# Patient Record
Sex: Female | Born: 1970 | Hispanic: No | Marital: Married | Smoking: Never smoker
Health system: Southern US, Community
[De-identification: ages and names within clinical notes are randomized; demographics above are authoritative.]

## PROBLEM LIST (undated history)

## (undated) DIAGNOSIS — N920 Excessive and frequent menstruation with regular cycle: Secondary | ICD-10-CM

## (undated) DIAGNOSIS — N951 Menopausal and female climacteric states: Secondary | ICD-10-CM

## (undated) DIAGNOSIS — N189 Chronic kidney disease, unspecified: Secondary | ICD-10-CM

## (undated) DIAGNOSIS — Z87442 Personal history of urinary calculi: Secondary | ICD-10-CM

## (undated) DIAGNOSIS — T7840XA Allergy, unspecified, initial encounter: Secondary | ICD-10-CM

## (undated) DIAGNOSIS — F419 Anxiety disorder, unspecified: Secondary | ICD-10-CM

## (undated) HISTORY — PX: WISDOM TOOTH EXTRACTION: SHX21

## (undated) HISTORY — DX: Menopausal and female climacteric states: N95.1

## (undated) HISTORY — DX: Allergy, unspecified, initial encounter: T78.40XA

## (undated) HISTORY — DX: Chronic kidney disease, unspecified: N18.9

## (undated) HISTORY — DX: Excessive and frequent menstruation with regular cycle: N92.0

---

## 2008-03-19 ENCOUNTER — Other Ambulatory Visit: Admission: RE | Admit: 2008-03-19 | Discharge: 2008-03-19 | Payer: Self-pay | Admitting: Obstetrics and Gynecology

## 2009-04-24 ENCOUNTER — Other Ambulatory Visit: Admission: RE | Admit: 2009-04-24 | Discharge: 2009-04-24 | Payer: Self-pay | Admitting: Obstetrics and Gynecology

## 2011-05-12 ENCOUNTER — Other Ambulatory Visit (HOSPITAL_COMMUNITY)
Admission: RE | Admit: 2011-05-12 | Discharge: 2011-05-12 | Disposition: A | Discharge status: Home / Self Care | Payer: BC Managed Care – PPO | Source: Ambulatory Visit | Attending: Obstetrics and Gynecology | Admitting: Obstetrics and Gynecology

## 2011-05-12 DIAGNOSIS — Z01419 Encounter for gynecological examination (general) (routine) without abnormal findings: Secondary | ICD-10-CM | POA: Insufficient documentation

## 2012-06-06 ENCOUNTER — Other Ambulatory Visit (HOSPITAL_COMMUNITY)
Admission: RE | Admit: 2012-06-06 | Discharge: 2012-06-06 | Disposition: A | Discharge status: Home / Self Care | Payer: BC Managed Care – PPO | Source: Ambulatory Visit | Attending: Obstetrics and Gynecology | Admitting: Obstetrics and Gynecology

## 2012-06-06 DIAGNOSIS — Z1151 Encounter for screening for human papillomavirus (HPV): Secondary | ICD-10-CM | POA: Insufficient documentation

## 2012-06-06 DIAGNOSIS — Z01419 Encounter for gynecological examination (general) (routine) without abnormal findings: Secondary | ICD-10-CM | POA: Insufficient documentation

## 2012-12-01 ENCOUNTER — Telehealth: Payer: Self-pay | Admitting: Physician Assistant

## 2012-12-01 NOTE — Telephone Encounter (Signed)
APPT MADE FOR SAT. 

## 2012-12-01 NOTE — Telephone Encounter (Signed)
Patient needs an appointment for a sinus infection.

## 2012-12-03 ENCOUNTER — Encounter: Payer: Self-pay | Admitting: Physician Assistant

## 2012-12-03 ENCOUNTER — Ambulatory Visit (INDEPENDENT_AMBULATORY_CARE_PROVIDER_SITE_OTHER): Payer: BC Managed Care – PPO | Admitting: Physician Assistant

## 2012-12-03 VITALS — BP 124/84 | HR 69 | Temp 98.9°F | Ht 64.0 in | Wt 177.0 lb

## 2012-12-03 DIAGNOSIS — J309 Allergic rhinitis, unspecified: Secondary | ICD-10-CM

## 2012-12-03 MED ORDER — AZITHROMYCIN 250 MG PO TABS: ORAL_TABLET | ORAL | Status: DC

## 2012-12-03 NOTE — Progress Notes (Signed)
  Subjective:    Patient ID: Michelle Sellers, female    DOB: Apr 08, 1971, 42 y.o.   MRN: [id number]  HPI On-going allergy problems over the past 3 years; nasal congestion, ear congestion; switched from allegra to zyrtec, but did not get improvement, resumed using allegra, using nasonex nasal spray once a day   Review of Systems  Constitutional: Positive for fatigue.  HENT: Positive for ear pain, congestion and sinus pressure. Negative for nosebleeds, rhinorrhea, sneezing, postnasal drip and tinnitus.   Eyes: Positive for itching.  All other systems reviewed and are negative.       Objective:   Physical Exam  Vitals reviewed. Constitutional: She appears well-developed and well-nourished.  HENT:  Head: Normocephalic and atraumatic.  Right Ear: External ear normal.  Left Ear: External ear normal.  Mouth/Throat: Oropharynx is clear and moist.  R>L nasal hypertrophy R>L TM retraction  Eyes: Conjunctivae and EOM are normal. Pupils are equal, round, and reactive to light.  Neck: Normal range of motion. Neck supple.  Cardiovascular: Normal rate, regular rhythm and normal heart sounds.   Pulmonary/Chest: Effort normal and breath sounds normal.          Assessment & Plan:  Allergic rhinitis  Orders Placed This Encounter  Procedures  . Ambulatory referral to Allergy    Referral Priority:  Routine    Referral Type:  Allergy Testing    Referral Reason:  Specialty Services Required    Requested Specialty:  Allergy    Number of Visits Requested:  1   Meds ordered this encounter  Medications  . fexofenadine (ALLEGRA) 180 MG tablet    Sig: Take 180 mg by mouth daily.  Marland Kitchen azithromycin (ZITHROMAX) 250 MG tablet    Sig: Take as directed    Dispense:  6 tablet    Refill:  0    Order Specific Question:  Supervising Provider    Answer:  Ernestina Penna [[id number]]

## 2013-06-08 ENCOUNTER — Ambulatory Visit (INDEPENDENT_AMBULATORY_CARE_PROVIDER_SITE_OTHER): Payer: BC Managed Care – PPO

## 2013-06-08 ENCOUNTER — Other Ambulatory Visit: Payer: BC Managed Care – PPO | Admitting: Adult Health

## 2013-06-08 ENCOUNTER — Ambulatory Visit (INDEPENDENT_AMBULATORY_CARE_PROVIDER_SITE_OTHER): Payer: BC Managed Care – PPO | Admitting: Family Medicine

## 2013-06-08 ENCOUNTER — Encounter: Payer: Self-pay | Admitting: Family Medicine

## 2013-06-08 VITALS — BP 129/93 | HR 62 | Temp 97.6°F | Ht 64.0 in | Wt 169.0 lb

## 2013-06-08 DIAGNOSIS — R079 Chest pain, unspecified: Secondary | ICD-10-CM

## 2013-06-08 LAB — POCT GLYCOSYLATED HEMOGLOBIN (HGB A1C): Hemoglobin A1C: 4.7

## 2013-06-08 MED ORDER — GI COCKTAIL ~~LOC~~
30.0000 mL | Freq: Once | ORAL | Status: AC
  Administered 2013-06-08: 30 mL via ORAL

## 2013-06-08 NOTE — Progress Notes (Addendum)
  Subjective:    Patient ID: Michelle Sellers, female    DOB: 1971-02-15, 42 y.o.   MRN: [id number]  HPI Pt presents today with chief complaint of chest pain  Pt reports chest pain x 3 days  CP is constant. L of center of some radiation to neck. No shoulder radiation. No assd nausea or diaphoresis.  Pain 6/10 at its worst.  Sometimes worse with movement. Not associated with exertion.  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  Pt denies any reflux sxs.   CV: family hx/o MI in father in early 57s, otherwise no RFs.     Review of Systems  All other systems reviewed and are negative.       Objective:   Physical Exam  Constitutional: She appears well-developed and well-nourished.  HENT:  Head: Normocephalic and atraumatic.  Eyes: Conjunctivae are normal. Pupils are equal, round, and reactive to light.  Neck: Normal range of motion.  Cardiovascular: Normal rate, regular rhythm and normal heart sounds.   Pulmonary/Chest: Effort normal and breath sounds normal.  + Anterior chest wall TTP    Abdominal: Soft.  Musculoskeletal: Normal range of motion.  Neurological: She is alert.  Skin: Skin is warm.   [facility] reading (PRIMARY) by  Dr. Franki Cabot her chest x-ray read negative for any focal infiltrate abnormality.                                  EKG: NSR        Assessment & Plan:  Chest pain - Plan: EKG 12-Lead, DG Chest 2 View, CBC with Differential, TSH, POCT glycosylated hemoglobin (Hb A1C), Troponin I, NMR Lipoprofile with Lipids, Comprehensive metabolic panel  Symptoms fairly atypical. CXR and EKG WNL. Differential diagnosis includes costochondritis GERD. No relief in symptoms with GI cocktail. Likely costochondritis given reproducible pain with palpation as well as movement. We'll check baseline risk stratification labs given family history of early heart disease. Labs include TSH, A1c, lipid  profile. Check one set of troponins. Discussed general care musculoskeletal red flags. Handout given. Followup as needed.

## 2013-06-08 NOTE — Patient Instructions (Signed)
Costochondritis Costochondritis (Tietze syndrome), or costochondral separation, is a swelling and irritation (inflammation) of the tissue (cartilage) that connects your ribs with your breastbone (sternum). It may occur on its own (spontaneously), through damage caused by an accident (trauma), or simply from coughing or minor exercise. It may take up to 6 weeks to get better and longer if you are unable to be conservative in your activities. HOME CARE INSTRUCTIONS   Avoid exhausting physical activity. Try not to strain your ribs during normal activity. This would include any activities using chest, belly (abdominal), and side muscles, especially if heavy weights are used.  Use ice for 15-20 minutes per hour while awake for the first 2 days. Place the ice in a plastic bag, and place a towel between the bag of ice and your skin.  Only take over-the-counter or prescription medicines for pain, discomfort, or fever as directed by your caregiver. SEEK IMMEDIATE MEDICAL CARE IF:   Your pain increases or you are very uncomfortable.  You have a fever.  You develop difficulty with your breathing.  You cough up blood.  You develop worse chest pains, shortness of breath, sweating, or vomiting.  You develop new, unexplained problems (symptoms). MAKE SURE YOU:   Understand these instructions.  Will watch your condition.  Will get help right away if you are not doing well or get worse. Document Released: 06/03/2005 Document Revised: 11/16/2011 Document Reviewed: 04/11/2008 Exit[employer] Patient Information 2014 [employer].  

## 2013-06-10 LAB — NMR, LIPOPROFILE
HDL Particle Number: 31.6 umol/L (ref >=30.5)
LDL Size: 20.3 nm — ABNORMAL LOW (ref >20.5)
LDLC SERPL CALC-MCNC: 108 mg/dL — ABNORMAL HIGH (ref <100)
LP-IR Score: 45 (ref <=45)
Small LDL Particle Number: 1007 nmol/L — ABNORMAL HIGH (ref <=527)

## 2013-06-10 LAB — CBC WITH DIFFERENTIAL/PLATELET
Eos: 1 %
HCT: 39.6 % (ref 34.0–46.6)
Hemoglobin: 13 g/dL (ref 11.1–15.9)
Lymphocytes Absolute: 2 10*3/uL (ref 0.7–3.1)
MCHC: 32.8 g/dL (ref 31.5–35.7)
Monocytes: 6 %
Neutrophils Absolute: 4.3 10*3/uL (ref 1.4–7.0)
WBC: 6.8 10*3/uL (ref 3.4–10.8)

## 2013-06-10 LAB — TROPONIN I: Troponin I: <0.01 ng/mL (ref 0.00–0.04)

## 2013-06-10 LAB — COMPREHENSIVE METABOLIC PANEL
Albumin: 4.6 g/dL (ref 3.5–5.5)
BUN: 14 mg/dL (ref 6–24)
CO2: 23 mmol/L (ref 18–29)
Calcium: 9.2 mg/dL (ref 8.7–10.2)
Creatinine, Ser: 0.79 mg/dL (ref 0.57–1.00)
Globulin, Total: 2.3 g/dL (ref 1.5–4.5)
Glucose: 80 mg/dL (ref 65–99)

## 2013-06-14 ENCOUNTER — Encounter: Payer: Self-pay | Admitting: *Deleted

## 2013-06-14 NOTE — Progress Notes (Unsigned)
Patient ID: Michelle Sellers, female   DOB: 1971/06/12, 41 y.o.   MRN: [id number] Pt came by today to go over labs, and she wanted bp rck due to elevated bp on 06-08-13. Today was even higher. Can you address and have nurse call the patient at [phone]. Pharm is [facility] [place], [place]  hctz 25 mg called in to pharm on Wednesday the 8th of oct. Per dr Alvester Morin order  Pt to f/u with [facility] this week for complete ck up.- jhb

## 2013-06-15 ENCOUNTER — Ambulatory Visit (INDEPENDENT_AMBULATORY_CARE_PROVIDER_SITE_OTHER): Payer: BC Managed Care – PPO | Admitting: *Deleted

## 2013-06-15 VITALS — BP 118/83 | HR 71

## 2013-06-15 DIAGNOSIS — Z0130 Encounter for examination of blood pressure without abnormal findings: Secondary | ICD-10-CM

## 2013-06-15 DIAGNOSIS — Z136 Encounter for screening for cardiovascular disorders: Secondary | ICD-10-CM

## 2013-06-15 NOTE — Progress Notes (Signed)
Patient ID: Michelle Sellers, female   DOB: [birth year], 42 y.o.   MRN: [id number] Pt came in for bp check Ready was good has appt with [facility] nex week

## 2013-06-21 ENCOUNTER — Ambulatory Visit (INDEPENDENT_AMBULATORY_CARE_PROVIDER_SITE_OTHER): Payer: BC Managed Care – PPO | Admitting: Family Medicine

## 2013-06-21 ENCOUNTER — Encounter: Payer: Self-pay | Admitting: Family Medicine

## 2013-06-21 VITALS — BP 120/88 | HR 67 | Temp 98.1°F | Ht 64.0 in | Wt 170.2 lb

## 2013-06-21 DIAGNOSIS — E785 Hyperlipidemia, unspecified: Secondary | ICD-10-CM

## 2013-06-21 NOTE — Progress Notes (Signed)
  Subjective:    Patient ID: Michelle Sellers, female    DOB: Aug 24, 1971, 42 y.o.   MRN: [id number]  HPI Pt presents today for lab follow up  Pt was recently seen for costochondritis.  Also had risk stratification labs done Had lipoprofile done showing elevated LDL, chol and low HDL.  Pt would like to discuss options for management of cholesterol.  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  Does report eating pizza as well as eating salads with heavy olive oils on a regular basis. Otherwise, pt states that she avoids greasy food.   Also with family hx/o HLD.       Review of Systems  All other systems reviewed and are negative.       Objective:   Physical Exam  Constitutional: She appears well-developed and well-nourished.  HENT:  Head: Normocephalic and atraumatic.  Eyes: Conjunctivae are normal. Pupils are equal, round, and reactive to light.  Neck: Normal range of motion.  Cardiovascular: Normal rate and regular rhythm.   Pulmonary/Chest: Effort normal and breath sounds normal.  Abdominal: Soft.  Musculoskeletal: Normal range of motion.  Neurological: She is alert.  Skin: Skin is warm.          Assessment & Plan:  HLD (hyperlipidemia)  Had a relatively lengthy discusssion with in terms of treatment options.  Would like aggressive diet and lifestyle changes over next 2-3 months with reassessment pending.  Discussed general care and importance of low-fat, plant based diet as well as regular aerobic exercise.  Follow up in 2-3 months.

## 2013-06-21 NOTE — Patient Instructions (Signed)
Hypertriglyceridemia  Diet for High blood levels of Triglycerides Most fats in food are triglycerides. Triglycerides in your blood are stored as fat in your body. High levels of triglycerides in your blood may put you at a greater risk for heart disease and stroke.  Normal triglyceride levels are less than 150 mg/dL. Borderline high levels are 150-199 mg/dl. High levels are 200 - 499 mg/dL, and very high triglyceride levels are greater than 500 mg/dL. The decision to treat high triglycerides is generally based on the level. For people with borderline or high triglyceride levels, treatment includes weight loss and exercise. Drugs are recommended for people with very high triglyceride levels. Many people who need treatment for high triglyceride levels have metabolic syndrome. This syndrome is a collection of disorders that often include: insulin resistance, high blood pressure, blood clotting problems, high cholesterol and triglycerides. TESTING PROCEDURE FOR TRIGLYCERIDES  You should not eat 4 hours before getting your triglycerides measured. The normal range of triglycerides is between 10 and 250 milligrams per deciliter (mg/dl). Some people may have extreme levels (1000 or above), but your triglyceride level may be too high if it is above 150 mg/dl, depending on what other risk factors you have for heart disease.  People with high blood triglycerides may also have high blood cholesterol levels. If you have high blood cholesterol as well as high blood triglycerides, your risk for heart disease is probably greater than if you only had high triglycerides. High blood cholesterol is one of the main risk factors for heart disease. CHANGING YOUR DIET  Your weight can affect your blood triglyceride level. If you are more than 20% above your ideal body weight, you may be able to lower your blood triglycerides by losing weight. Eating less and exercising regularly is the best way to combat this. Fat provides more  calories than any other food. The best way to lose weight is to eat less fat. Only 30% of your total calories should come from fat. Less than 7% of your diet should come from saturated fat. A diet low in fat and saturated fat is the same as a diet to decrease blood cholesterol. By eating a diet lower in fat, you may lose weight, lower your blood cholesterol, and lower your blood triglyceride level.  Eating a diet low in fat, especially saturated fat, may also help you lower your blood triglyceride level. Ask your dietitian to help you figure how much fat you can eat based on the number of calories your caregiver has prescribed for you.  Exercise, in addition to helping with weight loss may also help lower triglyceride levels.   Alcohol can increase blood triglycerides. You may need to stop drinking alcoholic beverages.  Too much carbohydrate in your diet may also increase your blood triglycerides. Some complex carbohydrates are necessary in your diet. These may include bread, rice, potatoes, other starchy vegetables and cereals.  Reduce "simple" carbohydrates. These may include pure sugars, candy, honey, and jelly without losing other nutrients. If you have the kind of high blood triglycerides that is affected by the amount of carbohydrates in your diet, you will need to eat less sugar and less high-sugar foods. Your caregiver can help you with this.  Adding 2-4 grams of fish oil (EPA+ DHA) may also help lower triglycerides. Speak with your caregiver before adding any supplements to your regimen. Following the Diet  Maintain your ideal weight. Your caregivers can help you with a diet. Generally, eating less food and getting more   exercise will help you lose weight. Joining a weight control group may also help. Ask your caregivers for a good weight control group in your area.  Eat low-fat foods instead of high-fat foods. This can help you lose weight too.  These foods are lower in fat. Eat MORE of these:    Dried beans, peas, and lentils.  Egg whites.  Low-fat cottage cheese.  Fish.  Lean cuts of meat, such as round, sirloin, rump, and flank (cut extra fat off meat you fix).  Whole grain breads, cereals and pasta.  Skim and nonfat dry milk.  Low-fat yogurt.  Poultry without the skin.  Cheese made with skim or part-skim milk, such as mozzarella, parmesan, farmers', ricotta, or pot cheese. These are higher fat foods. Eat LESS of these:   Whole milk and foods made from whole milk, such as American, blue, cheddar, monterey jack, and swiss cheese  High-fat meats, such as luncheon meats, sausages, knockwurst, bratwurst, hot dogs, ribs, corned beef, ground pork, and regular ground beef.  Fried foods. Limit saturated fats in your diet. Substituting unsaturated fat for saturated fat may decrease your blood triglyceride level. You will need to read package labels to know which products contain saturated fats.  These foods are high in saturated fat. Eat LESS of these:   Fried pork skins.  Whole milk.  Skin and fat from poultry.  Palm oil.  Butter.  Shortening.  Cream cheese.  Bacon.  Margarines and baked goods made from listed oils.  Vegetable shortenings.  Chitterlings.  Fat from meats.  Coconut oil.  Palm kernel oil.  Lard.  Cream.  Sour cream.  Fatback.  Coffee whiteners and non-dairy creamers made with these oils.  Cheese made from whole milk. Use unsaturated fats (both polyunsaturated and monounsaturated) moderately. Remember, even though unsaturated fats are better than saturated fats; you still want a diet low in total fat.  These foods are high in unsaturated fat:   Canola oil.  Sunflower oil.  Mayonnaise.  Almonds.  Peanuts.  Pine nuts.  Margarines made with these oils.  Safflower oil.  Olive oil.  Avocados.  Cashews.  Peanut butter.  Sunflower seeds.  Soybean oil.  Peanut  oil.  Olives.  Pecans.  Walnuts.  Pumpkin seeds. Avoid sugar and other high-sugar foods. This will decrease carbohydrates without decreasing other nutrients. Sugar in your food goes rapidly to your blood. When there is excess sugar in your blood, your liver may use it to make more triglycerides. Sugar also contains calories without other important nutrients.  Eat LESS of these:   Sugar, brown sugar, powdered sugar, jam, jelly, preserves, honey, syrup, molasses, pies, candy, cakes, cookies, frosting, pastries, colas, soft drinks, punches, fruit drinks, and regular gelatin.  Avoid alcohol. Alcohol, even more than sugar, may increase blood triglycerides. In addition, alcohol is high in calories and low in nutrients. Ask for sparkling water, or a diet soft drink instead of an alcoholic beverage. Suggestions for planning and preparing meals   Bake, broil, grill or roast meats instead of frying.  Remove fat from meats and skin from poultry before cooking.  Add spices, herbs, lemon juice or vinegar to vegetables instead of salt, rich sauces or gravies.  Use a non-stick skillet without fat or use no-stick sprays.  Cool and refrigerate stews and broth. Then remove the hardened fat floating on the surface before serving.  Refrigerate meat drippings and skim off fat to make low-fat gravies.  Serve more fish.  Use less butter,   margarine and other high-fat spreads on bread or vegetables.  Use skim or reconstituted non-fat dry milk for cooking.  Cook with low-fat cheeses.  Substitute low-fat yogurt or cottage cheese for all or part of the sour cream in recipes for sauces, dips or congealed salads.  Use half yogurt/half mayonnaise in salad recipes.  Substitute evaporated skim milk for cream. Evaporated skim milk or reconstituted non-fat dry milk can be whipped and substituted for whipped cream in certain recipes.  Choose fresh fruits for dessert instead of high-fat foods such as pies or  cakes. Fruits are naturally low in fat. When Dining Out   Order low-fat appetizers such as fruit or vegetable juice, pasta with vegetables or tomato sauce.  Select clear, rather than cream soups.  Ask that dressings and gravies be served on the side. Then use less of them.  Order foods that are baked, broiled, poached, steamed, stir-fried, or roasted.  Ask for margarine instead of butter, and use only a small amount.  Drink sparkling water, unsweetened tea or coffee, or diet soft drinks instead of alcohol or other sweet beverages. QUESTIONS AND ANSWERS ABOUT OTHER FATS IN THE BLOOD: SATURATED FAT, TRANS FAT, AND CHOLESTEROL What is trans fat? Trans fat is a type of fat that is formed when vegetable oil is hardened through a process called hydrogenation. This process helps makes foods more solid, gives them shape, and prolongs their shelf life. Trans fats are also called hydrogenated or partially hydrogenated oils.  What do saturated fat, trans fat, and cholesterol in foods have to do with heart disease? Saturated fat, trans fat, and cholesterol in the diet all raise the level of LDL "bad" cholesterol in the blood. The higher the LDL cholesterol, the greater the risk for coronary heart disease (CHD). Saturated fat and trans fat raise LDL similarly.  What foods contain saturated fat, trans fat, and cholesterol? High amounts of saturated fat are found in animal products, such as fatty cuts of meat, chicken skin, and full-fat dairy products like butter, whole milk, cream, and cheese, and in tropical vegetable oils such as palm, palm kernel, and coconut oil. Trans fat is found in some of the same foods as saturated fat, such as vegetable shortening, some margarines (especially hard or stick margarine), crackers, cookies, baked goods, fried foods, salad dressings, and other processed foods made with partially hydrogenated vegetable oils. Small amounts of trans fat also occur naturally in some animal  products, such as milk products, beef, and lamb. Foods high in cholesterol include liver, other organ meats, egg yolks, shrimp, and full-fat dairy products. How can I use the new food label to make heart-healthy food choices? Check the Nutrition Facts panel of the food label. Choose foods lower in saturated fat, trans fat, and cholesterol. For saturated fat and cholesterol, you can also use the Percent Daily Value (%DV): 5% DV or less is low, and 20% DV or more is high. (There is no %DV for trans fat.) Use the Nutrition Facts panel to choose foods low in saturated fat and cholesterol, and if the trans fat is not listed, read the ingredients and limit products that list shortening or hydrogenated or partially hydrogenated vegetable oil, which tend to be high in trans fat. POINTS TO REMEMBER:   Discuss your risk for heart disease with your caregivers, and take steps to reduce risk factors.  Change your diet. Choose foods that are low in saturated fat, trans fat, and cholesterol.  Add exercise to your daily routine if   it is not already being done. Participate in physical activity of moderate intensity, like brisk walking, for at least 30 minutes on most, and preferably all days of the week. No time? Break the 30 minutes into three, 10-minute segments during the day.  Stop smoking. If you do smoke, contact your caregiver to discuss ways in which they can help you quit.  Do not use street drugs.  Maintain a normal weight.  Maintain a healthy blood pressure.  Keep up with your blood work for checking the fats in your blood as directed by your caregiver. Document Released: 06/11/2004 Document Revised: 02/23/2012 Document Reviewed: 01/07/2009 [employer] Patient Information 2014 [employer].  

## 2013-07-04 ENCOUNTER — Other Ambulatory Visit: Payer: BC Managed Care – PPO | Admitting: Advanced Practice Midwife

## 2013-07-17 ENCOUNTER — Ambulatory Visit (INDEPENDENT_AMBULATORY_CARE_PROVIDER_SITE_OTHER): Payer: BC Managed Care – PPO | Admitting: Family Medicine

## 2013-07-17 ENCOUNTER — Encounter: Payer: Self-pay | Admitting: Family Medicine

## 2013-07-17 VITALS — BP 134/89 | HR 67 | Temp 99.3°F | Ht 64.0 in | Wt 168.8 lb

## 2013-07-17 DIAGNOSIS — J069 Acute upper respiratory infection, unspecified: Secondary | ICD-10-CM

## 2013-07-17 DIAGNOSIS — R300 Dysuria: Secondary | ICD-10-CM

## 2013-07-17 LAB — POCT URINALYSIS DIPSTICK
Bilirubin, UA: NEGATIVE
Glucose, UA: NEGATIVE
Ketones, UA: NEGATIVE
Nitrite, UA: NEGATIVE
Protein, UA: NEGATIVE
Spec Grav, UA: 1.015
Urobilinogen, UA: NEGATIVE
pH, UA: 7

## 2013-07-17 LAB — POCT UA - MICROSCOPIC ONLY
Bacteria, U Microscopic: NEGATIVE
Casts, Ur, LPF, POC: NEGATIVE
Crystals, Ur, HPF, POC: NEGATIVE
Mucus, UA: NEGATIVE
Yeast, UA: NEGATIVE

## 2013-07-17 MED ORDER — SULFAMETHOXAZOLE-TMP DS 800-160 MG PO TABS: 1.0000 | ORAL_TABLET | Freq: Two times a day (BID) | ORAL | Status: DC

## 2013-07-17 NOTE — Progress Notes (Signed)
  Subjective:    Patient ID: Michelle Sellers, female    DOB: November 29, 1970, 42 y.o.   MRN: [id number]  HPI  This 42 y.o. female presents for evaluation of dysuria for a couple days.  She is having Back pain and she is having some sinus drainage.  She reports being dizzy.  She takes Allegra daily and she states it is not helping..  Review of Systems    No chest pain, SOB, HA, dizziness, vision change, N/V, diarrhea, constipation, dysuria, urinary urgency or frequency, myalgias, arthralgias or rash.  Objective:   Physical Exam  Vital signs noted  Well developed well nourished female.  HEENT - Head atraumatic Normocephalic                Eyes - PERRLA, Conjuctiva - clear Sclera- Clear EOMI                Ears - EAC's Wnl TM's Wnl Gross Hearing WNL                Nose - Nares patent                 Throat - oropharanx wnl Respiratory - Lungs CTA bilateral Cardiac - RRR S1 and S2 without murmur GI - Abdomen soft Nontender and bowel sounds active x 4 Extremities - No edema. Neuro - Grossly intact.      Assessment & Plan:  Dysuria - Plan: POCT UA - Microscopic Only, POCT urinalysis dipstick, sulfamethoxazole-trimethoprim (BACTRIM DS) 800-160 MG per tablet  URI, acute - Plan: sulfamethoxazole-trimethoprim (BACTRIM DS) 800-160 MG per tablet  Deatra Canter FNP

## 2013-07-17 NOTE — Patient Instructions (Signed)

## 2013-08-02 ENCOUNTER — Other Ambulatory Visit (HOSPITAL_COMMUNITY)
Admission: RE | Admit: 2013-08-02 | Discharge: 2013-08-02 | Disposition: A | Discharge status: Home / Self Care | Payer: BC Managed Care – PPO | Source: Ambulatory Visit | Attending: Advanced Practice Midwife | Admitting: Advanced Practice Midwife

## 2013-08-02 ENCOUNTER — Ambulatory Visit (INDEPENDENT_AMBULATORY_CARE_PROVIDER_SITE_OTHER): Payer: BC Managed Care – PPO | Admitting: Advanced Practice Midwife

## 2013-08-02 ENCOUNTER — Encounter: Payer: Self-pay | Admitting: Advanced Practice Midwife

## 2013-08-02 ENCOUNTER — Encounter (INDEPENDENT_AMBULATORY_CARE_PROVIDER_SITE_OTHER): Payer: Self-pay

## 2013-08-02 VITALS — BP 114/84 | Ht 64.0 in | Wt 171.0 lb

## 2013-08-02 DIAGNOSIS — I10 Essential (primary) hypertension: Secondary | ICD-10-CM | POA: Insufficient documentation

## 2013-08-02 DIAGNOSIS — Z1151 Encounter for screening for human papillomavirus (HPV): Secondary | ICD-10-CM | POA: Insufficient documentation

## 2013-08-02 DIAGNOSIS — Z1212 Encounter for screening for malignant neoplasm of rectum: Secondary | ICD-10-CM

## 2013-08-02 DIAGNOSIS — Z01419 Encounter for gynecological examination (general) (routine) without abnormal findings: Secondary | ICD-10-CM | POA: Insufficient documentation

## 2013-08-02 DIAGNOSIS — E785 Hyperlipidemia, unspecified: Secondary | ICD-10-CM | POA: Insufficient documentation

## 2013-08-02 NOTE — Progress Notes (Signed)
Michelle Sellers 42 y.o.  Filed Vitals:   08/02/13 0938  BP: 114/84     Past Medical History: Past Medical History  Diagnosis Date  . Allergy     Past Surgical History: History reviewed. No pertinent past surgical history.  Family History: Family History  Problem Relation Age of Onset  . Hyperlipidemia Mother   . Hypertension Mother   . Heart disease Father   . Hyperlipidemia Father   . Hypertension Father     Social History: History  Substance Use Topics  . Smoking status: Never Smoker   . Smokeless tobacco: Not on file  . Alcohol Use: 0.6 oz/week    1 Glasses of wine per week    Allergies: No Known Allergies     Current outpatient prescriptions:Cholecalciferol (VITAMIN D PO), Take by mouth 1 day or 1 dose., Disp: , Rfl: ;  Omega-3 Fatty Acids (FISH OIL PO), Take by mouth 1 day or 1 dose., Disp: , Rfl: ;  hydrochlorothiazide (HYDRODIURIL) 25 MG tablet, Take 25 mg by mouth daily., Disp: , Rfl: ;  sulfamethoxazole-trimethoprim (BACTRIM DS) 800-160 MG per tablet, Take 1 tablet by mouth 2 (two) times daily., Disp: 20 tablet, Rfl: 0  History of Present Illness:  Was placed on HCTZ by PCP for 2 elevated b/p's, but pt hasn't taken it.  BP normal today.  Mammograms normal q year   Review of Systems   Patient denies any headaches, blurred vision, shortness of breath, chest pain, abdominal pain, problems with bowel movements, urination, or intercourse.   Physical Exam: General:  Well developed, well nourished, no acute distress Skin:  Warm and dry Neck:  Midline trachea, normal thyroid Lungs; Clear to auscultation bilaterally Breast:  No dominant palpable mass, retraction, or nipple discharge Cardiovascular: Regular rate and rhythm Abdomen:  Soft, non tender, no hepatosplenomegaly Pelvic:  External genitalia is normal in appearance.  The vagina is normal in appearance.               The cervix is bulbous.  Uterus is felt to be normal size, shape, and contour.   No                adnexal masses or tenderness noted. Rectal: Good sphincter tone, no polyps, or hemorrhoids felt. Occult negative  Extremities:  No swelling or varicosities noted Psych:  No mood changes   Impression:  Normal GYN exam   Plan:  Pap q 3 years if normal. COntinue yearly mammograms

## 2013-08-02 NOTE — Progress Notes (Deleted)
Michelle Sellers 42 y.o.  Filed Vitals:   08/02/13 0938  BP: 114/84     Past Medical History: Past Medical History  Diagnosis Date  . Allergy     Past Surgical History: History reviewed. No pertinent past surgical history.  Family History: Family History  Problem Relation Age of Onset  . Hyperlipidemia Mother   . Hypertension Mother   . Heart disease Father   . Hyperlipidemia Father   . Hypertension Father     Social History: History  Substance Use Topics  . Smoking status: Never Smoker   . Smokeless tobacco: Not on file  . Alcohol Use: 0.6 oz/week    1 Glasses of wine per week    Allergies: No Known Allergies   History of Present Illness:    Current reviewed in [facility] electronic medical record.     Review of Systems   Patient denies any headaches, blurred vision, shortness of breath, chest pain, abdominal pain, problems with bowel movements, urination, or intercourse.   Physical Exam: General:  Well developed, well nourished, no acute distress Skin:  Warm and dry Neck:  Midline trachea, normal thyroid Lungs; Clear to auscultation bilaterally Breast:  No dominant palpable mass, retraction, or nipple discharge Cardiovascular: Regular rate and rhythm Abdomen:  Soft, non tender, no hepatosplenomegaly Pelvic:  External genitalia is normal in appearance.  The vagina is normal in appearance. The cervix is bulbous.  Uterus is felt to be normal size, shape, and contour.  No  adnexal masses or tenderness noted. Rectal: Good sphincter tone, no polyps, or hemorrhoids felt.   Extremities:  No swelling or varicosities noted Psych:  No mood changes   Impression:     Plan:

## 2013-08-22 ENCOUNTER — Other Ambulatory Visit: Payer: BC Managed Care – PPO

## 2013-09-22 ENCOUNTER — Other Ambulatory Visit (INDEPENDENT_AMBULATORY_CARE_PROVIDER_SITE_OTHER): Payer: BC Managed Care – PPO

## 2013-09-22 DIAGNOSIS — E785 Hyperlipidemia, unspecified: Secondary | ICD-10-CM

## 2013-09-22 DIAGNOSIS — I10 Essential (primary) hypertension: Secondary | ICD-10-CM

## 2013-09-22 NOTE — Progress Notes (Signed)
Patient came in for labs only.

## 2013-09-24 LAB — CMP14+EGFR
A/G RATIO: 2.1 (ref 1.1–2.5)
ALT: 9 IU/L (ref 0–32)
AST: 13 IU/L (ref 0–40)
Albumin: 4.5 g/dL (ref 3.5–5.5)
Alkaline Phosphatase: 61 IU/L (ref 39–117)
BUN/Creatinine Ratio: 25 — ABNORMAL HIGH (ref 9–23)
BUN: 19 mg/dL (ref 6–24)
CO2: 24 mmol/L (ref 18–29)
CREATININE: 0.77 mg/dL (ref 0.57–1.00)
Calcium: 9.3 mg/dL (ref 8.7–10.2)
Chloride: 103 mmol/L (ref 97–108)
GFR calc Af Amer: 109 mL/min/{1.73_m2} (ref >59)
GFR, EST NON AFRICAN AMERICAN: 94 mL/min/{1.73_m2} (ref >59)
GLOBULIN, TOTAL: 2.1 g/dL (ref 1.5–4.5)
GLUCOSE: 94 mg/dL (ref 65–99)
Potassium: 4.4 mmol/L (ref 3.5–5.2)
Sodium: 140 mmol/L (ref 134–144)
TOTAL PROTEIN: 6.6 g/dL (ref 6.0–8.5)
Total Bilirubin: 0.6 mg/dL (ref 0.0–1.2)

## 2013-09-24 LAB — NMR, LIPOPROFILE
Cholesterol: 187 mg/dL (ref <200)
HDL CHOLESTEROL BY NMR: 40 mg/dL (ref >=40)
HDL PARTICLE NUMBER: 31.2 umol/L (ref >=30.5)
LDL Particle Number: 2008 nmol/L — ABNORMAL HIGH (ref <1000)
LDL Size: 19.9 nm — ABNORMAL LOW (ref >20.5)
LDLC SERPL CALC-MCNC: 112 mg/dL — AB (ref <100)
LP-IR Score: 67 — ABNORMAL HIGH (ref <=45)
SMALL LDL PARTICLE NUMBER: 1426 nmol/L — AB (ref <=527)
TRIGLYCERIDES BY NMR: 174 mg/dL — AB (ref <150)

## 2013-09-26 ENCOUNTER — Other Ambulatory Visit: Payer: Self-pay | Admitting: Family Medicine

## 2013-09-26 DIAGNOSIS — E785 Hyperlipidemia, unspecified: Secondary | ICD-10-CM

## 2013-09-26 MED ORDER — ROSUVASTATIN CALCIUM 40 MG PO TABS: 40.0000 mg | ORAL_TABLET | Freq: Every day | ORAL | Status: DC

## 2013-09-27 ENCOUNTER — Ambulatory Visit: Payer: BC Managed Care – PPO | Admitting: Family Medicine

## 2013-09-28 ENCOUNTER — Ambulatory Visit: Payer: BC Managed Care – PPO | Admitting: Nurse Practitioner

## 2013-10-04 ENCOUNTER — Ambulatory Visit: Payer: BC Managed Care – PPO | Admitting: Family Medicine

## 2013-10-10 ENCOUNTER — Ambulatory Visit: Payer: BC Managed Care – PPO | Admitting: Family Medicine

## 2013-10-16 ENCOUNTER — Ambulatory Visit (INDEPENDENT_AMBULATORY_CARE_PROVIDER_SITE_OTHER): Payer: BC Managed Care – PPO | Admitting: Family Medicine

## 2013-10-16 ENCOUNTER — Encounter: Payer: Self-pay | Admitting: Family Medicine

## 2013-10-16 VITALS — BP 133/91 | HR 64 | Temp 97.2°F | Ht 64.0 in | Wt 174.8 lb

## 2013-10-16 DIAGNOSIS — R599 Enlarged lymph nodes, unspecified: Secondary | ICD-10-CM

## 2013-10-16 DIAGNOSIS — E785 Hyperlipidemia, unspecified: Secondary | ICD-10-CM

## 2013-10-16 DIAGNOSIS — R590 Localized enlarged lymph nodes: Secondary | ICD-10-CM

## 2013-10-16 MED ORDER — AZITHROMYCIN 250 MG PO TABS: ORAL_TABLET | ORAL | Status: DC

## 2013-10-16 NOTE — Progress Notes (Signed)
   Subjective:    Patient ID: Michelle Sellers, female    DOB: 1970/10/25, 43 y.o.   MRN: [id number]  HPI This 43 y.o. female presents for evaluation of hyperlipidemia.  She has elevated LDL particles and She has been recommended to start Crestor.  She is given crestor samples.  She is noticing a lymph node sticking out on her left lateral neck.  She c/o allergies.   Review of Systems C/o LAD  No chest pain, SOB, HA, dizziness, vision change, N/V, diarrhea, constipation, dysuria, urinary urgency or frequency, myalgias, arthralgias or rash.     Objective:   Physical Exam  Vital signs noted  Well developed well nourished female.  HEENT - Head atraumatic Normocephalic                Eyes - PERRLA, Conjuctiva - clear Sclera- Clear EOMI                Ears - EAC's Wnl TM's Wnl Gross Hearing WNL                Nose - Nares patent                 Throat - oropharanx wnl                Neck -Posterior left cervical column with shotty lymph node Respiratory - Lungs CTA bilateral Cardiac - RRR S1 and S2 without murmur      Assessment & Plan:  LAD (lymphadenopathy), cervical - Plan: azithromycin (ZITHROMAX) 250 MG tablet Recommend follow up in 2 weeks.  Other and unspecified hyperlipidemia - Crestor 20 mg po qd #30 samples given.  Follow up in 2 weeks to discuss.  Deatra CanterWilliam J Oxford FNP

## 2014-04-13 ENCOUNTER — Encounter: Payer: Self-pay | Admitting: Nurse Practitioner

## 2014-04-13 ENCOUNTER — Ambulatory Visit (INDEPENDENT_AMBULATORY_CARE_PROVIDER_SITE_OTHER): Payer: BC Managed Care – PPO | Admitting: Nurse Practitioner

## 2014-04-13 VITALS — BP 138/99 | HR 70 | Temp 97.5°F | Ht 64.0 in | Wt 180.6 lb

## 2014-04-13 DIAGNOSIS — W57XXXA Bitten or stung by nonvenomous insect and other nonvenomous arthropods, initial encounter: Secondary | ICD-10-CM

## 2014-04-13 DIAGNOSIS — IMO0001 Reserved for inherently not codable concepts without codable children: Secondary | ICD-10-CM

## 2014-04-13 MED ORDER — SULFAMETHOXAZOLE-TMP DS 800-160 MG PO TABS: 1.0000 | ORAL_TABLET | Freq: Two times a day (BID) | ORAL | Status: DC

## 2014-04-13 NOTE — Progress Notes (Signed)
   Subjective:    Patient ID: Michelle Sellers, female    DOB: October 18, 1970, 43 y.o.   MRN: [id number]  HPI Patient got bitten by something on the beach yesterday- now it is red and swollen and sore to the touch.    Review of Systems  Constitutional: Negative.   HENT: Negative.   Respiratory: Negative.   Cardiovascular: Negative.   Genitourinary: Negative.   Neurological: Negative.   Psychiatric/Behavioral: Negative.   All other systems reviewed and are negative.      Objective:   Physical Exam  Constitutional: She is oriented to person, place, and time. She appears well-developed and well-nourished.  Cardiovascular: Normal rate, regular rhythm and normal heart sounds.   Pulmonary/Chest: Effort normal and breath sounds normal.  Neurological: She is alert and oriented to person, place, and time.  Skin: Skin is warm.  2cm erythematous raised lesion with central pustule on lateral side of right foot- mild edema- tender to touch  Psychiatric: She has a normal mood and affect. Her behavior is normal. Judgment and thought content normal.   BP 138/99  Pulse 70  Temp(Src) 97.5 F (36.4 C) (Oral)  Ht 5\' 4"  (1.626 m)  Wt 180 lb 9.6 oz (81.92 kg)  BMI 30.98 kg/m2        Assessment & Plan:   1. Bug bite with infection    Meds ordered this encounter  Medications  . fexofenadine (ALLEGRA) 180 MG tablet    Sig: Take 180 mg by mouth daily.  Marland Kitchen. sulfamethoxazole-trimethoprim (BACTRIM DS) 800-160 MG per tablet    Sig: Take 1 tablet by mouth 2 (two) times daily.    Dispense:  20 tablet    Refill:  0    Order Specific Question:  Supervising Provider    Answer:  Deborra MedinaMOORE, DONALD W [[id number]]   Clean with antibacterial soap daily  Do not pick or scratch RTO prn  Mary-Margaret Daphine DeutscherMartin, FNP

## 2014-04-13 NOTE — Patient Instructions (Signed)

## 2014-06-01 ENCOUNTER — Ambulatory Visit (INDEPENDENT_AMBULATORY_CARE_PROVIDER_SITE_OTHER): Payer: BC Managed Care – PPO | Admitting: Family Medicine

## 2014-06-01 ENCOUNTER — Encounter: Payer: Self-pay | Admitting: Family Medicine

## 2014-06-01 VITALS — BP 137/96 | HR 73 | Temp 98.4°F | Ht 64.0 in | Wt 175.0 lb

## 2014-06-01 DIAGNOSIS — R599 Enlarged lymph nodes, unspecified: Secondary | ICD-10-CM

## 2014-06-01 DIAGNOSIS — R590 Localized enlarged lymph nodes: Secondary | ICD-10-CM

## 2014-06-01 DIAGNOSIS — R51 Headache: Secondary | ICD-10-CM

## 2014-06-01 MED ORDER — BUTALBITAL-APAP-CAFFEINE 50-325-40 MG PO TABS: 1.0000 | ORAL_TABLET | Freq: Four times a day (QID) | ORAL | Status: DC | PRN

## 2014-06-01 MED ORDER — METHYLPREDNISOLONE (PAK) 4 MG PO TABS: ORAL_TABLET | ORAL | Status: DC

## 2014-06-01 MED ORDER — AZITHROMYCIN 250 MG PO TABS: ORAL_TABLET | ORAL | Status: DC

## 2014-06-01 NOTE — Progress Notes (Signed)
   Subjective:    Patient ID: Michelle Sellers, female    DOB: 06-23-1971, 42 y.o.   MRN: [id number]  HPI  C/o uri sx's and headache  Review of Systems No chest pain, SOB, HA, dizziness, vision change, N/V, diarrhea, constipation, dysuria, urinary urgency or frequency, myalgias, arthralgias or rash.     Objective:   Physical Exam Vital signs noted  Well developed well nourished female.  HEENT - Head atraumatic Normocephalic                Eyes - PERRLA, Conjuctiva - clear Sclera- Clear EOMI                Ears - EAC's Wnl TM's Wnl Gross Hearing WNL                Nose - Nares patent                 Throat - oropharanx wnl Respiratory - Lungs CTA bilateral Cardiac - RRR S1 and S2 without murmur GI - Abdomen soft Nontender and bowel sounds active x 4         Assessment & Plan:  LAD (lymphadenopathy), cervical - Plan: azithromycin (ZITHROMAX) 250 MG tablet  Headache(784.0) - Plan: methylPREDNIsolone (MEDROL DOSPACK) 4 MG tablet, butalbital-acetaminophen-caffeine (FIORICET) 50-325-40 MG per tablet  Deatra Canter FNP

## 2014-06-06 ENCOUNTER — Other Ambulatory Visit (INDEPENDENT_AMBULATORY_CARE_PROVIDER_SITE_OTHER): Payer: BC Managed Care – PPO

## 2014-06-06 ENCOUNTER — Encounter (INDEPENDENT_AMBULATORY_CARE_PROVIDER_SITE_OTHER): Payer: Self-pay

## 2014-06-06 ENCOUNTER — Telehealth: Payer: Self-pay | Admitting: Nurse Practitioner

## 2014-06-06 DIAGNOSIS — E785 Hyperlipidemia, unspecified: Secondary | ICD-10-CM

## 2014-06-06 DIAGNOSIS — I10 Essential (primary) hypertension: Secondary | ICD-10-CM

## 2014-06-06 DIAGNOSIS — E559 Vitamin D deficiency, unspecified: Secondary | ICD-10-CM

## 2014-06-06 LAB — POCT CBC
Granulocyte percent: 64.3 %G (ref 37–80)
HEMATOCRIT: 38.4 % (ref 37.7–47.9)
HEMOGLOBIN: 13 g/dL (ref 12.2–16.2)
Lymph, poc: 3.3 (ref 0.6–3.4)
MCH: 29.6 pg (ref 27–31.2)
MCHC: 33.9 g/dL (ref 31.8–35.4)
MCV: 87.4 fL (ref 80–97)
MPV: 8.9 fL (ref 0–99.8)
POC Granulocyte: 6 (ref 2–6.9)
POC LYMPH PERCENT: 34.9 %L (ref 10–50)
Platelet Count, POC: 218 10*3/uL (ref 142–424)
RBC: 4.4 M/uL (ref 4.04–5.48)
RDW, POC: 13.1 %
WBC: 9.4 10*3/uL (ref 4.6–10.2)

## 2014-06-06 NOTE — Progress Notes (Signed)
Lab only 

## 2014-06-07 LAB — CMP14+EGFR
A/G RATIO: 1.6 (ref 1.1–2.5)
ALBUMIN: 4.1 g/dL (ref 3.5–5.5)
ALK PHOS: 52 IU/L (ref 39–117)
ALT: 6 IU/L (ref 0–32)
AST: 8 IU/L (ref 0–40)
BUN / CREAT RATIO: 24 — AB (ref 9–23)
BUN: 20 mg/dL (ref 6–24)
CO2: 24 mmol/L (ref 18–29)
Calcium: 9.1 mg/dL (ref 8.7–10.2)
Chloride: 103 mmol/L (ref 97–108)
Creatinine, Ser: 0.85 mg/dL (ref 0.57–1.00)
GFR calc Af Amer: 96 mL/min/{1.73_m2} (ref >59)
GFR, EST NON AFRICAN AMERICAN: 84 mL/min/{1.73_m2} (ref >59)
GLUCOSE: 85 mg/dL (ref 65–99)
Globulin, Total: 2.5 g/dL (ref 1.5–4.5)
Potassium: 4.5 mmol/L (ref 3.5–5.2)
Sodium: 141 mmol/L (ref 134–144)
TOTAL PROTEIN: 6.6 g/dL (ref 6.0–8.5)
Total Bilirubin: 0.3 mg/dL (ref 0.0–1.2)

## 2014-06-07 LAB — NMR, LIPOPROFILE
CHOLESTEROL: 168 mg/dL (ref 100–199)
HDL Cholesterol by NMR: 36 mg/dL — ABNORMAL LOW (ref >39)
HDL Particle Number: 29.4 umol/L — ABNORMAL LOW (ref >=30.5)
LDL PARTICLE NUMBER: 1605 nmol/L — AB (ref <1000)
LDL Size: 20 nm (ref >20.5)
LDLC SERPL CALC-MCNC: 105 mg/dL — ABNORMAL HIGH (ref 0–99)
LP-IR SCORE: 56 — AB (ref <=45)
SMALL LDL PARTICLE NUMBER: 1161 nmol/L — AB (ref <=527)
Triglycerides by NMR: 134 mg/dL (ref 0–149)

## 2014-06-07 LAB — VITAMIN D 25 HYDROXY (VIT D DEFICIENCY, FRACTURES): Vit D, 25-Hydroxy: 14.3 ng/mL — ABNORMAL LOW (ref 30.0–100.0)

## 2014-06-07 NOTE — Telephone Encounter (Signed)
appt scheduled

## 2014-06-14 ENCOUNTER — Encounter: Payer: Self-pay | Admitting: Nurse Practitioner

## 2014-06-14 ENCOUNTER — Ambulatory Visit (INDEPENDENT_AMBULATORY_CARE_PROVIDER_SITE_OTHER): Payer: BC Managed Care – PPO | Admitting: Nurse Practitioner

## 2014-06-14 VITALS — BP 129/92 | HR 71 | Temp 98.4°F | Ht 64.0 in | Wt 174.6 lb

## 2014-06-14 DIAGNOSIS — E785 Hyperlipidemia, unspecified: Secondary | ICD-10-CM

## 2014-06-14 DIAGNOSIS — I10 Essential (primary) hypertension: Secondary | ICD-10-CM | POA: Insufficient documentation

## 2014-06-14 MED ORDER — HYDROCHLOROTHIAZIDE 12.5 MG PO CAPS: 12.5000 mg | ORAL_CAPSULE | Freq: Every day | ORAL | Status: DC

## 2014-06-14 NOTE — Patient Instructions (Signed)
Stress and Stress Management Stress is a normal reaction to life events. It is what you feel when life demands more than you are used to or more than you can handle. Some stress can be useful. For example, the stress reaction can help you catch the last bus of the day, study for a test, or meet a deadline at work. But stress that occurs too often or for too long can cause problems. It can affect your emotional health and interfere with relationships and normal daily activities. Too much stress can weaken your immune system and increase your risk for physical illness. If you already have a medical problem, stress can make it worse. CAUSES  All sorts of life events may cause stress. An event that causes stress for one person may not be stressful for another person. Major life events commonly cause stress. These may be positive or negative. Examples include losing your job, moving into a new home, getting married, having a baby, or losing a loved one. Less obvious life events may also cause stress, especially if they occur day after day or in combination. Examples include working long hours, driving in traffic, caring for children, being in debt, or being in a difficult relationship. SIGNS AND SYMPTOMS Stress may cause emotional symptoms including, the following:  Anxiety. This is feeling worried, afraid, on edge, overwhelmed, or out of control.  Anger. This is feeling irritated or impatient.  Depression. This is feeling sad, down, helpless, or guilty.  Difficulty focusing, remembering, or making decisions. Stress may cause physical symptoms, including the following:   Aches and pains. These may affect your head, neck, back, stomach, or other areas of your body.  Tight muscles or clenched jaw.  Low energy or trouble sleeping. Stress may cause unhealthy behaviors, including the following:   Eating to feel better (overeating) or skipping meals.  Sleeping too little, too much, or both.  Working  too much or putting off tasks (procrastination).  Smoking, drinking alcohol, or using drugs to feel better. DIAGNOSIS  Stress is diagnosed through an assessment by your health care provider. Your health care provider will ask questions about your symptoms and any stressful life events.Your health care provider will also ask about your medical history and may order blood tests or other tests. Certain medical conditions and medicine can cause physical symptoms similar to stress. Mental illness can cause emotional symptoms and unhealthy behaviors similar to stress. Your health care provider may refer you to a mental health professional for further evaluation.  TREATMENT  Stress management is the recommended treatment for stress.The goals of stress management are reducing stressful life events and coping with stress in healthy ways.  Techniques for reducing stressful life events include the following:  Stress identification. Self-monitor for stress and identify what causes stress for you. These skills may help you to avoid some stressful events.  Time management. Set your priorities, keep a calendar of events, and learn to say "no." These tools can help you avoid making too many commitments. Techniques for coping with stress include the following:  Rethinking the problem. Try to think realistically about stressful events rather than ignoring them or overreacting. Try to find the positives in a stressful situation rather than focusing on the negatives.  Exercise. Physical exercise can release both physical and emotional tension. The key is to find a form of exercise you enjoy and do it regularly.  Relaxation techniques. These relax the body and mind. Examples include yoga, meditation, tai chi, biofeedback, deep  breathing, progressive muscle relaxation, listening to music, being out in nature, journaling, and other hobbies. Again, the key is to find one or more that you enjoy and can do  regularly.  Healthy lifestyle. Eat a balanced diet, get plenty of sleep, and do not smoke. Avoid using alcohol or drugs to relax.  Strong support network. Spend time with family, friends, or other people you enjoy being around.Express your feelings and talk things over with someone you trust. Counseling or talktherapy with a mental health professional may be helpful if you are having difficulty managing stress on your own. Medicine is typically not recommended for the treatment of stress.Talk to your health care provider if you think you need medicine for symptoms of stress. HOME CARE INSTRUCTIONS  Keep all follow-up visits as directed by your health care provider.  Take all medicines as directed by your health care provider. SEEK MEDICAL CARE IF:  Your symptoms get worse or you start having new symptoms.  You feel overwhelmed by your problems and can no longer manage them on your own. SEEK IMMEDIATE MEDICAL CARE IF:  You feel like hurting yourself or someone else. Document Released: 02/17/2001 Document Revised: 01/08/2014 Document Reviewed: 04/18/2013 Exit[employer] Patient Information 2015 [employer]. This information is not intended to replace advice given to you by your health care provider. Make sure you discuss any questions you have with your health care provider.  

## 2014-06-14 NOTE — Progress Notes (Signed)
   Subjective:    Patient ID: Michelle StalkerPalmira Sellers, female    DOB: Mar 03, 1971, 43 y.o.   MRN: [id number]  Patient here today for follow up of chronic medical problems. She is doing well today without complaints   Hyperlipidemia This is a chronic problem. The current episode started more than 1 year ago. The problem is uncontrolled. Recent lipid tests were reviewed and are high. Exacerbating diseases include obesity. She has no history of diabetes, hypothyroidism or liver disease. There are no known factors aggravating her hyperlipidemia. She is currently on no antihyperlipidemic treatment. The current treatment provides no improvement of lipids. Compliance problems include adherence to diet and adherence to exercise.  Risk factors for coronary artery disease include dyslipidemia.   * patient says that her blood pressure is fluctuating- bottom number is always high and when she checks it at home it averages 140 systolic- She says that she can tell when it is elevated because her head will start hurting.   Review of Systems  Constitutional: Negative.   Respiratory: Negative.   Cardiovascular: Negative.   Genitourinary: Negative.   Neurological: Positive for headaches (occasional).  Psychiatric/Behavioral: Negative.   All other systems reviewed and are negative.      Objective:   Physical Exam  Constitutional: She is oriented to person, place, and time. She appears well-developed and well-nourished.  HENT:  Nose: Nose normal.  Mouth/Throat: Oropharynx is clear and moist.  Eyes: EOM are normal.  Neck: Trachea normal, normal range of motion and full passive range of motion without pain. Neck supple. No JVD present. Carotid bruit is not present. No thyromegaly present.  Cardiovascular: Normal rate, regular rhythm, normal heart sounds and intact distal pulses.  Exam reveals no gallop and no friction rub.   No murmur heard. Pulmonary/Chest: Effort normal and breath sounds normal.    Abdominal: Soft. Bowel sounds are normal. She exhibits no distension and no mass. There is no tenderness.  Musculoskeletal: Normal range of motion.  Lymphadenopathy:    She has no cervical adenopathy.  Neurological: She is alert and oriented to person, place, and time. She has normal reflexes.  Skin: Skin is warm and dry.  Psychiatric: She has a normal mood and affect. Her behavior is normal. Judgment and thought content normal.   BP 129/92  Pulse 71  Temp(Src) 98.4 F (36.9 C) (Oral)  Ht 5\' 4"  (1.626 m)  Wt 174 lb 9.6 oz (79.198 kg)  BMI 29.96 kg/m2  LMP 05/14/2014        Assessment & Plan:  .1. Hyperlipidemia Low fat diet  2. Essential hypertension Low Na+ diet - hydrochlorothiazide (MICROZIDE) 12.5 MG capsule; Take 1 capsule (12.5 mg total) by mouth daily.  Dispense: 30 capsule; Refill: 5  Labs pending Health maintenance reviewed Diet and exercise encouraged Continue all meds Follow up  In 6 months   Michelle Daphine DeutscherMartin, FNP

## 2014-08-06 ENCOUNTER — Encounter: Payer: Self-pay | Admitting: Adult Health

## 2014-08-06 ENCOUNTER — Ambulatory Visit (INDEPENDENT_AMBULATORY_CARE_PROVIDER_SITE_OTHER): Payer: BC Managed Care – PPO | Admitting: Adult Health

## 2014-08-06 VITALS — BP 130/90 | HR 76 | Ht 64.0 in | Wt 178.0 lb

## 2014-08-06 DIAGNOSIS — Z01419 Encounter for gynecological examination (general) (routine) without abnormal findings: Secondary | ICD-10-CM

## 2014-08-06 DIAGNOSIS — Z1212 Encounter for screening for malignant neoplasm of rectum: Secondary | ICD-10-CM

## 2014-08-06 DIAGNOSIS — N920 Excessive and frequent menstruation with regular cycle: Secondary | ICD-10-CM

## 2014-08-06 DIAGNOSIS — N951 Menopausal and female climacteric states: Secondary | ICD-10-CM

## 2014-08-06 HISTORY — DX: Menopausal and female climacteric states: N95.1

## 2014-08-06 HISTORY — DX: Excessive and frequent menstruation with regular cycle: N92.0

## 2014-08-06 LAB — HEMOCCULT GUIAC POC 1CARD (OFFICE): Fecal Occult Blood, POC: NEGATIVE

## 2014-08-06 NOTE — Progress Notes (Signed)
Patient ID: Michelle Sellers, female   DOB: Jan 11, 1971, 43 y.o.   MRN: [id number] History of Present Illness: Michelle Sellers is a 43 year old female, married in for gyn exam.She had a normal pap with negative HPV 08/02/13.she is complaining of heavier period and clots and cramps.She has some hot flashes and mood swings.Mom diagnosed with breast cancer this year.   Current Medications, Allergies, Past Medical History, Past Surgical History, Family History and Social History were reviewed in [facility] electronic medical record.     Review of Systems: Patient denies any daily headaches, blurred vision, shortness of breath, chest pain, abdominal pain, problems with bowel movements, urination, or intercourse. No joint pain, see HPI for positives.    Physical Exam:BP 130/90 mmHg  Pulse 76  Ht 5\' 4"  (1.626 m)  Wt 178 lb (80.74 kg)  BMI 30.54 kg/m2  LMP 08/01/2014 General:  Well developed, well nourished, no acute distress Skin:  Warm and dry Neck:  Midline trachea, normal thyroid Lungs; Clear to auscultation bilaterally Breast:  No dominant palpable mass, retraction, or nipple discharge Cardiovascular: Regular rate and rhythm Abdomen:  Soft, non tender, no hepatosplenomegaly Pelvic:  External genitalia is normal in appearance.  The vagina is normal in appearance.  The cervix is bulbous.  Uterus is felt to be normal size, shape, and contour.  No adnexal masses or tenderness noted. Rectal: Good sphincter tone, no polyps, or hemorrhoids felt.  Hemoccult negative. Extremities:  No swelling or varicosities noted Psych:Alert and cooperative,seems happy   Impression: Well woman exam no pap Menorrhagia Peri menopause     Plan: Return in 1 week for gyn US and see me Mammogram yearly Labs with PCP Physical in 1 year Review handout on menorrhagia and peri menopause, will discuss options after [place]

## 2014-08-06 NOTE — Patient Instructions (Signed)
Perimenopause Perimenopause is the time when your body begins to move into the menopause (no menstrual period for 12 straight months). It is a natural process. Perimenopause can begin 2-8 years before the menopause and usually lasts for 1 year after the menopause. During this time, your ovaries may or may not produce an egg. The ovaries vary in their production of estrogen and progesterone hormones each month. This can cause irregular menstrual periods, difficulty getting pregnant, vaginal bleeding between periods, and uncomfortable symptoms. CAUSES  Irregular production of the ovarian hormones, estrogen and progesterone, and not ovulating every month.  Other causes include:  Tumor of the pituitary gland in the brain.  Medical disease that affects the ovaries.  Radiation treatment.  Chemotherapy.  Unknown causes.  Heavy smoking and excessive alcohol intake can bring on perimenopause sooner. SIGNS AND SYMPTOMS   Hot flashes.  Night sweats.  Irregular menstrual periods.  Decreased sex drive.  Vaginal dryness.  Headaches.  Mood swings.  Depression.  Memory problems.  Irritability.  Tiredness.  Weight gain.  Trouble getting pregnant.  The beginning of losing bone cells (osteoporosis).  The beginning of hardening of the arteries (atherosclerosis). DIAGNOSIS  Your health care provider will make a diagnosis by analyzing your age, menstrual history, and symptoms. He or she will do a physical exam and note any changes in your body, especially your female organs. Female hormone tests may or may not be helpful depending on the amount of female hormones you produce and when you produce them. However, other hormone tests may be helpful to rule out other problems. TREATMENT  In some cases, no treatment is needed. The decision on whether treatment is necessary during the perimenopause should be made by you and your health care provider based on how the symptoms are affecting you  and your lifestyle. Various treatments are available, such as:  Treating individual symptoms with a specific medicine for that symptom.  Herbal medicines that can help specific symptoms.  Counseling.  Group therapy. HOME CARE INSTRUCTIONS   Keep track of your menstrual periods (when they occur, how heavy they are, how long between periods, and how long they last) as well as your symptoms and when they started.  Only take over-the-counter or prescription medicines as directed by your health care provider.  Sleep and rest.  Exercise.  Eat a diet that contains calcium (good for your bones) and soy (acts like the estrogen hormone).  Do not smoke.  Avoid alcoholic beverages.  Take vitamin supplements as recommended by your health care provider. Taking vitamin E may help in certain cases.  Take calcium and vitamin D supplements to help prevent bone loss.  Group therapy is sometimes helpful.  Acupuncture may help in some cases. SEEK MEDICAL CARE IF:   You have questions about any symptoms you are having.  You need a referral to a specialist (gynecologist, psychiatrist, or psychologist). SEEK IMMEDIATE MEDICAL CARE IF:   You have vaginal bleeding.  Your period lasts longer than 8 days.  Your periods are recurring sooner than 21 days.  You have bleeding after intercourse.  You have severe depression.  You have pain when you urinate.  You have severe headaches.  You have vision problems. Document Released: 10/01/2004 Document Revised: 06/14/2013 Document Reviewed: 03/23/2013 [facility] Patient Information 2015 [place], [place]. This information is not intended to replace advice given to you by your health care provider. Make sure you discuss any questions you have with your health care provider. Menorrhagia Menorrhagia is the medical  term for when your menstrual periods are heavy or last longer than usual. With menorrhagia, every period you have may cause enough blood loss  and cramping that you are unable to maintain your usual activities. CAUSES  In some cases, the cause of heavy periods is unknown, but a number of conditions may cause menorrhagia. Common causes include:  A problem with the hormone-producing thyroid gland (hypothyroid).  Noncancerous growths in the uterus (polyps or fibroids).  An imbalance of the estrogen and progesterone hormones.  One of your ovaries not releasing an egg during one or more months.  Side effects of having an intrauterine device (IUD).  Side effects of some medicines, such as anti-inflammatory medicines or blood thinners.  A bleeding disorder that stops your blood from clotting normally. SIGNS AND SYMPTOMS  During a normal period, bleeding lasts between 4 and 8 days. Signs that your periods are too heavy include:  You routinely have to change your pad or tampon every 1 or 2 hours because it is completely soaked.  You pass blood clots larger than 1 inch (2.5 cm) in size.  You have bleeding for more than 7 days.  You need to use pads and tampons at the same time because of heavy bleeding.  You need to wake up to change your pads or tampons during the night.  You have symptoms of anemia, such as tiredness, fatigue, or shortness of breath. DIAGNOSIS  Your health care provider will perform a physical exam and ask you questions about your symptoms and menstrual history. Other tests may be ordered based on what the health care provider finds during the exam. These tests can include:  Blood tests. Blood tests are used to check if you are pregnant or have hormonal changes, a bleeding or thyroid disorder, low iron levels (anemia), or other problems.  Endometrial biopsy. Your health care provider takes a sample of tissue from the inside of your uterus to be examined under a microscope.  Pelvic ultrasound. This test uses sound waves to make a picture of your uterus, ovaries, and vagina. The pictures can show if you have  fibroids or other growths.  Hysteroscopy. For this test, your health care provider will use a small telescope to look inside your uterus. Based on the results of your initial tests, your health care provider may recommend further testing. TREATMENT  Treatment may not be needed. If it is needed, your health care provider may recommend treatment with one or more medicines first. If these do not reduce bleeding enough, a surgical treatment might be an option. The best treatment for you will depend on:   Whether you need to prevent pregnancy.  Your desire to have children in the future.  The cause and severity of your bleeding.  Your opinion and personal preference.  Medicines for menorrhagia may include:  Birth control methods that use hormones. These include the pill, skin patch, vaginal ring, shots that you get every 3 months, hormonal IUD, and implant. These treatments reduce bleeding during your menstrual period.  Medicines that thicken blood and slow bleeding.  Medicines that reduce swelling, such as ibuprofen.  Medicines that contain a synthetic hormone called progestin.   Medicines that make the ovaries stop working for a short time.  You may need surgical treatment for menorrhagia if the medicines are unsuccessful. Treatment options include:  Dilation and curettage (D&C). In this procedure, your health care provider opens (dilates) your cervix and then scrapes or suctions tissue from the lining of your  uterus to reduce menstrual bleeding.  Operative hysteroscopy. This procedure uses a tiny tube with a light (hysteroscope) to view your uterine cavity and can help in the surgical removal of a polyp that may be causing heavy periods.  Endometrial ablation. Through various techniques, your health care provider permanently destroys the entire lining of your uterus (endometrium). After endometrial ablation, most women have little or no menstrual flow. Endometrial ablation reduces  your ability to become pregnant.  Endometrial resection. This surgical procedure uses an electrosurgical wire loop to remove the lining of the uterus. This procedure also reduces your ability to become pregnant.  Hysterectomy. Surgical removal of the uterus and cervix is a permanent procedure that stops menstrual periods. Pregnancy is not possible after a hysterectomy. This procedure requires anesthesia and hospitalization. HOME CARE INSTRUCTIONS   Only take over-the-counter or prescription medicines as directed by your health care provider. Take prescribed medicines exactly as directed. Do not change or switch medicines without consulting your health care provider.  Take any prescribed iron pills exactly as directed by your health care provider. Long-term heavy bleeding may result in low iron levels. Iron pills help replace the iron your body lost from heavy bleeding. Iron may cause constipation. If this becomes a problem, increase the bran, fruits, and roughage in your diet.  Do not take aspirin or medicines that contain aspirin 1 week before or during your menstrual period. Aspirin may make the bleeding worse.  If you need to change your sanitary pad or tampon more than once every 2 hours, stay in bed and rest as much as possible until the bleeding stops.  Eat well-balanced meals. Eat foods high in iron. Examples are leafy green vegetables, meat, liver, eggs, and whole grain breads and cereals. Do not try to lose weight until the abnormal bleeding has stopped and your blood iron level is back to normal. SEEK MEDICAL CARE IF:   You soak through a pad or tampon every 1 or 2 hours, and this happens every time you have a period.  You need to use pads and tampons at the same time because you are bleeding so much.  You need to change your pad or tampon during the night.  You have a period that lasts for more than 8 days.  You pass clots bigger than 1 inch wide.  You have irregular periods  that happen more or less often than once a month.  You feel dizzy or faint.  You feel very weak or tired.  You feel short of breath or feel your heart is beating too fast when you exercise.  You have nausea and vomiting or diarrhea while you are taking your medicine.  You have any problems that may be related to the medicine you are taking. SEEK IMMEDIATE MEDICAL CARE IF:   You soak through 4 or more pads or tampons in 2 hours.  You have any bleeding while you are pregnant. MAKE SURE YOU:   Understand these instructions.  Will watch your condition.  Will get help right away if you are not doing well or get worse. Document Released: 08/24/2005 Document Revised: 08/29/2013 Document Reviewed: 02/12/2013 [facility] Patient Information 2015 [place]. This information is not intended to replace advice given to you by your health care provider. Make sure you discuss any questions you have with your health care provider. Return in week for US  Physical in  1 year Mammogram yearly

## 2014-08-14 ENCOUNTER — Ambulatory Visit (INDEPENDENT_AMBULATORY_CARE_PROVIDER_SITE_OTHER): Payer: BC Managed Care – PPO | Admitting: Adult Health

## 2014-08-14 ENCOUNTER — Encounter: Payer: Self-pay | Admitting: Adult Health

## 2014-08-14 ENCOUNTER — Ambulatory Visit (INDEPENDENT_AMBULATORY_CARE_PROVIDER_SITE_OTHER): Payer: BC Managed Care – PPO

## 2014-08-14 VITALS — BP 120/88 | Ht 64.0 in | Wt 181.0 lb

## 2014-08-14 DIAGNOSIS — N920 Excessive and frequent menstruation with regular cycle: Secondary | ICD-10-CM

## 2014-08-14 DIAGNOSIS — N951 Menopausal and female climacteric states: Secondary | ICD-10-CM

## 2014-08-14 NOTE — Progress Notes (Signed)
Subjective:     Patient ID: Michelle Sellers, female   DOB: 31-May-1971, 43 y.o.   MRN: [id number]  HPI Michelle Sellers is a 43 year old female in for US for menorrhagia.  Review of Systems See HPI Reviewed past medical,surgical, social and family history. Reviewed medications and allergies.     Objective:   Physical Exam BP 120/88 mmHg  Ht 5\' 4"  (1.626 m)  Wt 181 lb (82.101 kg)  BMI 31.05 kg/m2  LMP 11/25/2015reviewed US with pt.   Uterus 9.6 x 6.7 x 5.3 cm, anteverted   Endometrium 8.3 mm, symmetrical,   Right ovary 3.0 x 1.9 x 1.7 cm, appears WNL with Follicle noted  Left ovary 2.6 x 1.9 x 1.7 cm,   No free fluid noted within the pelvis  Technician Comments:  Anteverted uterus, Endom=8.663mm symmetrical, bilateral adnexa/ovaries appear WNL, no free fluid or adnexal masses noted within the pelvis Discussed OCs, IUD and ablation to control periods and she will review handouts on IUD and ablation and let me know, she does not want the pill.  Assessment:     Menorrhagia Peri menopause    Plan:     Review handouts on IUD and ablation and let me know her choice, she also knows we can follow for now.

## 2014-08-14 NOTE — Patient Instructions (Signed)
Levonorgestrel intrauterine device (IUD) What is this medicine? LEVONORGESTREL IUD (LEE voe nor jes trel) is a contraceptive (birth control) device. The device is placed inside the uterus by a healthcare professional. It is used to prevent pregnancy and can also be used to treat heavy bleeding that occurs during your period. Depending on the device, it can be used for 3 to 5 years. This medicine may be used for other purposes; ask your health care provider or pharmacist if you have questions. COMMON BRAND NAME(S): LILETTA, Mirena, Skyla What should I tell my health care provider before I take this medicine? They need to know if you have any of these conditions: -abnormal Pap smear -cancer of the breast, uterus, or cervix -diabetes -endometritis -genital or pelvic infection now or in the past -have more than one sexual partner or your partner has more than one partner -heart disease -history of an ectopic or tubal pregnancy -immune system problems -IUD in place -liver disease or tumor -problems with blood clots or take blood-thinners -use intravenous drugs -uterus of unusual shape -vaginal bleeding that has not been explained -an unusual or allergic reaction to levonorgestrel, other hormones, silicone, or polyethylene, medicines, foods, dyes, or preservatives -pregnant or trying to get pregnant -breast-feeding How should I use this medicine? This device is placed inside the uterus by a health care professional. Talk to your pediatrician regarding the use of this medicine in children. Special care may be needed. Overdosage: If you think you have taken too much of this medicine contact a poison control center or emergency room at once. NOTE: This medicine is only for you. Do not share this medicine with others. What if I miss a dose? This does not apply. What may interact with this medicine? Do not take this medicine with any of the following  medications: -amprenavir -bosentan -fosamprenavir This medicine may also interact with the following medications: -aprepitant -barbiturate medicines for inducing sleep or treating seizures -bexarotene -griseofulvin -medicines to treat seizures like carbamazepine, ethotoin, felbamate, oxcarbazepine, phenytoin, topiramate -modafinil -pioglitazone -rifabutin -rifampin -rifapentine -some medicines to treat HIV infection like atazanavir, indinavir, lopinavir, nelfinavir, tipranavir, ritonavir -St. John's wort -warfarin This list may not describe all possible interactions. Give your health care provider a list of all the medicines, herbs, non-prescription drugs, or dietary supplements you use. Also tell them if you smoke, drink alcohol, or use illegal drugs. Some items may interact with your medicine. What should I watch for while using this medicine? Visit your doctor or health care professional for regular check ups. See your doctor if you or your partner has sexual contact with others, becomes HIV positive, or gets a sexual transmitted disease. This product does not protect you against HIV infection (AIDS) or other sexually transmitted diseases. You can check the placement of the IUD yourself by reaching up to the top of your vagina with clean fingers to feel the threads. Do not pull on the threads. It is a good habit to check placement after each menstrual period. Call your doctor right away if you feel more of the IUD than just the threads or if you cannot feel the threads at all. The IUD may come out by itself. You may become pregnant if the device comes out. If you notice that the IUD has come out use a backup birth control method like condoms and call your health care provider. Using tampons will not change the position of the IUD and are okay to use during your period. What side effects may   I notice from receiving this medicine? Side effects that you should report to your doctor or  health care professional as soon as possible: -allergic reactions like skin rash, itching or hives, swelling of the face, lips, or tongue -fever, flu-like symptoms -genital sores -high blood pressure -no menstrual period for 6 weeks during use -pain, swelling, warmth in the leg -pelvic pain or tenderness -severe or sudden headache -signs of pregnancy -stomach cramping -sudden shortness of breath -trouble with balance, talking, or walking -unusual vaginal bleeding, discharge -yellowing of the eyes or skin Side effects that usually do not require medical attention (report to your doctor or health care professional if they continue or are bothersome): -acne -breast pain -change in sex drive or performance -changes in weight -cramping, dizziness, or faintness while the device is being inserted -headache -irregular menstrual bleeding within first 3 to 6 months of use -nausea This list may not describe all possible side effects. Call your doctor for medical advice about side effects. You may report side effects to FDA at 1-800-FDA-1088. Where should I keep my medicine? This does not apply. NOTE: This sheet is a summary. It may not cover all possible information. If you have questions about this medicine, talk to your doctor, pharmacist, or health care provider.  2015, [employer]. (2011-09-24 13:54:04) Review handouts on ablation and IUD and let me know

## 2014-11-15 ENCOUNTER — Ambulatory Visit: Payer: Self-pay | Admitting: Family

## 2014-11-17 ENCOUNTER — Ambulatory Visit (INDEPENDENT_AMBULATORY_CARE_PROVIDER_SITE_OTHER): Payer: BLUE CROSS/BLUE SHIELD | Admitting: Nurse Practitioner

## 2014-11-17 VITALS — BP 117/84 | HR 70 | Temp 98.1°F | Ht 64.0 in | Wt 176.0 lb

## 2014-11-17 DIAGNOSIS — J0101 Acute recurrent maxillary sinusitis: Secondary | ICD-10-CM | POA: Diagnosis not present

## 2014-11-17 MED ORDER — AZITHROMYCIN 250 MG PO TABS
ORAL_TABLET | ORAL | Status: DC
Start: 2014-11-17 — End: 2014-11-23

## 2014-11-17 NOTE — Patient Instructions (Signed)

## 2014-11-17 NOTE — Progress Notes (Signed)
  Subjective:     Michelle Sellers is a 44 y.o. female who presents for evaluation of sinus pain. Symptoms include: congestion, cough, facial pain, headaches, nasal congestion and sinus pressure. Onset of symptoms was 4 days ago. Symptoms have been gradually worsening since that time. Past history is significant for no history of pneumonia or bronchitis. Patient is a non-smoker.  The following portions of the patient's history were reviewed and updated as appropriate: allergies, current medications, past family history, past medical history, past social history, past surgical history and problem list.  Review of Systems Pertinent items are noted in HPI.   Objective:    BP 117/84 mmHg  Pulse 70  Temp(Src) 98.1 F (36.7 C) (Oral)  Ht 5\' 4"  (1.626 m)  Wt 176 lb (79.833 kg)  BMI 30.20 kg/m2  LMP 11/13/2014 General appearance: alert and cooperative Eyes: conjunctivae/corneas clear. PERRL, EOM's intact. Fundi benign. Ears: normal TM's and external ear canals both ears Nose: Nares normal. Septum midline. Mucosa normal. No drainage or sinus tenderness., green discharge, moderate congestion, sinus tenderness bilateral Throat: lips, mucosa, and tongue normal; teeth and gums normal Neck: no adenopathy, no carotid bruit, no JVD, supple, symmetrical, trachea midline and thyroid not enlarged, symmetric, no tenderness/mass/nodules Lungs: clear to auscultation bilaterally Heart: regular rate and rhythm, S1, S2 normal, no murmur, click, rub or gallop    Assessment:    Acute bacterial sinusitis.    Plan:  1. Take meds as prescribed 2. Use a cool mist humidifier especially during the winter months and when heat has been humid. 3. Use saline nose sprays frequently 4. Saline irrigations of the nose can be very helpful if done frequently.  * 4X daily for 1 week*  * Use of a nettie pot can be helpful with this. Follow directions with this* 5. Drink plenty of fluids 6. Keep thermostat turn down  low 7.For any cough or congestion  Use plain Mucinex- regular strength or max strength is fine   * Children- consult with Pharmacist for dosing 8. For fever or aces or pains- take tylenol or ibuprofen appropriate for age and weight.  * for fevers greater than 101 orally you may alternate ibuprofen and tylenol every  3 hours.   Meds ordered this encounter  Medications  . azithromycin (ZITHROMAX) 250 MG tablet    Sig: Two tablets day one, then one tablet daily next 4 days.    Dispense:  6 tablet    Refill:  0    Order Specific Question:  Supervising Provider    Answer:  Ernestina PennaMOORE, DONALD W [[id number]]    Michelle Daphine DeutscherMartin, FNP

## 2014-11-23 ENCOUNTER — Ambulatory Visit (INDEPENDENT_AMBULATORY_CARE_PROVIDER_SITE_OTHER): Payer: BLUE CROSS/BLUE SHIELD | Admitting: Family Medicine

## 2014-11-23 ENCOUNTER — Telehealth: Payer: Self-pay | Admitting: Nurse Practitioner

## 2014-11-23 VITALS — BP 130/84 | HR 65 | Temp 98.4°F | Ht 64.0 in | Wt 179.1 lb

## 2014-11-23 DIAGNOSIS — J0101 Acute recurrent maxillary sinusitis: Secondary | ICD-10-CM

## 2014-11-23 MED ORDER — BETAMETHASONE SOD PHOS & ACET 6 (3-3) MG/ML IJ SUSP
6.0000 mg | Freq: Once | INTRAMUSCULAR | Status: AC
  Administered 2014-11-23: 6 mg via INTRAMUSCULAR

## 2014-11-23 MED ORDER — MOXIFLOXACIN HCL 400 MG PO TABS: 400.0000 mg | ORAL_TABLET | Freq: Every day | ORAL | Status: DC

## 2014-11-23 MED ORDER — LEVOCETIRIZINE DIHYDROCHLORIDE 5 MG PO TABS: 5.0000 mg | ORAL_TABLET | Freq: Every evening | ORAL | Status: DC

## 2014-11-23 MED ORDER — BUTALBITAL-APAP-CAFFEINE 50-325-40 MG PO TABS: 1.0000 | ORAL_TABLET | ORAL | Status: DC | PRN

## 2014-11-23 NOTE — Telephone Encounter (Signed)
Pt is not taking Mucinex or decongestion, just doing nasal spray, allergy medicine. Finished Zpack but her BP is staying up today at 149/101. Last night was 137/98 & keeping headache in back of head, but other sinus type of pressure is gone. What else can she do, or ntbs again?

## 2014-11-23 NOTE — Telephone Encounter (Signed)
Patient came in to be evaluated by Dr Darlyn ReadStacks

## 2014-11-23 NOTE — Progress Notes (Signed)
Subjective:  Patient ID: Michelle Sellers, female    DOB: 1970-11-26  Age: 44 y.o. MRN: [id number]  CC: Headache   HPI Michelle Sellers presents for 8/10 HA pain around eyes and occipital onset 2 days ago. Symptoms include congestion, facial pain, nasal congestion, no  fever, non productive cough, post nasal drip and sinus pressure with no fever, chills, night sweats or weight loss. Onset of symptoms was a few days ago, gradually worsening since that time. Pt.is drinking moderate amounts of fluids.   History Michelle Sellers has a past medical history of Allergy; Menorrhagia with regular cycle (08/06/2014); and Peri-menopause (08/06/2014).   She has no past surgical history on file.   Her family history includes Cancer in her mother; Heart disease in her father; Hyperlipidemia in her father, maternal grandfather, maternal grandmother, mother, paternal grandfather, and paternal grandmother; Hypertension in her father and mother.She reports that she has never smoked. She has never used smokeless tobacco. She reports that she drinks about 0.6 oz of alcohol per week. She reports that she does not use illicit drugs.  Current Outpatient Prescriptions on File Prior to Visit  Medication Sig Dispense Refill  . cholecalciferol (VITAMIN D) 1000 UNITS tablet Take 1,000 Units by mouth daily.    . fexofenadine (ALLEGRA) 180 MG tablet Take 180 mg by mouth daily.    . Magnesium 250 MG TABS Take by mouth daily.    . Omega-3 Fatty Acids (FISH OIL) 1000 MG CPDR Take by mouth daily.     No current facility-administered medications on file prior to visit.    ROS Review of Systems  Constitutional: Negative for fever, chills, activity change and appetite change.  HENT: Positive for congestion, postnasal drip, rhinorrhea and sinus pressure. Negative for ear discharge, ear pain, hearing loss, nosebleeds, sneezing and trouble swallowing.   Respiratory: Negative for chest tightness and shortness of breath.     Cardiovascular: Negative for chest pain and palpitations.  Skin: Negative for rash.    Objective:  BP 130/84 mmHg  Pulse 65  Temp(Src) 98.4 F (36.9 C) (Oral)  Ht  (1.626 m)  Wt 179 lb 2 oz (81.251 kg)  BMI 30.73 kg/m2  LMP 11/13/2014  BP Readings from Last 3 Encounters:  11/23/14 130/84  11/17/14 117/84  08/14/14 120/88    Wt Readings from Last 3 Encounters:  11/23/14 179 lb 2 oz (81.251 kg)  11/17/14 176 lb (79.833 kg)  08/14/14 181 lb (82.101 kg)     Physical Exam  Constitutional: She appears well-developed and well-nourished.  HENT:  Head: Normocephalic and atraumatic.  Right Ear: Tympanic membrane and external ear normal. No decreased hearing is noted.  Left Ear: Tympanic membrane and external ear normal. No decreased hearing is noted.  Nose: Mucosal edema present. Right sinus exhibits no frontal sinus tenderness. Left sinus exhibits no frontal sinus tenderness.  Mouth/Throat: No oropharyngeal exudate or posterior oropharyngeal erythema.  Neck: No Brudzinski's sign noted.  Pulmonary/Chest: Breath sounds normal. No respiratory distress.  Lymphadenopathy:       Head (right side): No preauricular adenopathy present.       Head (left side): No preauricular adenopathy present.       Right cervical: No superficial cervical adenopathy present.      Left cervical: No superficial cervical adenopathy present.    Lab Results  Component Value Date   HGBA1C 4.7 06/08/2013    Lab Results  Component Value Date   WBC 9.4 06/06/2014   HGB 13.0 06/06/2014   HCT  38.4 06/06/2014   GLUCOSE 85 06/06/2014   CHOL 168 06/06/2014   TRIG 134 06/06/2014   HDL 36* 06/06/2014   LDLCALC 105* 06/06/2014   ALT 6 06/06/2014   AST 8 06/06/2014   NA 141 06/06/2014   K 4.5 06/06/2014   CL 103 06/06/2014   CREATININE 0.85 06/06/2014   BUN 20 06/06/2014   CO2 24 06/06/2014   TSH 1.070 06/08/2013   HGBA1C 4.7 06/08/2013    No results found.  Assessment & Plan:   Michelle Sellers  was seen today for headache.  Diagnoses and all orders for this visit:  Acute recurrent maxillary sinusitis Orders: -     betamethasone acetate-betamethasone sodium phosphate (CELESTONE) injection 6 mg; Inject 1 mL (6 mg total) into the muscle once.  Other orders -     levocetirizine (XYZAL) 5 MG tablet; Take 1 tablet (5 mg total) by mouth every evening. -     moxifloxacin (AVELOX) 400 MG tablet; Take 1 tablet (400 mg total) by mouth daily. -     butalbital-acetaminophen-caffeine (FIORICET) 50-325-40 MG per tablet; Take 1 tablet by mouth every 4 (four) hours as needed for headache.  I have discontinued Michelle Sellers azithromycin. I am also having her start on levocetirizine, moxifloxacin, and butalbital-acetaminophen-caffeine. Additionally, I am having her maintain her fexofenadine, Fish Oil, Magnesium, cholecalciferol, and hydrochlorothiazide. We administered betamethasone acetate-betamethasone sodium phosphate.  Meds ordered this encounter  Medications  . hydrochlorothiazide (MICROZIDE) 12.5 MG capsule    Sig: Take 12.5 mg by mouth daily.  Marland Kitchen. levocetirizine (XYZAL) 5 MG tablet    Sig: Take 1 tablet (5 mg total) by mouth every evening.    Dispense:  30 tablet    Refill:  11  . moxifloxacin (AVELOX) 400 MG tablet    Sig: Take 1 tablet (400 mg total) by mouth daily.    Dispense:  10 tablet    Refill:  0  . butalbital-acetaminophen-caffeine (FIORICET) 50-325-40 MG per tablet    Sig: Take 1 tablet by mouth every 4 (four) hours as needed for headache.    Dispense:  30 tablet    Refill:  2  . betamethasone acetate-betamethasone sodium phosphate (CELESTONE) injection 6 mg    Sig:      Follow-up: No Follow-up on file.  Mechele ClaudeWarren Indigo Barbian, M.D.

## 2014-11-25 ENCOUNTER — Encounter: Payer: Self-pay | Admitting: Family Medicine

## 2015-03-19 ENCOUNTER — Ambulatory Visit (INDEPENDENT_AMBULATORY_CARE_PROVIDER_SITE_OTHER): Payer: BLUE CROSS/BLUE SHIELD | Admitting: Nurse Practitioner

## 2015-03-19 ENCOUNTER — Encounter: Payer: Self-pay | Admitting: Nurse Practitioner

## 2015-03-19 VITALS — BP 125/90 | HR 63 | Temp 98.0°F | Ht 64.0 in | Wt 173.0 lb

## 2015-03-19 DIAGNOSIS — I10 Essential (primary) hypertension: Secondary | ICD-10-CM | POA: Diagnosis not present

## 2015-03-19 DIAGNOSIS — J302 Other seasonal allergic rhinitis: Secondary | ICD-10-CM

## 2015-03-19 DIAGNOSIS — E785 Hyperlipidemia, unspecified: Secondary | ICD-10-CM

## 2015-03-19 MED ORDER — FLUTICASONE PROPIONATE 50 MCG/ACT NA SUSP: 2.0000 | Freq: Every day | NASAL | Status: DC

## 2015-03-19 MED ORDER — BETAMETHASONE SOD PHOS & ACET 6 (3-3) MG/ML IJ SUSP
6.0000 mg | Freq: Once | INTRAMUSCULAR | Status: AC
  Administered 2015-03-19: 6 mg via INTRAMUSCULAR

## 2015-03-19 NOTE — Progress Notes (Signed)
   Subjective:    Patient ID: Michelle Sellers, female    DOB: 12/30/1970, 44 y.o.   MRN: [id number]  Patient here today for follow up of chronic medical problems. She is doing well today. SHe c/o sinus congestion and PND- takes flonase and allegra some better but will not go away.  Hyperlipidemia This is a chronic problem. The current episode started more than 1 year ago. Recent lipid tests were reviewed and are variable. She has no history of diabetes, hypothyroidism or obesity. Current antihyperlipidemic treatment includes diet change. The current treatment provides moderate improvement of lipids. There are no compliance problems.  Risk factors for coronary artery disease include dyslipidemia and hypertension.  Hypertension This is a chronic problem. The current episode started more than 1 year ago. The problem is controlled. Risk factors for coronary artery disease include dyslipidemia, post-menopausal state and sedentary lifestyle. Past treatments include nothing. The current treatment provides moderate improvement. There are no compliance problems.  There is no history of CAD/MI or CVA.     Review of Systems  Constitutional: Negative.   HENT: Positive for postnasal drip and rhinorrhea. Negative for sinus pressure.   Respiratory: Negative.  Negative for cough.   Cardiovascular: Negative.   Genitourinary: Negative.   Psychiatric/Behavioral: Negative.   All other systems reviewed and are negative.      Objective:   Physical Exam  Constitutional: She is oriented to person, place, and time. She appears well-developed and well-nourished.  HENT:  Nose: Nose normal.  Mouth/Throat: Oropharynx is clear and moist.  Eyes: EOM are normal.  Neck: Trachea normal, normal range of motion and full passive range of motion without pain. Neck supple. No JVD present. Carotid bruit is not present. No thyromegaly present.  Cardiovascular: Normal rate, regular rhythm, normal heart sounds and intact  distal pulses.  Exam reveals no gallop and no friction rub.   No murmur heard. Pulmonary/Chest: Effort normal and breath sounds normal.  Abdominal: Soft. Bowel sounds are normal. She exhibits no distension and no mass. There is no tenderness.  Musculoskeletal: Normal range of motion.  Lymphadenopathy:    She has no cervical adenopathy.  Neurological: She is alert and oriented to person, place, and time. She has normal reflexes.  Skin: Skin is warm and dry.  Psychiatric: She has a normal mood and affect. Her behavior is normal. Judgment and thought content normal.   BP 125/90 mmHg  Pulse 63  Temp(Src) 98 F (36.7 C) (Oral)  Ht 5' 4" (1.626 m)  Wt 173 lb (78.472 kg)  BMI 29.68 kg/m2        Assessment & Plan:  1. Essential hypertension Do not add salt o diet - CMP14+EGFR  2. Hyperlipidemia Low fat diet - Lipid panel  3. Other seasonal allergic rhinitis - fluticasone (FLONASE) 50 MCG/ACT nasal spray; Place 2 sprays into both nostrils daily.  Dispense: 16 g; Refill: 6 - betamethasone acetate-betamethasone sodium phosphate (CELESTONE) injection 6 mg; Inject 1 mL (6 mg total) into the muscle once.    Labs pending Health maintenance reviewed Diet and exercise encouraged Continue all meds Follow up  In 3 months   Mary-Margaret Martin, FNP    

## 2015-03-19 NOTE — Patient Instructions (Signed)

## 2015-03-19 NOTE — Addendum Note (Signed)
Addended by: Bennie PieriniMARTIN, MARY-MARGARET on: 03/19/2015 11:05 AM   Modules accepted: Level of Service

## 2015-03-20 ENCOUNTER — Telehealth: Payer: Self-pay | Admitting: *Deleted

## 2015-03-20 LAB — CMP14+EGFR
A/G RATIO: 2 (ref 1.1–2.5)
ALK PHOS: 53 IU/L (ref 39–117)
ALT: 6 IU/L (ref 0–32)
AST: 11 IU/L (ref 0–40)
Albumin: 4.3 g/dL (ref 3.5–5.5)
BILIRUBIN TOTAL: 0.8 mg/dL (ref 0.0–1.2)
BUN/Creatinine Ratio: 14 (ref 9–23)
BUN: 11 mg/dL (ref 6–24)
CO2: 21 mmol/L (ref 18–29)
Calcium: 9.2 mg/dL (ref 8.7–10.2)
Chloride: 105 mmol/L (ref 97–108)
Creatinine, Ser: 0.8 mg/dL (ref 0.57–1.00)
GFR calc non Af Amer: 89 mL/min/{1.73_m2} (ref >59)
GFR, EST AFRICAN AMERICAN: 103 mL/min/{1.73_m2} (ref >59)
Globulin, Total: 2.1 g/dL (ref 1.5–4.5)
Glucose: 86 mg/dL (ref 65–99)
POTASSIUM: 4.1 mmol/L (ref 3.5–5.2)
Sodium: 139 mmol/L (ref 134–144)
Total Protein: 6.4 g/dL (ref 6.0–8.5)

## 2015-03-20 LAB — LIPID PANEL
CHOL/HDL RATIO: 4.7 ratio — AB (ref 0.0–4.4)
Cholesterol, Total: 166 mg/dL (ref 100–199)
HDL: 35 mg/dL — AB (ref >39)
LDL Calculated: 99 mg/dL (ref 0–99)
TRIGLYCERIDES: 161 mg/dL — AB (ref 0–149)
VLDL Cholesterol Cal: 32 mg/dL (ref 5–40)

## 2015-03-20 NOTE — Telephone Encounter (Signed)
[email or web address]

## 2015-03-20 NOTE — Telephone Encounter (Signed)
-----   [facility]-Margaret Martin, FNP sent at 03/20/2015 12:54 PM EDT ----- Kidney and liver function stable Trig are slightly elevated but otherwise look good Continue current meds- low fat diet and exercise and recheck in 3 months

## 2015-03-21 NOTE — Telephone Encounter (Signed)
Aware of lab results  

## 2015-07-31 ENCOUNTER — Ambulatory Visit: Payer: BLUE CROSS/BLUE SHIELD | Admitting: Family Medicine

## 2015-07-31 ENCOUNTER — Ambulatory Visit (INDEPENDENT_AMBULATORY_CARE_PROVIDER_SITE_OTHER): Payer: BLUE CROSS/BLUE SHIELD | Admitting: Pediatrics

## 2015-07-31 ENCOUNTER — Other Ambulatory Visit: Payer: BLUE CROSS/BLUE SHIELD

## 2015-07-31 ENCOUNTER — Encounter: Payer: Self-pay | Admitting: Pediatrics

## 2015-07-31 VITALS — BP 132/95 | HR 76 | Temp 98.3°F | Ht 64.0 in | Wt 176.0 lb

## 2015-07-31 DIAGNOSIS — I10 Essential (primary) hypertension: Secondary | ICD-10-CM

## 2015-07-31 DIAGNOSIS — E785 Hyperlipidemia, unspecified: Secondary | ICD-10-CM

## 2015-07-31 DIAGNOSIS — J018 Other acute sinusitis: Secondary | ICD-10-CM | POA: Diagnosis not present

## 2015-07-31 MED ORDER — AZITHROMYCIN 250 MG PO TABS: ORAL_TABLET | ORAL | Status: DC

## 2015-07-31 NOTE — Progress Notes (Signed)
    Subjective:    Patient ID: Michelle Sellers, female    DOB: June 09, 1971, 44 y.o.   MRN: [id number]  CC: URI sx  HPI: Michelle Sellers is a 44 y.o. female presenting on 07/31/2015 for Chest congestion; Shortness of Breath; and Cough  Mucinex for past 3 days Symptoms started 7 days ago, started getting worse today Feels worse at night Coughing not keeping her up at night Lots of congestion No fevers No sore throat now   Relevant past medical, surgical, family and social history reviewed and updated as indicated. Interim medical history since our last visit reviewed. Allergies and medications reviewed and updated.   ROS: Per HPI unless specifically indicated above  Past Medical History Patient Active Problem List   Diagnosis Date Noted  . Menorrhagia with regular cycle 08/06/2014  . Peri-menopause 08/06/2014  . Essential hypertension 06/14/2014  . Hyperlipidemia 08/02/2013  . Allergic rhinitis 12/03/2012    Current Outpatient Prescriptions  Medication Sig Dispense Refill  . cholecalciferol (VITAMIN D) 1000 UNITS tablet Take 1,000 Units by mouth daily.    . fexofenadine (ALLEGRA) 180 MG tablet Take 180 mg by mouth daily.    . fluticasone (FLONASE) 50 MCG/ACT nasal spray Place 2 sprays into both nostrils daily. 16 g 6  . Omega-3 Fatty Acids (FISH OIL) 1000 MG CPDR Take by mouth daily.    Marland Kitchen. azithromycin (ZITHROMAX) 250 MG tablet Take 2 the first day and then one each day after. 6 tablet 0   No current facility-administered medications for this visit.       Objective:    BP 132/95 mmHg  Pulse 76  Temp(Src) 98.3 F (36.8 C) (Oral)  Ht 5\' 4"  (1.626 m)  Wt 176 lb (79.833 kg)  BMI 30.20 kg/m2  SpO2 98%  Wt Readings from Last 3 Encounters:  07/31/15 176 lb (79.833 kg)  03/19/15 173 lb (78.472 kg)  11/23/14 179 lb 2 oz (81.251 kg)     Gen: NAD, alert, cooperative with exam, NCAT EYES: EOMI, no scleral injection or icterus ENT:  TMs dull gray b/l, OP with  mild erythema, some tenderness over frontal sinuses b/l LYMPH: no cervical LAD CV: NRRR, normal S1/S2, no murmur, distal pulses 2+ b/l Resp: CTABL, no wheezes, normal WOB Abd: +BS, soft, NTND. no guarding or organomegaly Ext: No edema, warm Neuro: Alert and oriented, strength equal b/l UE and LE, coordination grossly normal MSK: normal muscle bulk     Assessment & Plan:   Michelle Sellers was seen today for chest congestion, shortness of breath and cough, ongoing for over a week. Rec abx as below and symptomatic care.  Diagnoses and all orders for this visit:  Other acute sinusitis -     azithromycin (ZITHROMAX) 250 MG tablet; Take 2 the first day and then one each day after.   Follow up plan: Return if symptoms worsen or fail to improve.  Rex Krasarol Vincent, MD [facility] 07/31/2015, 12:26 PM

## 2015-07-31 NOTE — Patient Instructions (Signed)
 600mg  ibuprofen 2-3 times a day  Netipot twice a day

## 2015-08-01 LAB — CMP14+EGFR
ALT: 10 IU/L (ref 0–32)
AST: 11 IU/L (ref 0–40)
Albumin/Globulin Ratio: 1.9 (ref 1.1–2.5)
Albumin: 4.2 g/dL (ref 3.5–5.5)
Alkaline Phosphatase: 55 IU/L (ref 39–117)
BUN/Creatinine Ratio: 22 (ref 9–23)
BUN: 17 mg/dL (ref 6–24)
Bilirubin Total: 0.5 mg/dL (ref 0.0–1.2)
CO2: 22 mmol/L (ref 18–29)
Calcium: 9 mg/dL (ref 8.7–10.2)
Chloride: 104 mmol/L (ref 97–106)
Creatinine, Ser: 0.79 mg/dL (ref 0.57–1.00)
GFR calc Af Amer: 105 mL/min/{1.73_m2} (ref >59)
GFR calc non Af Amer: 91 mL/min/{1.73_m2} (ref >59)
Globulin, Total: 2.2 g/dL (ref 1.5–4.5)
Glucose: 95 mg/dL (ref 65–99)
Potassium: 4.4 mmol/L (ref 3.5–5.2)
SODIUM: 141 mmol/L (ref 136–144)
Total Protein: 6.4 g/dL (ref 6.0–8.5)

## 2015-08-01 LAB — LIPID PANEL
Chol/HDL Ratio: 4.4 ratio units (ref 0.0–4.4)
Cholesterol, Total: 144 mg/dL (ref 100–199)
HDL: 33 mg/dL — AB (ref >39)
LDL Calculated: 91 mg/dL (ref 0–99)
TRIGLYCERIDES: 98 mg/dL (ref 0–149)
VLDL CHOLESTEROL CAL: 20 mg/dL (ref 5–40)

## 2015-08-01 LAB — VITAMIN D 25 HYDROXY (VIT D DEFICIENCY, FRACTURES): Vit D, 25-Hydroxy: 21.8 ng/mL — ABNORMAL LOW (ref 30.0–100.0)

## 2015-08-06 ENCOUNTER — Encounter: Payer: Self-pay | Admitting: Nurse Practitioner

## 2015-08-06 ENCOUNTER — Ambulatory Visit (INDEPENDENT_AMBULATORY_CARE_PROVIDER_SITE_OTHER): Payer: BLUE CROSS/BLUE SHIELD | Admitting: Nurse Practitioner

## 2015-08-06 VITALS — BP 130/93 | HR 66 | Temp 97.4°F | Ht 64.0 in | Wt 174.0 lb

## 2015-08-06 DIAGNOSIS — I10 Essential (primary) hypertension: Secondary | ICD-10-CM

## 2015-08-06 DIAGNOSIS — E785 Hyperlipidemia, unspecified: Secondary | ICD-10-CM | POA: Diagnosis not present

## 2015-08-06 DIAGNOSIS — Z6829 Body mass index (BMI) 29.0-29.9, adult: Secondary | ICD-10-CM | POA: Diagnosis not present

## 2015-08-06 DIAGNOSIS — Z6830 Body mass index (BMI) 30.0-30.9, adult: Secondary | ICD-10-CM | POA: Insufficient documentation

## 2015-08-06 DIAGNOSIS — F411 Generalized anxiety disorder: Secondary | ICD-10-CM | POA: Diagnosis not present

## 2015-08-06 MED ORDER — HYDROCHLOROTHIAZIDE 12.5 MG PO CAPS: 12.5000 mg | ORAL_CAPSULE | Freq: Every day | ORAL | Status: DC

## 2015-08-06 MED ORDER — CITALOPRAM HYDROBROMIDE 20 MG PO TABS: 20.0000 mg | ORAL_TABLET | Freq: Every day | ORAL | Status: DC

## 2015-08-06 NOTE — Addendum Note (Signed)
Addended by: Bennie PieriniMARTIN, MARY-MARGARET on: 08/06/2015 03:48 PM   Modules accepted: Orders

## 2015-08-06 NOTE — Patient Instructions (Signed)
Stress and Stress Management Stress is a normal reaction to life events. It is what you feel when life demands more than you are used to or more than you can handle. Some stress can be useful. For example, the stress reaction can help you catch the last bus of the day, study for a test, or meet a deadline at work. But stress that occurs too often or for too long can cause problems. It can affect your emotional health and interfere with relationships and normal daily activities. Too much stress can weaken your immune system and increase your risk for physical illness. If you already have a medical problem, stress can make it worse. CAUSES  All sorts of life events may cause stress. An event that causes stress for one person may not be stressful for another person. Major life events commonly cause stress. These may be positive or negative. Examples include losing your job, moving into a new home, getting married, having a baby, or losing a loved one. Less obvious life events may also cause stress, especially if they occur day after day or in combination. Examples include working long hours, driving in traffic, caring for children, being in debt, or being in a difficult relationship. SIGNS AND SYMPTOMS Stress may cause emotional symptoms including, the following:  Anxiety. This is feeling worried, afraid, on edge, overwhelmed, or out of control.  Anger. This is feeling irritated or impatient.  Depression. This is feeling sad, down, helpless, or guilty.  Difficulty focusing, remembering, or making decisions. Stress may cause physical symptoms, including the following:   Aches and pains. These may affect your head, neck, back, stomach, or other areas of your body.  Tight muscles or clenched jaw.  Low energy or trouble sleeping. Stress may cause unhealthy behaviors, including the following:   Eating to feel better (overeating) or skipping meals.  Sleeping too little, too much, or both.  Working  too much or putting off tasks (procrastination).  Smoking, drinking alcohol, or using drugs to feel better. DIAGNOSIS  Stress is diagnosed through an assessment by your health care provider. Your health care provider will ask questions about your symptoms and any stressful life events.Your health care provider will also ask about your medical history and may order blood tests or other tests. Certain medical conditions and medicine can cause physical symptoms similar to stress. Mental illness can cause emotional symptoms and unhealthy behaviors similar to stress. Your health care provider may refer you to a mental health professional for further evaluation.  TREATMENT  Stress management is the recommended treatment for stress.The goals of stress management are reducing stressful life events and coping with stress in healthy ways.  Techniques for reducing stressful life events include the following:  Stress identification. Self-monitor for stress and identify what causes stress for you. These skills may help you to avoid some stressful events.  Time management. Set your priorities, keep a calendar of events, and learn to say "no." These tools can help you avoid making too many commitments. Techniques for coping with stress include the following:  Rethinking the problem. Try to think realistically about stressful events rather than ignoring them or overreacting. Try to find the positives in a stressful situation rather than focusing on the negatives.  Exercise. Physical exercise can release both physical and emotional tension. The key is to find a form of exercise you enjoy and do it regularly.  Relaxation techniques. These relax the body and mind. Examples include yoga, meditation, tai chi, biofeedback, deep  breathing, progressive muscle relaxation, listening to music, being out in nature, journaling, and other hobbies. Again, the key is to find one or more that you enjoy and can do  regularly.  Healthy lifestyle. Eat a balanced diet, get plenty of sleep, and do not smoke. Avoid using alcohol or drugs to relax.  Strong support network. Spend time with family, friends, or other people you enjoy being around.Express your feelings and talk things over with someone you trust. Counseling or talktherapy with a mental health professional may be helpful if you are having difficulty managing stress on your own. Medicine is typically not recommended for the treatment of stress.Talk to your health care provider if you think you need medicine for symptoms of stress. HOME CARE INSTRUCTIONS  Keep all follow-up visits as directed by your health care provider.  Take all medicines as directed by your health care provider. SEEK MEDICAL CARE IF:  Your symptoms get worse or you start having new symptoms.  You feel overwhelmed by your problems and can no longer manage them on your own. SEEK IMMEDIATE MEDICAL CARE IF:  You feel like hurting yourself or someone else.   This information is not intended to replace advice given to you by your health care provider. Make sure you discuss any questions you have with your health care provider.   Document Released: 02/17/2001 Document Revised: 09/14/2014 Document Reviewed: 04/18/2013 Elsevier Interactive Patient Education 2016 [employer]  

## 2015-08-06 NOTE — Progress Notes (Signed)
   Subjective:    Patient ID: Marda StalkerPalmira Dahle, female    DOB: [birth year], 44 y.o.   MRN: [id number]  Patient here today for follow up of chronic medical problems.   Hyperlipidemia This is a chronic problem. The current episode started more than 1 year ago. Recent lipid tests were reviewed and are variable. She has no history of diabetes, hypothyroidism or obesity. Current antihyperlipidemic treatment includes diet change. The current treatment provides moderate improvement of lipids. There are no compliance problems.  Risk factors for coronary artery disease include dyslipidemia and hypertension.  Hypertension This is a chronic problem. The current episode started more than 1 year ago. The problem is controlled. Risk factors for coronary artery disease include dyslipidemia, post-menopausal state and sedentary lifestyle. Past treatments include nothing. The current treatment provides moderate improvement. There are no compliance problems.  There is no history of CAD/MI or CVA.  GAd Stays anxious all the time- constantly on edge and worrying. Thinks it is time to start something. Review of Systems  Constitutional: Negative.   HENT: Positive for postnasal drip and rhinorrhea. Negative for sinus pressure.   Respiratory: Negative.  Negative for cough.   Cardiovascular: Negative.   Genitourinary: Negative.   Psychiatric/Behavioral: Negative.   All other systems reviewed and are negative.      Objective:   Physical Exam  Constitutional: She is oriented to person, place, and time. She appears well-developed and well-nourished.  HENT:  Nose: Nose normal.  Mouth/Throat: Oropharynx is clear and moist.  Eyes: EOM are normal.  Neck: Trachea normal, normal range of motion and full passive range of motion without pain. Neck supple. No JVD present. Carotid bruit is not present. No thyromegaly present.  Cardiovascular: Normal rate, regular rhythm, normal heart sounds and intact distal pulses.  Exam  reveals no gallop and no friction rub.   No murmur heard. Pulmonary/Chest: Effort normal and breath sounds normal.  Abdominal: Soft. Bowel sounds are normal. She exhibits no distension and no mass. There is no tenderness.  Musculoskeletal: Normal range of motion.  Lymphadenopathy:    She has no cervical adenopathy.  Neurological: She is alert and oriented to person, place, and time. She has normal reflexes.  Skin: Skin is warm and dry.  Psychiatric: She has a normal mood and affect. Her behavior is normal. Judgment and thought content normal.   BP 130/93 mmHg  Pulse 66  Temp(Src) 97.4 F (36.3 C) (Oral)  Ht 5\' 4"  (1.626 m)  Wt 174 lb (78.926 kg)  BMI 29.85 kg/m2        Assessment & Plan:  1. Essential hypertension Do not add salt to diet Add hctz to lower diastolic pressure - hydrochlorothiazide (MICROZIDE) 12.5 MG capsule; Take 1 capsule (12.5 mg total) by mouth daily.  Dispense: 30 capsule; Refill: 6  2. Hyperlipidemia Low fat diet  3. BMI 29.0-29.9,adult Discussed diet and exercise for person with BMI >25 Will recheck weight in 3-6 months  4. GAD (generalized anxiety disorder) Stress management - citalopram (CELEXA) 20 MG tablet; Take 1 tablet (20 mg total) by mouth daily.  Dispense: 30 tablet; Refill: 5    Labs reviewed with patient Health maintenance reviewed Diet and exercise encouraged Continue all meds Follow up  In 3 months  Mary-Margaret Daphine DeutscherMartin, FNP

## 2015-09-25 ENCOUNTER — Encounter: Payer: Self-pay | Admitting: Family Medicine

## 2015-09-25 ENCOUNTER — Ambulatory Visit (INDEPENDENT_AMBULATORY_CARE_PROVIDER_SITE_OTHER): Payer: BLUE CROSS/BLUE SHIELD | Admitting: Family Medicine

## 2015-09-25 VITALS — BP 127/79 | HR 82 | Temp 97.8°F | Ht 64.0 in | Wt 171.8 lb

## 2015-09-25 DIAGNOSIS — J019 Acute sinusitis, unspecified: Secondary | ICD-10-CM | POA: Diagnosis not present

## 2015-09-25 DIAGNOSIS — H8301 Labyrinthitis, right ear: Secondary | ICD-10-CM

## 2015-09-25 MED ORDER — PREDNISONE 20 MG PO TABS: ORAL_TABLET | ORAL | Status: DC

## 2015-09-25 NOTE — Progress Notes (Signed)
BP 127/79 mmHg  Pulse 82  Temp(Src) 97.8 F (36.6 C) (Oral)  Ht  (1.626 m)  Wt 171 lb 12.8 oz (77.928 kg)  BMI 29.47 kg/m2  LMP 09/25/2015   Subjective:    Patient ID: [name], female    DOB: May 19, 1971, 45 y.o.   MRN: [id number]  HPI: Ronnisha Felber is a 45 y.o. female presenting on 09/25/2015 for Headache; Dizziness; and Sinusitis   HPI Headaches and dizziness and sinus congestion Patient gets known allergic chronic sinus issues. But they have been worse recently. Along with those she has developed headaches worse in the frontal region but then of occasionally started going back to the back of her head. She has taken Advil for this and has been helping. She also complains that she woke up last night with feelings of dizziness and like the room was spinning and nauseated and then again this morning she was feeling that sensation and she actually vomited a few times after she was having it. She has never had this sensation before and she does not get migraines and she has never had vertigo before. She does admit that that both run in her family though. She denies any numbness or weakness or visual disturbances or changes in hearing or taste or smell.  Relevant past medical, surgical, family and social history reviewed and updated as indicated. Interim medical history since our last visit reviewed. Allergies and medications reviewed and updated.  Review of Systems  Constitutional: Negative for fever and chills.  HENT: Positive for rhinorrhea, sinus pressure and sneezing. Negative for congestion, ear discharge, ear pain, postnasal drip and sore throat.   Eyes: Negative for pain, redness and visual disturbance.  Respiratory: Negative for cough, chest tightness and shortness of breath.   Cardiovascular: Negative for chest pain and leg swelling.  Genitourinary: Negative for dysuria and difficulty urinating.  Musculoskeletal: Negative for back pain and gait problem.    Skin: Negative for rash.  Neurological: Positive for dizziness and headaches. Negative for tremors, seizures, syncope, speech difficulty, weakness, light-headedness and numbness.  Psychiatric/Behavioral: Negative for behavioral problems and agitation.  All other systems reviewed and are negative.   Per HPI unless specifically indicated above     Medication List       This list is accurate as of: 09/25/15  6:18 PM.  Always use your most recent med list.               citalopram 20 MG tablet  Commonly known as:  CELEXA  Take 1 tablet (20 mg total) by mouth daily.     fexofenadine 180 MG tablet  Commonly known as:  ALLEGRA  Take 180 mg by mouth daily.     Fish Oil 1000 MG Cpdr  Take by mouth daily.     fluticasone 50 MCG/ACT nasal spray  Commonly known as:  FLONASE  Place 2 sprays into both nostrils daily.     hydrochlorothiazide 12.5 MG capsule  Commonly known as:  MICROZIDE  Take 1 capsule (12.5 mg total) by mouth daily.     predniSONE 20 MG tablet  Commonly known as:  DELTASONE  2 po at same time daily for 5 days           Objective:    BP 127/79 mmHg  Pulse 82  Temp(Src) 97.8 F (36.6 C) (Oral)  Ht  (1.626 m)  Wt 171 lb 12.8 oz (77.928 kg)  BMI 29.47 kg/m2  LMP 09/25/2015  Wt Readings  from Last 3 Encounters:  09/25/15 171 lb 12.8 oz (77.928 kg)  08/06/15 174 lb (78.926 kg)  07/31/15 176 lb (79.833 kg)    Physical Exam  Constitutional: She is oriented to person, place, and time. She appears well-developed and well-nourished. No distress.  HENT:  Right Ear: Tympanic membrane, external ear and ear canal normal.  Left Ear: Tympanic membrane, external ear and ear canal normal.  Nose: Mucosal edema and rhinorrhea present. No epistaxis. Right sinus exhibits maxillary sinus tenderness. Right sinus exhibits no frontal sinus tenderness. Left sinus exhibits maxillary sinus tenderness. Left sinus exhibits no frontal sinus tenderness.  Mouth/Throat: Uvula  is midline and mucous membranes are normal. Posterior oropharyngeal edema and posterior oropharyngeal erythema present. No oropharyngeal exudate or tonsillar abscesses.  Eyes: Conjunctivae and EOM are normal. Pupils are equal, round, and reactive to light.  Neck: Neck supple. No thyromegaly present.  Cardiovascular: Normal rate, regular rhythm, normal heart sounds and intact distal pulses.   No murmur heard. Pulmonary/Chest: Effort normal and breath sounds normal. No respiratory distress. She has no wheezes.  Musculoskeletal: Normal range of motion. She exhibits no edema or tenderness.  Lymphadenopathy:    She has cervical adenopathy (Left posterior cervical lymph node that is slightly enlarged, tender to palpation and mobile).  Neurological: She is alert and oriented to person, place, and time. She has normal strength and normal reflexes. No cranial nerve deficit or sensory deficit. Coordination normal.  Skin: Skin is warm and dry. No rash noted. She is not diaphoretic.  Psychiatric: She has a normal mood and affect. Her behavior is normal.  Nursing note and vitals reviewed.      Assessment & Plan:   Problem List Items Addressed This Visit    None    Visit Diagnoses    Acute rhinosinusitis    -  Primary    Likely allergic, we'll do prednisone    Relevant Medications    predniSONE (DELTASONE) 20 MG tablet    Labyrinthitis, right        Relevant Medications    predniSONE (DELTASONE) 20 MG tablet        Follow up plan: Return if symptoms worsen or fail to improve.  Counseling provided for all of the vaccine components No orders of the defined types were placed in this encounter.    Arville Care, MD [facility] 09/25/2015, 6:18 PM

## 2015-09-30 ENCOUNTER — Ambulatory Visit (INDEPENDENT_AMBULATORY_CARE_PROVIDER_SITE_OTHER): Payer: BLUE CROSS/BLUE SHIELD | Admitting: Pediatrics

## 2015-09-30 ENCOUNTER — Encounter: Payer: Self-pay | Admitting: Pediatrics

## 2015-09-30 VITALS — BP 130/87 | HR 64 | Temp 97.6°F | Ht 64.0 in | Wt 173.2 lb

## 2015-09-30 DIAGNOSIS — R42 Dizziness and giddiness: Secondary | ICD-10-CM

## 2015-09-30 DIAGNOSIS — J0110 Acute frontal sinusitis, unspecified: Secondary | ICD-10-CM

## 2015-09-30 MED ORDER — AMOXICILLIN-POT CLAVULANATE 875-125 MG PO TABS: 1.0000 | ORAL_TABLET | Freq: Two times a day (BID) | ORAL | Status: DC

## 2015-09-30 MED ORDER — MECLIZINE HCL 12.5 MG PO TABS: 12.5000 mg | ORAL_TABLET | Freq: Three times a day (TID) | ORAL | Status: DC | PRN

## 2015-09-30 NOTE — Progress Notes (Signed)
Subjective:    Patient ID: Michelle Sellers, female    DOB: November 07, 1970, 45 y.o.   MRN: [id number]  CC: Dizziness   HPI: Michelle Sellers is a 45 y.o. female presenting for Dizziness  Having headaches for past 4 days Goes away for 15-20 min then comes back HA hurts everywhere Took 5 days of prednisone Dizziness comes and goes. All of a sudden wont be able to focus her eyes and will get nauseous. Gets the dizziness/nausea a couple times a day. Lasts for a few seconds. URI symptoms for past week Pressure in forehead when she leans forward Chills last night   Depression screen [facility] 2/9 07/31/2015 06/01/2014 10/16/2013  Decreased Interest 0 0 0  Down, Depressed, Hopeless 0 0 0  PHQ - 2 Score 0 0 0     Relevant past medical, surgical, family and social history reviewed and updated as indicated. Interim medical history since our last visit reviewed. Allergies and medications reviewed and updated.    ROS: Per HPI unless specifically indicated above  History  Smoking status  . Never Smoker   Smokeless tobacco  . Never Used    Past Medical History Patient Active Problem List   Diagnosis Date Noted  . BMI 29.0-29.9,adult 08/06/2015  . Menorrhagia with regular cycle 08/06/2014  . Peri-menopause 08/06/2014  . Essential hypertension 06/14/2014  . Hyperlipidemia 08/02/2013  . Allergic rhinitis 12/03/2012    Current Outpatient Prescriptions  Medication Sig Dispense Refill  . citalopram (CELEXA) 20 MG tablet Take 1 tablet (20 mg total) by mouth daily. 30 tablet 5  . fexofenadine (ALLEGRA) 180 MG tablet Take 180 mg by mouth daily.    . fluticasone (FLONASE) 50 MCG/ACT nasal spray Place 2 sprays into both nostrils daily. 16 g 6  . hydrochlorothiazide (MICROZIDE) 12.5 MG capsule Take 1 capsule (12.5 mg total) by mouth daily. 30 capsule 6  . Omega-3 Fatty Acids (FISH OIL) 1000 MG CPDR Take by mouth daily.    Marland Kitchen amoxicillin-clavulanate (AUGMENTIN) 875-125 MG tablet Take 1  tablet by mouth 2 (two) times daily. 20 tablet 0  . meclizine (ANTIVERT) 12.5 MG tablet Take 1 tablet (12.5 mg total) by mouth 3 (three) times daily as needed for dizziness. 30 tablet 0   No current facility-administered medications for this visit.       Objective:    BP 130/87 mmHg  Pulse 64  Temp(Src) 97.6 F (36.4 C) (Oral)  Ht  (1.626 m)  Wt 173 lb 3.2 oz (78.563 kg)  BMI 29.72 kg/m2  LMP 09/25/2015  Wt Readings from Last 3 Encounters:  09/30/15 173 lb 3.2 oz (78.563 kg)  09/25/15 171 lb 12.8 oz (77.928 kg)  08/06/15 174 lb (78.926 kg)     Gen: NAD, alert, cooperative with exam, NCAT EYES: EOMI, nystagmus present with R-ward gaze, no scleral injection or icterus ENT:  R TM dull with splayed LR, L Tm normal. OP without erythema, tender over frontal sinuses b/l LYMPH: no cervical LAD CV: NRRR, normal S1/S2, no murmur, distal pulses 2+ b/l Resp: CTABL, no wheezes, normal WOB Abd: +BS, soft, NTND. no guarding or organomegaly Ext: No edema, warm Neuro: Alert and oriented, strength equal b/l UE and LE, coordination grossly normal, some dizziness with epley, more starting to L compared to starting R-ward facing MSK: normal muscle bulk     Assessment & Plan:    Jamira was seen today for dizziness.  Diagnoses and all orders for this visit:  Dizziness Labryinthitis vs BPPV,  does have some nystagmus to R. Discussed using meclizine, now s/p prednisone. Discussed epley maneuver, will treat sinusitis, if not improving in a week or if any other neuro symptoms RTC. Meclizine prn. -     meclizine (ANTIVERT) 12.5 MG tablet; Take 1 tablet (12.5 mg total) by mouth 3 (three) times daily as needed for dizziness.  Acute frontal sinusitis, recurrence not specified Tender over frontal sinuses, worsening HA, chills last night, symptoms for over 10 days, will treat with below. -     amoxicillin-clavulanate (AUGMENTIN) 875-125 MG tablet; Take 1 tablet by mouth 2 (two) times  daily.    Follow up plan: prn  Rex Kras, MD [facility] 09/30/2015, 9:58 AM

## 2015-09-30 NOTE — Patient Instructions (Signed)
Epley Maneuver Self-Care  WHAT IS THE EPLEY MANEUVER?  The Epley maneuver is an exercise you can do to relieve symptoms of benign paroxysmal positional vertigo (BPPV). This condition is often just referred to as vertigo. BPPV is caused by the movement of tiny crystals (canaliths) inside your inner ear. The accumulation and movement of canaliths in your inner ear causes a sudden spinning sensation (vertigo) when you move your head to certain positions. Vertigo usually lasts about 30 seconds. BPPV usually occurs in just one ear. If you get vertigo when you lie on your left side, you probably have BPPV in your left ear. Your health care provider can tell you which ear is involved.   BPPV may be caused by a head injury. Many people older than 50 get BPPV for unknown reasons. If you have been diagnosed with BPPV, your health care provider may teach you how to do this maneuver. BPPV is not life threatening (benign) and usually goes away in time.   WHEN SHOULD I PERFORM THE EPLEY MANEUVER?  You can do this maneuver at home whenever you have symptoms of vertigo. You may do the Epley maneuver up to 3 times a day until your symptoms of vertigo go away.  HOW SHOULD I DO THE EPLEY MANEUVER?  1. Sit on the edge of a bed or table with your back straight. Your legs should be extended or hanging over the edge of the bed or table.    2. Turn your head halfway toward the affected ear.    3. Lie backward quickly with your head turned until you are lying flat on your back. You may want to position a pillow under your shoulders.    4. Hold this position for 30 seconds. You may experience an attack of vertigo. This is normal. Hold this position until the vertigo stops.  5. Then turn your head to the opposite direction until your unaffected ear is facing the floor.    6. Hold this position for 30 seconds. You may experience an attack of vertigo. This is normal. Hold this position until the vertigo stops.  7. Now turn your whole body to  the same side as your head. Hold for another 30 seconds.    8. You can then sit back up.  ARE THERE RISKS TO THIS MANEUVER?  In some cases, you may have other symptoms (such as changes in your vision, weakness, or numbness). If you have these symptoms, stop doing the maneuver and call your health care provider. Even if doing these maneuvers relieves your vertigo, you may still have dizziness. Dizziness is the sensation of light-headedness but without the sensation of movement. Even though the Epley maneuver may relieve your vertigo, it is possible that your symptoms will return within 5 years.  WHAT SHOULD I DO AFTER THIS MANEUVER?  After doing the Epley maneuver, you can return to your normal activities. Ask your doctor if there is anything you should do at home to prevent vertigo. This may include:  · Sleeping with two or more pillows to keep your head elevated.  · Not sleeping on the side of your affected ear.  · Getting up slowly from bed.  · Avoiding sudden movements during the day.  · Avoiding extreme head movement, like looking up or bending over.  · Wearing a cervical collar to prevent sudden head movements.  WHAT SHOULD I DO IF MY SYMPTOMS GET WORSE?  Call your health care provider if your vertigo gets worse. Call your provider right way if   you have other symptoms, including:   · Nausea.  · Vomiting.  · Headache.  · Weakness.  · Numbness.  · Vision changes.     This information is not intended to replace advice given to you by your health care provider. Make sure you discuss any questions you have with your health care provider.     Document Released: 08/29/2013 Document Reviewed: 08/29/2013  Elsevier Interactive Patient Education ©2016 [employer]vier Inc[employer]

## 2015-10-14 ENCOUNTER — Other Ambulatory Visit: Payer: Self-pay | Admitting: *Deleted

## 2015-10-14 DIAGNOSIS — F411 Generalized anxiety disorder: Secondary | ICD-10-CM

## 2015-10-14 MED ORDER — CITALOPRAM HYDROBROMIDE 20 MG PO TABS: 20.0000 mg | ORAL_TABLET | Freq: Every day | ORAL | Status: DC

## 2015-11-05 ENCOUNTER — Ambulatory Visit: Payer: BLUE CROSS/BLUE SHIELD | Admitting: Nurse Practitioner

## 2015-11-06 ENCOUNTER — Encounter: Payer: Self-pay | Admitting: Nurse Practitioner

## 2015-11-11 ENCOUNTER — Encounter: Payer: Self-pay | Admitting: Nurse Practitioner

## 2015-11-11 ENCOUNTER — Ambulatory Visit (INDEPENDENT_AMBULATORY_CARE_PROVIDER_SITE_OTHER): Payer: BLUE CROSS/BLUE SHIELD | Admitting: Nurse Practitioner

## 2015-11-11 VITALS — BP 116/82 | HR 63 | Temp 96.8°F | Ht 64.0 in | Wt 174.0 lb

## 2015-11-11 DIAGNOSIS — F411 Generalized anxiety disorder: Secondary | ICD-10-CM | POA: Diagnosis not present

## 2015-11-11 DIAGNOSIS — E785 Hyperlipidemia, unspecified: Secondary | ICD-10-CM

## 2015-11-11 DIAGNOSIS — J302 Other seasonal allergic rhinitis: Secondary | ICD-10-CM

## 2015-11-11 DIAGNOSIS — I10 Essential (primary) hypertension: Secondary | ICD-10-CM | POA: Diagnosis not present

## 2015-11-11 DIAGNOSIS — Z6829 Body mass index (BMI) 29.0-29.9, adult: Secondary | ICD-10-CM | POA: Diagnosis not present

## 2015-11-11 MED ORDER — CITALOPRAM HYDROBROMIDE 20 MG PO TABS: 20.0000 mg | ORAL_TABLET | Freq: Every day | ORAL | Status: DC

## 2015-11-11 MED ORDER — CITALOPRAM HYDROBROMIDE 40 MG PO TABS: 40.0000 mg | ORAL_TABLET | Freq: Every day | ORAL | Status: DC

## 2015-11-11 NOTE — Patient Instructions (Signed)
Fat and Cholesterol Restricted Diet High levels of fat and cholesterol in your blood may lead to various health problems, such as diseases of the heart, blood vessels, gallbladder, liver, and pancreas. Fats are concentrated sources of energy that come in various forms. Certain types of fat, including saturated fat, may be harmful in excess. Cholesterol is a substance needed by your body in small amounts. Your body makes all the cholesterol it needs. Excess cholesterol comes from the food you eat. When you have high levels of cholesterol and saturated fat in your blood, health problems can develop because the excess fat and cholesterol will gather along the walls of your blood vessels, causing them to narrow. Choosing the right foods will help you control your intake of fat and cholesterol. This will help keep the levels of these substances in your blood within normal limits and reduce your risk of disease. WHAT IS MY PLAN? Your health care provider recommends that you:  Get no more than ____20______ % of the total calories in your daily diet from fat.  Limit your intake of saturated fat to less than __10____% of your total calories each day.  Limit the amount of cholesterol in your diet to less than ___30______mg per day. WHAT TYPES OF FAT SHOULD I CHOOSE?  Choose healthy fats more often. Choose monounsaturated and polyunsaturated fats, such as olive and canola oil, flaxseeds, walnuts, almonds, and seeds.  Eat more omega-3 fats. Good choices include salmon, mackerel, sardines, tuna, flaxseed oil, and ground flaxseeds. Aim to eat fish at least two times a week.  Limit saturated fats. Saturated fats are primarily found in animal products, such as meats, butter, and cream. Plant sources of saturated fats include palm oil, palm kernel oil, and coconut oil.  Avoid foods with partially hydrogenated oils in them. These contain trans fats. Examples of foods that contain trans fats are stick margarine,  some tub margarines, cookies, crackers, and other baked goods. WHAT GENERAL GUIDELINES DO I NEED TO FOLLOW? These guidelines for healthy eating will help you control your intake of fat and cholesterol:  Check food labels carefully to identify foods with trans fats or high amounts of saturated fat.  Fill one half of your plate with vegetables and green salads.  Fill one fourth of your plate with whole grains. Look for the word "whole" as the first word in the ingredient list.  Fill one fourth of your plate with lean protein foods.  Limit fruit to two servings a day. Choose fruit instead of juice.  Eat more foods that contain soluble fiber. Examples of foods that contain this type of fiber are apples, broccoli, carrots, beans, peas, and barley. Aim to get 20-30 g of fiber per day.  Eat more home-cooked food and less restaurant, buffet, and fast food.  Limit or avoid alcohol.  Limit foods high in starch and sugar.  Limit fried foods.  Cook foods using methods other than frying. Baking, boiling, grilling, and broiling are all great options.  Lose weight if you are overweight. Losing just 5-10% of your initial body weight can help your overall health and prevent diseases such as diabetes and heart disease. WHAT FOODS CAN I EAT? Grains Whole grains, such as whole wheat or whole grain breads, crackers, cereals, and pasta. Unsweetened oatmeal, bulgur, barley, quinoa, or brown rice. Corn or whole wheat flour tortillas. Vegetables Fresh or frozen vegetables (raw, steamed, roasted, or grilled). Green salads. Fruits All fresh, canned (in natural juice), or frozen fruits. Meat and  Other Protein Products Ground beef (85% or leaner), grass-fed beef, or beef trimmed of fat. Skinless chicken or turkey. Ground chicken or turkey. Pork trimmed of fat. All fish and seafood. Eggs. Dried beans, peas, or lentils. Unsalted nuts or seeds. Unsalted canned or dry beans. Dairy Low-fat dairy products, such as  skim or 1% milk, 2% or reduced-fat cheeses, low-fat ricotta or cottage cheese, or plain low-fat yogurt. Fats and Oils Tub margarines without trans fats. Light or reduced-fat mayonnaise and salad dressings. Avocado. Olive, canola, sesame, or safflower oils. Natural peanut or almond butter (choose ones without added sugar and oil). The items listed above may not be a complete list of recommended foods or beverages. Contact your dietitian for more options. WHAT FOODS ARE NOT RECOMMENDED? Grains White bread. White pasta. White rice. Cornbread. Bagels, pastries, and croissants. Crackers that contain trans fat. Vegetables White potatoes. Corn. Creamed or fried vegetables. Vegetables in a cheese sauce. Fruits Dried fruits. Canned fruit in light or heavy syrup. Fruit juice. Meat and Other Protein Products Fatty cuts of meat. Ribs, chicken wings, bacon, sausage, bologna, salami, chitterlings, fatback, hot dogs, bratwurst, and packaged luncheon meats. Liver and organ meats. Dairy Whole or 2% milk, cream, half-and-half, and cream cheese. Whole milk cheeses. Whole-fat or sweetened yogurt. Full-fat cheeses. Nondairy creamers and whipped toppings. Processed cheese, cheese spreads, or cheese curds. Sweets and Desserts Corn syrup, sugars, honey, and molasses. Candy. Jam and jelly. Syrup. Sweetened cereals. Cookies, pies, cakes, donuts, muffins, and ice cream. Fats and Oils Butter, stick margarine, lard, shortening, ghee, or bacon fat. Coconut, palm kernel, or palm oils. Beverages Alcohol. Sweetened drinks (such as sodas, lemonade, and fruit drinks or punches). The items listed above may not be a complete list of foods and beverages to avoid. Contact your dietitian for more information.   This information is not intended to replace advice given to you by your health care provider. Make sure you discuss any questions you have with your health care provider.   Document Released: 08/24/2005 Document Revised:  09/14/2014 Document Reviewed: 11/22/2013 Elsevier Interactive Patient Education 2016 Elsevier Inc.  

## 2015-11-11 NOTE — Progress Notes (Signed)
Subjective:    Patient ID: Michelle Sellers, female    DOB: August 10, 1971, 45 y.o.   MRN: [id number]  Patient here today for follow up of chronic medical problems.  Outpatient Encounter Prescriptions as of 11/11/2015  Medication Sig  . amoxicillin-clavulanate (AUGMENTIN) 875-125 MG tablet Take 1 tablet by mouth 2 (two) times daily.  . citalopram (CELEXA) 20 MG tablet Take 1 tablet (20 mg total) by mouth daily.  . fexofenadine (ALLEGRA) 180 MG tablet Take 180 mg by mouth daily.  . fluticasone (FLONASE) 50 MCG/ACT nasal spray Place 2 sprays into both nostrils daily.  . hydrochlorothiazide (MICROZIDE) 12.5 MG capsule Take 1 capsule (12.5 mg total) by mouth daily.  . meclizine (ANTIVERT) 12.5 MG tablet Take 1 tablet (12.5 mg total) by mouth 3 (three) times daily as needed for dizziness.  . Omega-3 Fatty Acids (FISH OIL) 1000 MG CPDR Take by mouth daily.   No facility-administered encounter medications on file as of 11/11/2015.    Hyperlipidemia This is a chronic problem. The current episode started more than 1 year ago. Recent lipid tests were reviewed and are variable. She has no history of diabetes, hypothyroidism or obesity. Current antihyperlipidemic treatment includes diet change. The current treatment provides moderate improvement of lipids. There are no compliance problems.  Risk factors for coronary artery disease include dyslipidemia and hypertension.  Hypertension This is a chronic problem. The current episode started more than 1 year ago. The problem is controlled. Risk factors for coronary artery disease include dyslipidemia, post-menopausal state and sedentary lifestyle. Past treatments include nothing. The current treatment provides moderate improvement. There are no compliance problems.  There is no history of CAD/MI or CVA.  GAd Stays anxious all the time- constantly on edge and worrying. Started on celexa at last visit- needs to increase dose.   Review of Systems    Constitutional: Negative.   HENT: Positive for postnasal drip and rhinorrhea. Negative for sinus pressure.   Respiratory: Negative.  Negative for cough.   Cardiovascular: Negative.   Genitourinary: Negative.   Psychiatric/Behavioral: Negative.   All other systems reviewed and are negative.      Objective:   Physical Exam  Constitutional: She is oriented to person, place, and time. She appears well-developed and well-nourished.  HENT:  Nose: Nose normal.  Mouth/Throat: Oropharynx is clear and moist.  Eyes: EOM are normal.  Neck: Trachea normal, normal range of motion and full passive range of motion without pain. Neck supple. No JVD present. Carotid bruit is not present. No thyromegaly present.  Cardiovascular: Normal rate, regular rhythm, normal heart sounds and intact distal pulses.  Exam reveals no gallop and no friction rub.   No murmur heard. Pulmonary/Chest: Effort normal and breath sounds normal.  Abdominal: Soft. Bowel sounds are normal. She exhibits no distension and no mass. There is no tenderness.  Musculoskeletal: Normal range of motion.  Lymphadenopathy:    She has no cervical adenopathy.  Neurological: She is alert and oriented to person, place, and time. She has normal reflexes.  Skin: Skin is warm and dry.  Psychiatric: She has a normal mood and affect. Her behavior is normal. Judgment and thought content normal.    BP 116/82 mmHg  Pulse 63  Temp(Src) 96.8 F (36 C) (Oral)  Ht 5' 4"  (1.626 m)  Wt 174 lb (78.926 kg)  BMI 29.85 kg/m2       Assessment & Plan:  1. GAD (generalized anxiety disorder) Stress management celexa in creased to 54m - citalopram (CELEXA)  40 MG tablet; Take 1 tablet (20 mg total) by mouth daily.  Dispense: 90 tablet; Refill: 0  2. Essential hypertension Do not add salt to diet - CMP14+EGFR  3. Other seasonal allergic rhinitis  4. Hyperlipidemia Low fat diet - Lipid panel  5. BMI 29.0-29.9,adult Discussed diet and exercise  for person with BMI >25 Will recheck weight in 3-6 months     Labs pending Health maintenance reviewed Diet and exercise encouraged Continue all meds Follow up  In 6 months   [place], FNP

## 2015-11-12 LAB — LIPID PANEL
CHOL/HDL RATIO: 4.2 ratio (ref 0.0–4.4)
Cholesterol, Total: 161 mg/dL (ref 100–199)
HDL: 38 mg/dL — ABNORMAL LOW (ref >39)
LDL CALC: 103 mg/dL — AB (ref 0–99)
TRIGLYCERIDES: 99 mg/dL (ref 0–149)
VLDL Cholesterol Cal: 20 mg/dL (ref 5–40)

## 2015-11-12 LAB — CMP14+EGFR
A/G RATIO: 1.7 (ref 1.1–2.5)
ALBUMIN: 4.2 g/dL (ref 3.5–5.5)
ALT: 8 IU/L (ref 0–32)
AST: 12 IU/L (ref 0–40)
Alkaline Phosphatase: 49 IU/L (ref 39–117)
BUN / CREAT RATIO: 24 — AB (ref 9–23)
BUN: 16 mg/dL (ref 6–24)
Bilirubin Total: 0.5 mg/dL (ref 0.0–1.2)
CALCIUM: 9.1 mg/dL (ref 8.7–10.2)
CO2: 24 mmol/L (ref 18–29)
CREATININE: 0.68 mg/dL (ref 0.57–1.00)
Chloride: 103 mmol/L (ref 96–106)
GFR, EST AFRICAN AMERICAN: 121 mL/min/{1.73_m2} (ref >59)
GFR, EST NON AFRICAN AMERICAN: 105 mL/min/{1.73_m2} (ref >59)
GLOBULIN, TOTAL: 2.5 g/dL (ref 1.5–4.5)
Glucose: 94 mg/dL (ref 65–99)
POTASSIUM: 4 mmol/L (ref 3.5–5.2)
SODIUM: 139 mmol/L (ref 134–144)
TOTAL PROTEIN: 6.7 g/dL (ref 6.0–8.5)

## 2015-12-06 ENCOUNTER — Encounter: Payer: Self-pay | Admitting: Nurse Practitioner

## 2015-12-06 ENCOUNTER — Ambulatory Visit (INDEPENDENT_AMBULATORY_CARE_PROVIDER_SITE_OTHER): Payer: BLUE CROSS/BLUE SHIELD

## 2015-12-06 ENCOUNTER — Ambulatory Visit (HOSPITAL_COMMUNITY)
Admission: RE | Admit: 2015-12-06 | Discharge: 2015-12-06 | Disposition: A | Discharge status: Home / Self Care | Payer: BLUE CROSS/BLUE SHIELD | Source: Ambulatory Visit | Attending: Nurse Practitioner | Admitting: Nurse Practitioner

## 2015-12-06 ENCOUNTER — Ambulatory Visit (INDEPENDENT_AMBULATORY_CARE_PROVIDER_SITE_OTHER): Payer: BLUE CROSS/BLUE SHIELD | Admitting: Nurse Practitioner

## 2015-12-06 VITALS — BP 122/86 | HR 66 | Temp 97.9°F | Ht 64.0 in | Wt 174.0 lb

## 2015-12-06 DIAGNOSIS — N200 Calculus of kidney: Secondary | ICD-10-CM

## 2015-12-06 DIAGNOSIS — K5730 Diverticulosis of large intestine without perforation or abscess without bleeding: Secondary | ICD-10-CM | POA: Insufficient documentation

## 2015-12-06 DIAGNOSIS — R1031 Right lower quadrant pain: Secondary | ICD-10-CM

## 2015-12-06 DIAGNOSIS — K7689 Other specified diseases of liver: Secondary | ICD-10-CM | POA: Diagnosis not present

## 2015-12-06 LAB — URINALYSIS, COMPLETE
BILIRUBIN UA: NEGATIVE
Glucose, UA: NEGATIVE
Ketones, UA: NEGATIVE
NITRITE UA: NEGATIVE
Protein, UA: NEGATIVE
SPEC GRAV UA: 1.025 (ref 1.005–1.030)
Urobilinogen, Ur: 0.2 mg/dL (ref 0.2–1.0)
pH, UA: 6.5 (ref 5.0–7.5)

## 2015-12-06 LAB — MICROSCOPIC EXAMINATION

## 2015-12-06 NOTE — Progress Notes (Signed)
   Subjective:    Patient ID: Michelle Sellers, female    DOB: [birth year], 45 y.o.   MRN: [id number]  HPI Patient in c/o right flank pain that started 3 days ago- pain is intermittent- rates pain 7/10- advil helps temporarily - move around- no relationship to eating. Very fatigued. Slight nausea but no change in appetite.      Review of Systems  Constitutional: Negative.   HENT: Negative.   Respiratory: Negative for shortness of breath.   Cardiovascular: Negative for chest pain and palpitations.  Genitourinary: Negative.   Neurological: Negative.   Psychiatric/Behavioral: Negative.   All other systems reviewed and are negative.      Objective:   Physical Exam  Constitutional: She is oriented to person, place, and time. She appears well-developed and well-nourished. No distress.  Cardiovascular: Normal rate, regular rhythm and normal heart sounds.   Pulmonary/Chest: Effort normal and breath sounds normal.  Neurological: She is alert and oriented to person, place, and time.  Skin: Skin is warm.  Psychiatric: She has a normal mood and affect. Her behavior is normal. Judgment and thought content normal.   BP 122/86 mmHg  Pulse 66  Temp(Src) 97.9 F (36.6 C) (Oral)  Ht 5\' 4"  (1.626 m)  Wt 174 lb (78.926 kg)  BMI 29.85 kg/m2  LMP 11/14/2015   KUB- right urteral calculi- Preliminary reading by Paulene FloorMary [place], FNP  [facility]  Urine- hematuria     Assessment & Plan:   1. RLQ abdominal pain   2. Kidney stone on right side    Patient refuses pain meds Force fluids Orders Placed This Encounter  Procedures  . DG Abd 1 View    Standing Status: Future     Number of Occurrences: 1     Standing Expiration Date: 02/04/2017    Order Specific Question:  Reason for Exam (SYMPTOM  OR DIAGNOSIS REQUIRED)    Answer:  RLQ pain    Order Specific Question:  Is the patient pregnant?    Answer:  No    Order Specific Question:  Preferred imaging location?    Answer:  Internal  . CT RENAL  STONE STUDY    Standing Status: Future     Number of Occurrences:      Standing Expiration Date: 03/06/2017    Order Specific Question:  Reason for Exam (SYMPTOM  OR DIAGNOSIS REQUIRED)    Answer:  right uteral calculi    Order Specific Question:  Is the patient pregnant?    Answer:  No    Order Specific Question:  Preferred imaging location?    Answer:  [facility]  . Urinalysis, Complete   Mary-Margaret Daphine DeutscherMartin, FNP

## 2015-12-06 NOTE — Patient Instructions (Signed)
Kidney Stones °Kidney stones (urolithiasis) are deposits that form inside your kidneys. The intense pain is caused by the stone moving through the urinary tract. When the stone moves, the ureter goes into spasm around the stone. The stone is usually passed in the urine.  °CAUSES  °· A disorder that makes certain neck glands produce too much parathyroid hormone (primary hyperparathyroidism). °· A buildup of uric acid crystals, similar to gout in your joints. °· Narrowing (stricture) of the ureter. °· A kidney obstruction present at birth (congenital obstruction). °· Previous surgery on the kidney or ureters. °· Numerous kidney infections. °SYMPTOMS  °· Feeling sick to your stomach (nauseous). °· Throwing up (vomiting). °· Blood in the urine (hematuria). °· Pain that usually spreads (radiates) to the groin. °· Frequency or urgency of urination. °DIAGNOSIS  °· Taking a history and physical exam. °· Blood or urine tests. °· CT scan. °· Occasionally, an examination of the inside of the urinary bladder (cystoscopy) is performed. °TREATMENT  °· Observation. °· Increasing your fluid intake. °· Extracorporeal shock wave lithotripsy--This is a noninvasive procedure that uses shock waves to break up kidney stones. °· Surgery may be needed if you have severe pain or persistent obstruction. There are various surgical procedures. Most of the procedures are performed with the use of small instruments. Only small incisions are needed to accommodate these instruments, so recovery time is minimized. °The size, location, and chemical composition are all important variables that will determine the proper choice of action for you. Talk to your health care provider to better understand your situation so that you will minimize the risk of injury to yourself and your kidney.  °HOME CARE INSTRUCTIONS  °· Drink enough water and fluids to keep your urine clear or pale yellow. This will help you to pass the stone or stone fragments. °· Strain  all urine through the provided strainer. Keep all particulate matter and stones for your health care provider to see. The stone causing the pain may be as small as a grain of salt. It is very important to use the strainer each and every time you pass your urine. The collection of your stone will allow your health care provider to analyze it and verify that a stone has actually passed. The stone analysis will often identify what you can do to reduce the incidence of recurrences. °· Only take over-the-counter or prescription medicines for pain, discomfort, or fever as directed by your health care provider. °· Keep all follow-up visits as told by your health care provider. This is important. °· Get follow-up X-rays if required. The absence of pain does not always mean that the stone has passed. It may have only stopped moving. If the urine remains completely obstructed, it can cause loss of kidney function or even complete destruction of the kidney. It is your responsibility to make sure X-rays and follow-ups are completed. Ultrasounds of the kidney can show blockages and the status of the kidney. Ultrasounds are not associated with any radiation and can be performed easily in a matter of minutes. °· Make changes to your daily diet as told by your health care provider. You may be told to: °¨ Limit the amount of salt that you eat. °¨ Eat 5 or more servings of fruits and vegetables each day. °¨ Limit the amount of meat, poultry, fish, and eggs that you eat. °· Collect a 24-hour urine sample as told by your health care provider. You may need to collect another urine sample every 6-12   months. °SEEK MEDICAL CARE IF: °· You experience pain that is progressive and unresponsive to any pain medicine you have been prescribed. °SEEK IMMEDIATE MEDICAL CARE IF:  °· Pain cannot be controlled with the prescribed medicine. °· You have a fever or shaking chills. °· The severity or intensity of pain increases over 18 hours and is not  relieved by pain medicine. °· You develop a new onset of abdominal pain. °· You feel faint or pass out. °· You are unable to urinate. °  °This information is not intended to replace advice given to you by your health care provider. Make sure you discuss any questions you have with your health care provider. °  °Document Released: 08/24/2005 Document Revised: 05/15/2015 Document Reviewed: 01/25/2013 °Elsevier Interactive Patient Education ©2016 [employer] ° °

## 2015-12-09 ENCOUNTER — Ambulatory Visit (HOSPITAL_COMMUNITY): Payer: BLUE CROSS/BLUE SHIELD

## 2016-01-02 DIAGNOSIS — Z1231 Encounter for screening mammogram for malignant neoplasm of breast: Secondary | ICD-10-CM | POA: Diagnosis not present

## 2016-01-14 ENCOUNTER — Encounter: Payer: Self-pay | Admitting: Adult Health

## 2016-05-20 ENCOUNTER — Telehealth: Payer: Self-pay | Admitting: Nurse Practitioner

## 2016-05-20 NOTE — Telephone Encounter (Signed)
Elevated BP with h/as appt scheduled

## 2016-05-21 ENCOUNTER — Ambulatory Visit (INDEPENDENT_AMBULATORY_CARE_PROVIDER_SITE_OTHER): Payer: BLUE CROSS/BLUE SHIELD | Admitting: Nurse Practitioner

## 2016-05-21 ENCOUNTER — Encounter: Payer: Self-pay | Admitting: Nurse Practitioner

## 2016-05-21 VITALS — BP 125/91 | HR 67 | Temp 97.8°F | Ht 64.0 in | Wt 178.0 lb

## 2016-05-21 DIAGNOSIS — I10 Essential (primary) hypertension: Secondary | ICD-10-CM

## 2016-05-21 DIAGNOSIS — R5383 Other fatigue: Secondary | ICD-10-CM | POA: Diagnosis not present

## 2016-05-21 MED ORDER — HYDROCHLOROTHIAZIDE 25 MG PO TABS: 25.0000 mg | ORAL_TABLET | Freq: Every day | ORAL | 1 refills | Status: DC

## 2016-05-21 NOTE — Patient Instructions (Signed)
Hypertension Hypertension, commonly called high blood pressure, is when the force of blood pumping through your arteries is too strong. Your arteries are the blood vessels that carry blood from your heart throughout your body. A blood pressure reading consists of a higher number over a lower number, such as 110/72. The higher number (systolic) is the pressure inside your arteries when your heart pumps. The lower number (diastolic) is the pressure inside your arteries when your heart relaxes. Ideally you want your blood pressure below 120/80. Hypertension forces your heart to work harder to pump blood. Your arteries may become narrow or stiff. Having untreated or uncontrolled hypertension can cause heart attack, stroke, kidney disease, and other problems. RISK FACTORS Some risk factors for high blood pressure are controllable. Others are not.  Risk factors you cannot control include:   Race. You may be at higher risk if you are African American.  Age. Risk increases with age.  Gender. Men are at higher risk than women before age 45 years. After age 65, women are at higher risk than men. Risk factors you can control include:  Not getting enough exercise or physical activity.  Being overweight.  Getting too much fat, sugar, calories, or salt in your diet.  Drinking too much alcohol. SIGNS AND SYMPTOMS Hypertension does not usually cause signs or symptoms. Extremely high blood pressure (hypertensive crisis) may cause headache, anxiety, shortness of breath, and nosebleed. DIAGNOSIS To check if you have hypertension, your health care provider will measure your blood pressure while you are seated, with your arm held at the level of your heart. It should be measured at least twice using the same arm. Certain conditions can cause a difference in blood pressure between your right and left arms. A blood pressure reading that is higher than normal on one occasion does not mean that you need treatment. If  it is not clear whether you have high blood pressure, you may be asked to return on a different day to have your blood pressure checked again. Or, you may be asked to monitor your blood pressure at home for 1 or more weeks. TREATMENT Treating high blood pressure includes making lifestyle changes and possibly taking medicine. Living a healthy lifestyle can help lower high blood pressure. You may need to change some of your habits. Lifestyle changes may include:  Following the DASH diet. This diet is high in fruits, vegetables, and whole grains. It is low in salt, red meat, and added sugars.  Keep your sodium intake below 2,300 mg per day.  Getting at least 30-45 minutes of aerobic exercise at least 4 times per week.  Losing weight if necessary.  Not smoking.  Limiting alcoholic beverages.  Learning ways to reduce stress. Your health care provider may prescribe medicine if lifestyle changes are not enough to get your blood pressure under control, and if one of the following is true:  You are 18-59 years of age and your systolic blood pressure is above 140.  You are 60 years of age or older, and your systolic blood pressure is above 150.  Your diastolic blood pressure is above 90.  You have diabetes, and your systolic blood pressure is over 140 or your diastolic blood pressure is over 90.  You have kidney disease and your blood pressure is above 140/90.  You have heart disease and your blood pressure is above 140/90. Your personal target blood pressure may vary depending on your medical conditions, your age, and other factors. HOME CARE INSTRUCTIONS    Have your blood pressure rechecked as directed by your health care provider.   Take medicines only as directed by your health care provider. Follow the directions carefully. Blood pressure medicines must be taken as prescribed. The medicine does not work as well when you skip doses. Skipping doses also puts you at risk for  problems.  Do not smoke.   Monitor your blood pressure at home as directed by your health care provider. SEEK MEDICAL CARE IF:   You think you are having a reaction to medicines taken.  You have recurrent headaches or feel dizzy.  You have swelling in your ankles.  You have trouble with your vision. SEEK IMMEDIATE MEDICAL CARE IF:  You develop a severe headache or confusion.  You have unusual weakness, numbness, or feel faint.  You have severe chest or abdominal pain.  You vomit repeatedly.  You have trouble breathing. MAKE SURE YOU:   Understand these instructions.  Will watch your condition.  Will get help right away if you are not doing well or get worse.   This information is not intended to replace advice given to you by your health care provider. Make sure you discuss any questions you have with your health care provider.   Document Released: 08/24/2005 Document Revised: 01/08/2015 Document Reviewed: 06/16/2013 Elsevier Interactive Patient Education 2016 [employer] Inc.  

## 2016-05-21 NOTE — Progress Notes (Signed)
   Subjective:    Patient ID: Michelle Sellers, female    DOB: 1971-07-06, 45 y.o.   MRN: [id number]  HPI Patient comes in today c/o elevated blood pressure- mainly the bottom #number.SHe is on HCTZ 12.24m daily. SHe has had a dull headache for several days. She also c/o fatigue. Has no energy to do anything. Denies tick bite that she is aware of.    Review of Systems  Constitutional: Positive for fatigue.  HENT: Positive for congestion. Negative for ear discharge, sinus pressure, sore throat and trouble swallowing.   Eyes: Negative for pain and visual disturbance.  Respiratory: Negative.   Cardiovascular: Negative.   Gastrointestinal: Negative.   Genitourinary: Negative.   Neurological: Negative.   Psychiatric/Behavioral: Negative.   All other systems reviewed and are negative.      Objective:   Physical Exam  Constitutional: She appears well-developed and well-nourished. No distress.  HENT:  Right Ear: Hearing, tympanic membrane, external ear and ear canal normal.  Left Ear: Hearing, tympanic membrane, external ear and ear canal normal.  Nose: Mucosal edema and rhinorrhea present. Right sinus exhibits no maxillary sinus tenderness and no frontal sinus tenderness. Left sinus exhibits no maxillary sinus tenderness and no frontal sinus tenderness.  Mouth/Throat: Uvula is midline, oropharynx is clear and moist and mucous membranes are normal.  Cardiovascular: Normal rate, regular rhythm and normal heart sounds.   Pulmonary/Chest: Effort normal.   BP (!) 125/91   Pulse 67   Temp 97.8 F (36.6 C) (Oral)   Ht _0  (1.626 m)   Wt 178 lb (80.7 kg)   BMI 30.55 kg/m         Assessment & Plan:  1. Essential hypertension Do not add salt to diet Increased HCTZ to 243mdaily Keep diary of blood pressure - hydrochlorothiazide (HYDRODIURIL) 25 MG tablet; Take 1 tablet (25 mg total) by mouth daily.  Dispense: 90 tablet; Refill: 1 - CMP14+EGFR  2. Other fatigue Labs  pending - CBC with Differential/Platelet - Thyroid Panel With TSH - Lyme Ab/Western Blot Reflex    Labs pending Health maintenance reviewed Diet and exercise encouraged Continue all meds Follow up  In 6 months and prn   Mary-Margaret MaHassell DoneFNP

## 2016-05-22 LAB — CMP14+EGFR
A/G RATIO: 1.6 (ref 1.2–2.2)
ALT: 11 IU/L (ref 0–32)
AST: 12 IU/L (ref 0–40)
Albumin: 4.1 g/dL (ref 3.5–5.5)
Alkaline Phosphatase: 57 IU/L (ref 39–117)
BUN/Creatinine Ratio: 21 (ref 9–23)
BUN: 15 mg/dL (ref 6–24)
Bilirubin Total: 0.3 mg/dL (ref 0.0–1.2)
CALCIUM: 9 mg/dL (ref 8.7–10.2)
CHLORIDE: 99 mmol/L (ref 96–106)
CO2: 25 mmol/L (ref 18–29)
Creatinine, Ser: 0.72 mg/dL (ref 0.57–1.00)
GFR calc Af Amer: 116 mL/min/{1.73_m2} (ref >59)
GFR, EST NON AFRICAN AMERICAN: 101 mL/min/{1.73_m2} (ref >59)
Globulin, Total: 2.6 g/dL (ref 1.5–4.5)
Glucose: 83 mg/dL (ref 65–99)
POTASSIUM: 3.9 mmol/L (ref 3.5–5.2)
Sodium: 139 mmol/L (ref 134–144)
Total Protein: 6.7 g/dL (ref 6.0–8.5)

## 2016-05-22 LAB — CBC WITH DIFFERENTIAL/PLATELET
Basophils Absolute: 0.1 10*3/uL (ref 0.0–0.2)
Basos: 1 %
EOS (ABSOLUTE): 0.1 10*3/uL (ref 0.0–0.4)
Eos: 1 %
Hematocrit: 36.4 % (ref 34.0–46.6)
Hemoglobin: 12 g/dL (ref 11.1–15.9)
IMMATURE GRANULOCYTES: 0 %
Immature Grans (Abs): 0 10*3/uL (ref 0.0–0.1)
Lymphocytes Absolute: 2.4 10*3/uL (ref 0.7–3.1)
Lymphs: 30 %
MCH: 28.2 pg (ref 26.6–33.0)
MCHC: 33 g/dL (ref 31.5–35.7)
MCV: 85 fL (ref 79–97)
MONOS ABS: 0.6 10*3/uL (ref 0.1–0.9)
Monocytes: 8 %
NEUTROS PCT: 60 %
Neutrophils Absolute: 4.7 10*3/uL (ref 1.4–7.0)
PLATELETS: 218 10*3/uL (ref 150–379)
RBC: 4.26 x10E6/uL (ref 3.77–5.28)
RDW: 14.1 % (ref 12.3–15.4)
WBC: 7.8 10*3/uL (ref 3.4–10.8)

## 2016-05-22 LAB — THYROID PANEL WITH TSH
Free Thyroxine Index: 1.8 (ref 1.2–4.9)
T3 UPTAKE RATIO: 26 % (ref 24–39)
T4 TOTAL: 6.9 ug/dL (ref 4.5–12.0)
TSH: 0.902 u[IU]/mL (ref 0.450–4.500)

## 2016-05-22 LAB — LYME AB/WESTERN BLOT REFLEX: Lyme IgG/IgM Ab: <0.91 {ISR} (ref 0.00–0.90)

## 2016-05-25 ENCOUNTER — Ambulatory Visit: Payer: BLUE CROSS/BLUE SHIELD | Admitting: Nurse Practitioner

## 2016-06-02 ENCOUNTER — Other Ambulatory Visit: Payer: Self-pay | Admitting: Nurse Practitioner

## 2016-06-02 ENCOUNTER — Telehealth: Payer: Self-pay | Admitting: Nurse Practitioner

## 2016-06-02 MED ORDER — AZITHROMYCIN 250 MG PO TABS
ORAL_TABLET | ORAL | 0 refills | Status: DC
Start: 2016-06-02 — End: 2016-06-17

## 2016-06-02 NOTE — Telephone Encounter (Signed)
zpak sent to pharmacy 

## 2016-06-03 NOTE — Telephone Encounter (Signed)
Patient aware.

## 2016-06-17 ENCOUNTER — Ambulatory Visit (INDEPENDENT_AMBULATORY_CARE_PROVIDER_SITE_OTHER): Payer: BLUE CROSS/BLUE SHIELD | Admitting: Adult Health

## 2016-06-17 ENCOUNTER — Other Ambulatory Visit (HOSPITAL_COMMUNITY)
Admission: RE | Admit: 2016-06-17 | Discharge: 2016-06-17 | Disposition: A | Discharge status: Home / Self Care | Payer: BLUE CROSS/BLUE SHIELD | Source: Ambulatory Visit | Attending: Adult Health | Admitting: Adult Health

## 2016-06-17 ENCOUNTER — Encounter: Payer: Self-pay | Admitting: Adult Health

## 2016-06-17 VITALS — BP 112/60 | HR 64 | Ht 64.0 in | Wt 175.5 lb

## 2016-06-17 DIAGNOSIS — Z1151 Encounter for screening for human papillomavirus (HPV): Secondary | ICD-10-CM | POA: Diagnosis not present

## 2016-06-17 DIAGNOSIS — Z1212 Encounter for screening for malignant neoplasm of rectum: Secondary | ICD-10-CM | POA: Diagnosis not present

## 2016-06-17 DIAGNOSIS — R232 Flushing: Secondary | ICD-10-CM

## 2016-06-17 DIAGNOSIS — Z01419 Encounter for gynecological examination (general) (routine) without abnormal findings: Secondary | ICD-10-CM

## 2016-06-17 DIAGNOSIS — R102 Pelvic and perineal pain: Secondary | ICD-10-CM

## 2016-06-17 DIAGNOSIS — N920 Excessive and frequent menstruation with regular cycle: Secondary | ICD-10-CM

## 2016-06-17 DIAGNOSIS — N951 Menopausal and female climacteric states: Secondary | ICD-10-CM

## 2016-06-17 LAB — HEMOCCULT GUIAC POC 1CARD (OFFICE): FECAL OCCULT BLD: NEGATIVE

## 2016-06-17 NOTE — Patient Instructions (Signed)
Menopause Menopause is the normal time of life when menstrual periods stop completely. Menopause is complete when you have missed 12 consecutive menstrual periods. It usually occurs between the ages of 48 years and 55 years. Very rarely does a woman develop menopause before the age of 40 years. At menopause, your ovaries stop producing the female hormones estrogen and progesterone. This can cause undesirable symptoms and also affect your health. Sometimes the symptoms may occur 4-5 years before the menopause begins. There is no relationship between menopause and:  Oral contraceptives.  Number of children you had.  Race.  The age your menstrual periods started (menarche). Heavy smokers and very thin women may develop menopause earlier in life. CAUSES  The ovaries stop producing the female hormones estrogen and progesterone.  Other causes include:  Surgery to remove both ovaries.  The ovaries stop functioning for no known reason.  Tumors of the pituitary gland in the brain.  Medical disease that affects the ovaries and hormone production.  Radiation treatment to the abdomen or pelvis.  Chemotherapy that affects the ovaries. SYMPTOMS   Hot flashes.  Night sweats.  Decrease in sex drive.  Vaginal dryness and thinning of the vagina causing painful intercourse.  Dryness of the skin and developing wrinkles.  Headaches.  Tiredness.  Irritability.  Memory problems.  Weight gain.  Bladder infections.  Hair growth of the face and chest.  Infertility. More serious symptoms include:  Loss of bone (osteoporosis) causing breaks (fractures).  Depression.  Hardening and narrowing of the arteries (atherosclerosis) causing heart attacks and strokes. DIAGNOSIS   When the menstrual periods have stopped for 12 straight months.  Physical exam.  Hormone studies of the blood. TREATMENT  There are many treatment choices and nearly as many questions about them. The  decisions to treat or not to treat menopausal changes is an individual choice made with your health care provider. Your health care provider can discuss the treatments with you. Together, you can decide which treatment will work best for you. Your treatment choices may include:   Hormone therapy (estrogen and progesterone).  Non-hormonal medicines.  Treating the individual symptoms with medicine (for example antidepressants for depression).  Herbal medicines that may help specific symptoms.  Counseling by a psychiatrist or psychologist.  Group therapy.  Lifestyle changes including:  Eating healthy.  Regular exercise.  Limiting caffeine and alcohol.  Stress management and meditation.  No treatment. HOME CARE INSTRUCTIONS   Take the medicine your health care provider gives you as directed.  Get plenty of sleep and rest.  Exercise regularly.  Eat a diet that contains calcium (good for the bones) and soy products (acts like estrogen hormone).  Avoid alcoholic beverages.  Do not smoke.  If you have hot flashes, dress in layers.  Take supplements, calcium, and vitamin D to strengthen bones.  You can use over-the-counter lubricants or moisturizers for vaginal dryness.  Group therapy is sometimes very helpful.  Acupuncture may be helpful in some cases. SEEK MEDICAL CARE IF:   You are not sure you are in menopause.  You are having menopausal symptoms and need advice and treatment.  You are still having menstrual periods after age 55 years.  You have pain with intercourse.  Menopause is complete (no menstrual period for 12 months) and you develop vaginal bleeding.  You need a referral to a specialist (gynecologist, psychiatrist, or psychologist) for treatment. SEEK IMMEDIATE MEDICAL CARE IF:   You have severe depression.  You have excessive vaginal bleeding.    You fell and think you have a broken bone.  You have pain when you urinate.  You develop leg or  chest pain.  You have a fast pounding heart beat (palpitations).  You have severe headaches.  You develop vision problems.  You feel a lump in your breast.  You have abdominal pain or severe indigestion.   This information is not intended to replace advice given to you by your health care provider. Make sure you discuss any questions you have with your health care provider.   Document Released: 11/14/2003 Document Revised: 04/26/2013 Document Reviewed: 03/23/2013 Elsevier Interactive Patient Education [employer] Menorrhagia Menorrhagia is the medical term for when your menstrual periods are heavy or last longer than usual. With menorrhagia, every period you have may cause enough blood loss and cramping that you are unable to maintain your usual activities. CAUSES  In some cases, the cause of heavy periods is unknown, but a number of conditions may cause menorrhagia. Common causes include:  A problem with the hormone-producing thyroid gland (hypothyroid).  Noncancerous growths in the uterus (polyps or fibroids).  An imbalance of the estrogen and progesterone hormones.  One of your ovaries not releasing an egg during one or more months.  Side effects of having an intrauterine device (IUD).  Side effects of some medicines, such as anti-inflammatory medicines or blood thinners.  A bleeding disorder that stops your blood from clotting normally. SIGNS AND SYMPTOMS  During a normal period, bleeding lasts between 4 and 8 days. Signs that your periods are too heavy include:  You routinely have to change your pad or tampon every 1 or 2 hours because it is completely soaked.  You pass blood clots larger than 1 inch (2.5 cm) in size.  You have bleeding for more than 7 days.  You need to use pads and tampons at the same time because of heavy bleeding.  You need to wake up to change your pads or tampons during the night.  You have symptoms of anemia, such as tiredness,  fatigue, or shortness of breath. DIAGNOSIS  Your health care provider will perform a physical exam and ask you questions about your symptoms and menstrual history. Other tests may be ordered based on what the health care provider finds during the exam. These tests can include:  Blood tests. Blood tests are used to check if you are pregnant or have hormonal changes, a bleeding or thyroid disorder, low iron levels (anemia), or other problems.  Endometrial biopsy. Your health care provider takes a sample of tissue from the inside of your uterus to be examined under a microscope.  Pelvic ultrasound. This test uses sound waves to make a picture of your uterus, ovaries, and vagina. The pictures can show if you have fibroids or other growths.  Hysteroscopy. For this test, your health care provider will use a small telescope to look inside your uterus. Based on the results of your initial tests, your health care provider may recommend further testing. TREATMENT  Treatment may not be needed. If it is needed, your health care provider may recommend treatment with one or more medicines first. If these do not reduce bleeding enough, a surgical treatment might be an option. The best treatment for you will depend on:   Whether you need to prevent pregnancy.  Your desire to have children in the future.  The cause and severity of your bleeding.  Your opinion and personal preference.  Medicines for menorrhagia may include:  Birth control  methods that use hormones. These include the pill, skin patch, vaginal ring, shots that you get every 3 months, hormonal IUD, and implant. These treatments reduce bleeding during your menstrual period.  Medicines that thicken blood and slow bleeding.  Medicines that reduce swelling, such as ibuprofen.  Medicines that contain a synthetic hormone called progestin.   Medicines that make the ovaries stop working for a short time.  You may need surgical treatment  for menorrhagia if the medicines are unsuccessful. Treatment options include:  Dilation and curettage (D&C). In this procedure, your health care provider opens (dilates) your cervix and then scrapes or suctions tissue from the lining of your uterus to reduce menstrual bleeding.  Operative hysteroscopy. This procedure uses a tiny tube with a light (hysteroscope) to view your uterine cavity and can help in the surgical removal of a polyp that may be causing heavy periods.  Endometrial ablation. Through various techniques, your health care provider permanently destroys the entire lining of your uterus (endometrium). After endometrial ablation, most women have little or no menstrual flow. Endometrial ablation reduces your ability to become pregnant.  Endometrial resection. This surgical procedure uses an electrosurgical wire loop to remove the lining of the uterus. This procedure also reduces your ability to become pregnant.  Hysterectomy. Surgical removal of the uterus and cervix is a permanent procedure that stops menstrual periods. Pregnancy is not possible after a hysterectomy. This procedure requires anesthesia and hospitalization. HOME CARE INSTRUCTIONS   Only take over-the-counter or prescription medicines as directed by your health care provider. Take prescribed medicines exactly as directed. Do not change or switch medicines without consulting your health care provider.  Take any prescribed iron pills exactly as directed by your health care provider. Long-term heavy bleeding may result in low iron levels. Iron pills help replace the iron your body lost from heavy bleeding. Iron may cause constipation. If this becomes a problem, increase the bran, fruits, and roughage in your diet.  Do not take aspirin or medicines that contain aspirin 1 week before or during your menstrual period. Aspirin may make the bleeding worse.  If you need to change your sanitary pad or tampon more than once every 2  hours, stay in bed and rest as much as possible until the bleeding stops.  Eat well-balanced meals. Eat foods high in iron. Examples are leafy green vegetables, meat, liver, eggs, and whole grain breads and cereals. Do not try to lose weight until the abnormal bleeding has stopped and your blood iron level is back to normal. SEEK MEDICAL CARE IF:   You soak through a pad or tampon every 1 or 2 hours, and this happens every time you have a period.  You need to use pads and tampons at the same time because you are bleeding so much.  You need to change your pad or tampon during the night.  You have a period that lasts for more than 8 days.  You pass clots bigger than 1 inch wide.  You have irregular periods that happen more or less often than once a month.  You feel dizzy or faint.  You feel very weak or tired.  You feel short of breath or feel your heart is beating too fast when you exercise.  You have nausea and vomiting or diarrhea while you are taking your medicine.  You have any problems that may be related to the medicine you are taking. SEEK IMMEDIATE MEDICAL CARE IF:   You soak through 4 or more  pads or tampons in 2 hours.  You have any bleeding while you are pregnant. MAKE SURE YOU:   Understand these instructions.  Will watch your condition.  Will get help right away if you are not doing well or get worse.   This information is not intended to replace advice given to you by your health care provider. Make sure you discuss any questions you have with your health care provider.   Document Released: 08/24/2005 Document Revised: 08/29/2013 Document Reviewed: 02/12/2013 Elsevier Interactive Patient Education [employer] Physical in  1year, pap in 3 if normal Mammogram yearly Return in 1 week for [place]

## 2016-06-17 NOTE — Progress Notes (Signed)
Patient ID: Michelle Sellers, female   DOB: 10-Apr-1971, 45 y.o.   MRN: [id number] History of Present Illness: Michelle Sellers is a 45 year old female, married in for a well woman gyn exam and pap.She is complaining of pain in lower abdomen and heavy periods, with clots and hot hot flashes, feels bloated at times.She is on celexa for ? Anxiety and it helps.She has had labs at PCP this year.  PCP is [facility].   Current Medications, Allergies, Past Medical History, Past Surgical History, Family History and Social History were reviewed in [facility] electronic medical record.     Review of Systems:  Patient denies any headaches, hearing loss, fatigue, blurred vision, shortness of breath, chest pain,  problems with bowel movements, urination, or intercourse. No joint pain or mood swings. See HPI for positives.  Physical Exam:BP 112/60 (BP Location: Left Arm, Patient Position: Sitting, Cuff Size: Normal)   Pulse 64   Ht 5\' 4"  (1.626 m)   Wt 175 lb 8 oz (79.6 kg)   LMP 06/13/2016 (Exact Date)   BMI 30.12 kg/m General:  Well developed, well nourished, no acute distress Skin:  Warm and dry Neck:  Midline trachea, normal thyroid, good ROM, no lymphadenopathy Lungs; Clear to auscultation bilaterally Breast:  No dominant palpable mass, retraction, or nipple discharge Cardiovascular: Regular rate and rhythm Abdomen:  Soft, non tender, no hepatosplenomegaly Pelvic:  External genitalia is normal in appearance, no lesions.  The vagina is normal in appearance. Urethra has no lesions or masses. The cervix is bulbous. Pap with HPV performed. Uterus is felt to be normal size, shape, and contour.  No adnexal masses or tenderness noted.Bladder is non tender, no masses felt. Rectal: Good sphincter tone, no polyps, or hemorrhoids felt.  Hemoccult negative. Extremities/musculoskeletal:  No swelling or varicosities noted, no clubbing or cyanosis Psych:  No mood changes, alert and cooperative,seems  happy PHQ 2 score 0.  Impression: 1. Encounter for gynecological examination with Papanicolaou smear of cervix   2. Pelvic pain in female   3. Hot flashes   4. Peri-menopause   5. Menorrhagia with regular cycle       Plan:  Return in 1 week for GYN US Physical in 1 year, pap in 3 if normal Mammogram yearly  Review handouts on menorrhagia and menopause

## 2016-06-18 LAB — CYTOLOGY - PAP

## 2016-06-22 ENCOUNTER — Ambulatory Visit (INDEPENDENT_AMBULATORY_CARE_PROVIDER_SITE_OTHER): Payer: BLUE CROSS/BLUE SHIELD

## 2016-06-22 DIAGNOSIS — N854 Malposition of uterus: Secondary | ICD-10-CM | POA: Diagnosis not present

## 2016-06-22 DIAGNOSIS — N920 Excessive and frequent menstruation with regular cycle: Secondary | ICD-10-CM

## 2016-06-22 DIAGNOSIS — N858 Other specified noninflammatory disorders of uterus: Secondary | ICD-10-CM

## 2016-06-22 DIAGNOSIS — R102 Pelvic and perineal pain: Secondary | ICD-10-CM | POA: Diagnosis not present

## 2016-06-22 NOTE — Progress Notes (Signed)
PELVIC US TA/TV: Heterogeneous anteverted uterus,with two subendometrial cysts (? Adenomyosis) (#1) 5 x 3 x 5.7 mm,(#2) 5.4 x 4 x 4.7 mm,EEC 12.3 mm, endometrium contains a 14 x 8 x 6.707mm echogenic mass with a single stalk of color flow (?polyp),normal ov's bilat,no free fluid,no pain during ultrasound.

## 2016-06-23 ENCOUNTER — Telehealth: Payer: Self-pay | Admitting: Adult Health

## 2016-06-23 NOTE — Telephone Encounter (Signed)
Pt aware US showed endometrial polyp, make appt with Dr Emelda FearFerguson to discuss options

## 2016-06-30 ENCOUNTER — Other Ambulatory Visit: Payer: Self-pay | Admitting: Nurse Practitioner

## 2016-07-02 ENCOUNTER — Encounter: Payer: Self-pay | Admitting: Obstetrics and Gynecology

## 2016-07-02 ENCOUNTER — Ambulatory Visit (INDEPENDENT_AMBULATORY_CARE_PROVIDER_SITE_OTHER): Payer: BLUE CROSS/BLUE SHIELD | Admitting: Obstetrics and Gynecology

## 2016-07-02 VITALS — BP 102/80 | HR 80 | Wt 177.0 lb

## 2016-07-02 DIAGNOSIS — N840 Polyp of corpus uteri: Secondary | ICD-10-CM | POA: Diagnosis not present

## 2016-07-02 DIAGNOSIS — N920 Excessive and frequent menstruation with regular cycle: Secondary | ICD-10-CM | POA: Diagnosis not present

## 2016-07-02 DIAGNOSIS — R1030 Lower abdominal pain, unspecified: Secondary | ICD-10-CM | POA: Diagnosis not present

## 2016-07-02 NOTE — Progress Notes (Addendum)
[facility] Visit  07/02/16           Patient name: Michelle Sellers MRN [id number]  Date of birth: 01/26/1971  CC & HPI:  Michelle Sellers is a 45 y.o. female presenting today for discussion of symptoms including lower abdominal pain, menorrhagia, hot flashes, and abdominal bloating. She states some months have bleeding which sometimes requires changing pads every 15-20 min. She was seen by Michelle Sellers on 06/17/16 for pap smear and evaluation of these symptoms. She reports family history of CA.   LNMP October 7th  ROS:  ROS +abdominal bloating +menorrhagia  +hot flashes  Pertinent History Reviewed:   Reviewed: Significant for menorrhagia, peri-menopausal  Medical         Past Medical History:  Diagnosis Date  . Allergy   . Chronic kidney disease    kidney stones  . Menorrhagia with regular cycle 08/06/2014  . Peri-menopause 08/06/2014                              Surgical Hx:   History reviewed. No pertinent surgical history. Medications: Reviewed & Updated - see associated section                       Current Outpatient Prescriptions:  .  citalopram (CELEXA) 40 MG tablet, TAKE 1 TABLET (40 MG TOTAL) BY MOUTH DAILY., Disp: 30 tablet, Rfl: 2 .  fexofenadine (ALLEGRA) 180 MG tablet, Take 180 mg by mouth daily., Disp: , Rfl:  .  fluticasone (FLONASE) 50 MCG/ACT nasal spray, Place 2 sprays into both nostrils daily., Disp: 16 g, Rfl: 6 .  hydrochlorothiazide (HYDRODIURIL) 25 MG tablet, Take 1 tablet (25 mg total) by mouth daily., Disp: 90 tablet, Rfl: 1 .  meclizine (ANTIVERT) 12.5 MG tablet, Take 1 tablet (12.5 mg total) by mouth 3 (three) times daily as needed for dizziness. (Patient not taking: Reported on 07/02/2016), Disp: 30 tablet, Rfl: 0   Social History: Reviewed -  reports that she has never smoked. She has never used smokeless tobacco.  Objective Findings:  Vitals: Blood pressure 102/80, pulse 80, weight 177 lb (80.3 kg), last menstrual period  06/13/2016.  Physical Examination: General appearance - alert, well appearing, and in no distress Mental status - alert, oriented to person, place, and time Pelvic - examination not indicated, discussion only   Lengthy discuDiscussion: 1. Discussed with pt the risks and benefits of endometrial ablation.with 90% satisfactory outcome   2. Discussed risks and benefits of bilateral salpingectomy as permanent sterilization with the ptatient is not inclined to proceed with salpingectomy  At end of discussion, pt had opportunity to ask questions and has no further questions at this time.   Specific discussion of endometrial ablation as noted above. Greater than 50% was spent in counseling and coordination of care with the patient.   Total time greater than: 25 minutes.   Assessment & Plan:   A:  1. Menorrhagia w and endometrial polyp 2. Patient not interested in permanent sterilization at this time  P:  1. Plans are for the patient to proceed with hysteroscopy D&C endometrial ablationin addition to rremoval endometrial polyp Patient information given to Michelle Sellers for scheduling of surgery after 2 weeks. Patient will return in 2 weeks for physical exam to meet preop requiremints   By signing my name below, I, Sonum Patel, attest that this documentation has been prepared under the direction and  in the presence of [name], MD. Electronically Signed: Sonum Patel, Neurosurgeon. 07/02/16. 2:42 PM.  I personally performed the services described in this documentation, which was SCRIBED in my presence. The recorded information has been reviewed and considered accurate. It has been edited as necessary during review. [name], MD

## 2016-07-05 DIAGNOSIS — L0291 Cutaneous abscess, unspecified: Secondary | ICD-10-CM | POA: Diagnosis not present

## 2016-07-05 DIAGNOSIS — J340 Abscess, furuncle and carbuncle of nose: Secondary | ICD-10-CM | POA: Diagnosis not present

## 2016-07-14 NOTE — Patient Instructions (Addendum)
Michelle Sellers  07/14/2016     @PREFPERIOPPHARMACY @   Your procedure is scheduled on  07/21/2016   Report to [facility] at  615  A.M.  Call this number if you have problems the morning of surgery:  [phone]   Remember:  Do not eat food or drink liquids after midnight.  Take these medicines the morning of surgery with A SIP OF WATER   Allegra, antivert, hydrodiuril.  Do not wear jewelry, make-up or nail polish.  Do not wear lotions, powders, or perfumes, or deoderant.  Do not shave 48 hours prior to surgery.  Men may shave face and neck.  Do not bring valuables to the hospital.  [facility] is not responsible for any belongings or valuables.  Contacts, dentures or bridgework may not be worn into surgery.  Leave your suitcase in the car.  After surgery it may be brought to your room.  For patients admitted to the hospital, discharge time will be determined by your treatment team.  Patients discharged the day of surgery will not be allowed to drive home.   Name and phone number of your driver:   family Special instructions:  none  Please read over the following fact sheets that you were given. Anesthesia Post-op Instructions and Care and Recovery After Surgery       Endometrial Ablation Endometrial ablation removes the lining of the uterus (endometrium). It is usually a same-day, outpatient treatment. Ablation helps avoid major surgery, such as surgery to remove the cervix and uterus (hysterectomy). After endometrial ablation, you will have little or no menstrual bleeding and may not be able to have children. However, if you are premenopausal, you will need to use a reliable method of birth control following the procedure because of the small chance that pregnancy can occur. There are different reasons to have this procedure. These reasons include:  Heavy periods.  Bleeding that is causing anemia.  Irregular bleeding.  Bleeding fibroids on  the lining inside the uterus if they are smaller than 3 centimeters. This procedure may not be possible for you if:   You want to have children in the future.   You have severe cramps with your menstrual period.   You have precancerous or cancerous cells in your uterus.   You were recently pregnant.   You have gone through menopause.   You have had major surgery on your uterus, resulting in thinning of the uterine wall. Surgeries may include:  The removal of one or more uterine fibroids (myomectomy).  A cesarean section with a classic (vertical) incision on your uterus. Ask your health care provider what type of cesarean you had. Sometimes the scar on your skin is different than the scar on your uterus. Even if you have had surgery on your uterus, certain types of ablation may still be safe for you. Talk with your health care provider. LET [facility] CARE PROVIDER KNOW ABOUT:  Any allergies you have.  All medicines you are taking, including vitamins, herbs, eye drops, creams, and over-the-counter medicines.  Previous problems you or members of your family have had with the use of anesthetics.  Any blood disorders you have.  Previous surgeries you have had.  Medical conditions you have. RISKS AND COMPLICATIONS  Generally, this is a safe procedure. However, as with any procedure, complications can occur. Possible complications include:  Perforation of the uterus.  Bleeding.  Infection of the  uterus, bladder, or vagina.  Injury to surrounding organs.  An air bubble to the lung (air embolus).  Pregnancy following the procedure.  Failure of the procedure to help the problem, requiring hysterectomy.  Decreased ability to diagnose cancer in the lining of the uterus. BEFORE THE PROCEDURE  The lining of the uterus must be tested to make sure there is no pre-cancerous or cancer cells present.  An ultrasound may be performed to look at the size of the uterus and to  check for abnormalities.  Medicines may be given to thin the lining of the uterus. PROCEDURE  During the procedure, your health care provider will use a tool called a resectoscope to help see inside your uterus. There are different ways to remove the lining of your uterus.   Radiofrequency - This method uses a radiofrequency-alternating electric current to remove the lining of the uterus.  Cryotherapy - This method uses extreme cold to freeze the lining of the uterus.  Heated-Free Liquid - This method uses heated salt (saline) solution to remove the lining of the uterus.  Microwave - This method uses high-energy microwaves to heat up the lining of the uterus to remove it.  Thermal balloon - This method involves inserting a catheter with a balloon tip into the uterus. The balloon tip is filled with heated fluid to remove the lining of the uterus. AFTER THE PROCEDURE  After your procedure, do not have sexual intercourse or insert anything into your vagina until permitted by your health care provider. After the procedure, you may experience:  Cramps.  Vaginal discharge.  Frequent urination.   This information is not intended to replace advice given to you by your health care provider. Make sure you discuss any questions you have with your health care provider.   Document Released: 07/03/2004 Document Revised: 05/15/2015 Document Reviewed: 01/25/2013 Elsevier Interactive Patient Education [employer] Hysteroscopy Hysteroscopy is a procedure used for looking inside the womb (uterus). It may be done for various reasons, including:  To evaluate abnormal bleeding, fibroid (benign, noncancerous) tumors, polyps, scar tissue (adhesions), and possibly cancer of the uterus.  To look for lumps (tumors) and other uterine growths.  To look for causes of why a woman cannot get pregnant (infertility), causes of recurrent loss of pregnancy (miscarriages), or a lost intrauterine device  (IUD).  To perform a sterilization by blocking the fallopian tubes from inside the uterus. In this procedure, a thin, flexible tube with a tiny light and camera on the end of it (hysteroscope) is used to look inside the uterus. A hysteroscopy should be done right after a menstrual period to be sure you are not pregnant. LET [facility] PROVIDER KNOW ABOUT:   Any allergies you have.  All medicines you are taking, including vitamins, herbs, eye drops, creams, and over-the-counter medicines.  Previous problems you or members of your family have had with the use of anesthetics.  Any blood disorders you have.  Previous surgeries you have had.  Medical conditions you have. RISKS AND COMPLICATIONS  Generally, this is a safe procedure. However, as with any procedure, complications can occur. Possible complications include:  Putting a hole in the uterus.  Excessive bleeding.  Infection.  Damage to the cervix.  Injury to other organs.  Allergic reaction to medicines.  Too much fluid used in the uterus for the procedure. BEFORE THE PROCEDURE   Ask your health care provider about changing or stopping any regular medicines.  Do not take aspirin or  blood thinners for 1 week before the procedure, or as directed by your health care provider. These can cause bleeding.  If you smoke, do not smoke for 2 weeks before the procedure.  In some cases, a medicine is placed in the cervix the day before the procedure. This medicine makes the cervix have a larger opening (dilate). This makes it easier for the instrument to be inserted into the uterus during the procedure.  Do not eat or drink anything for at least 8 hours before the surgery.  Arrange for someone to take you home after the procedure. PROCEDURE   You may be given a medicine to relax you (sedative). You may also be given one of the following:  A medicine that numbs the area around the cervix (local anesthetic).  A medicine  that makes you sleep through the procedure (general anesthetic).  The hysteroscope is inserted through the vagina into the uterus. The camera on the hysteroscope sends a picture to a TV screen. This gives the surgeon a good view inside the uterus.  During the procedure, air or a liquid is put into the uterus, which allows the surgeon to see better.  Sometimes, tissue is gently scraped from inside the uterus. These tissue samples are sent to a lab for testing. AFTER THE PROCEDURE   If you had a general anesthetic, you may be groggy for a couple hours after the procedure.  If you had a local anesthetic, you will be able to go home as soon as you are stable and feel ready.  You may have some cramping. This normally lasts for a couple days.  You may have bleeding, which varies from light spotting for a few days to menstrual-like bleeding for 3-7 days. This is normal.  If your test results are not back during the visit, make an appointment with your health care provider to find out the results.   This information is not intended to replace advice given to you by your health care provider. Make sure you discuss any questions you have with your health care provider.   Document Released: 11/30/2000 Document Revised: 06/14/2013 Document Reviewed: 03/23/2013 Elsevier Interactive Patient Education 2016 Elsevier Inc. Hysteroscopy, Care After Refer to this sheet in the next few weeks. These instructions provide you with information on caring for yourself after your procedure. Your health care provider may also give you more specific instructions. Your treatment has been planned according to current medical practices, but problems sometimes occur. Call your health care provider if you have any problems or questions after your procedure.  WHAT TO EXPECT AFTER THE PROCEDURE After your procedure, it is typical to have the following:  You may have some cramping. This normally lasts for a couple  days.  You may have bleeding. This can vary from light spotting for a few days to menstrual-like bleeding for 3-7 days. HOME CARE INSTRUCTIONS  Rest for the first 1-2 days after the procedure.  Only take over-the-counter or prescription medicines as directed by your health care provider. Do not take aspirin. It can increase the chances of bleeding.  Take showers instead of baths for 2 weeks or as directed by your health care provider.  Do not drive for 24 hours or as directed.  Do not drink alcohol while taking pain medicine.  Do not use tampons, douche, or have sexual intercourse for 2 weeks or until your health care provider says it is okay.  Take your temperature twice a day for 4-5 days. Write  it down each time.  Follow your health care provider's advice about diet, exercise, and lifting.  If you develop constipation, you may:  Take a mild laxative if your health care provider approves.  Add bran foods to your diet.  Drink enough fluids to keep your urine clear or pale yellow.  Try to have someone with you or available to you for the first 24-48 hours, especially if you were given a general anesthetic.  Follow up with your health care provider as directed. SEEK MEDICAL CARE IF:  You feel dizzy or lightheaded.  You feel sick to your stomach (nauseous).  You have abnormal vaginal discharge.  You have a rash.  You have pain that is not controlled with medicine. SEEK IMMEDIATE MEDICAL CARE IF:  You have bleeding that is heavier than a normal menstrual period.  You have a fever.  You have increasing cramps or pain, not controlled with medicine.  You have new belly (abdominal) pain.  You pass out.  You have pain in the tops of your shoulders (shoulder strap areas).  You have shortness of breath.   This information is not intended to replace advice given to you by your health care provider. Make sure you discuss any questions you have with your health care  provider.   Document Released: 06/14/2013 Document Reviewed: 06/14/2013 Elsevier Interactive Patient Education 2016 Elsevier Inc. Dilation and Curettage or Vacuum Curettage Dilation and curettage (D&C) and vacuum curettage are minor procedures. A D&C involves stretching (dilation) the cervix and scraping (curettage) the inside lining of the womb (uterus). During a D&C, tissue is gently scraped from the inside lining of the uterus. During a vacuum curettage, the lining and tissue in the uterus are removed with the use of gentle suction.  Curettage may be performed to either diagnose or treat a problem. As a diagnostic procedure, curettage is performed to examine tissues from the uterus. A diagnostic curettage may be performed for the following symptoms:   Irregular bleeding in the uterus.   Bleeding with the development of clots.   Spotting between menstrual periods.   Prolonged menstrual periods.   Bleeding after menopause.   No menstrual period (amenorrhea).   A change in size and shape of the uterus.  As a treatment procedure, curettage may be performed for the following reasons:   Removal of an IUD (intrauterine device).   Removal of retained placenta after giving birth. Retained placenta can cause an infection or bleeding severe enough to require transfusions.   Abortion.   Miscarriage.   Removal of polyps inside the uterus.   Removal of uncommon types of noncancerous lumps (fibroids).  [facility] PROVID[facility] KNOW ABOUT:   Any allergies you have.   All medicines you are taking, including vitamins, herbs, eye drops, creams, and over-the-counter medicines.   Previous problems you or members of your family have had with the use of anesthetics.   Any blood disorders you have.   Previous surgeries you have had.   Medical conditions you have. RISKS AND COMPLICATIONS  Generally, this is a safe procedure. However, as with any procedure,  complications can occur. Possible complications include:  Excessive bleeding.   Infection of the uterus.   Damage to the cervix.   Development of scar tissue (adhesions) inside the uterus, later causing abnormal amounts of menstrual bleeding.   Complications from the general anesthetic, if a general anesthetic is used.   Putting a hole (perforation) in the uterus. This is rare.  BEFORE  THE PROCEDURE   Eat and drink before the procedure only as directed by your health care provider.   Arrange for someone to take you home.  PROCEDURE  This procedure usually takes about 15-30 minutes.  You will be given one of the following:  A medicine that numbs the area in and around the cervix (local anesthetic).   A medicine to make you sleep through the procedure (general anesthetic).  You will lie on your back with your legs in stirrups.   A warm metal or plastic instrument (speculum) will be placed in your vagina to keep it open and to allow the health care provider to see the cervix.  There are two ways in which your cervix can be softened and dilated. These include:   Taking a medicine.   Having thin rods (laminaria) inserted into your cervix.   A curved tool (curette) will be used to scrape cells from the inside lining of the uterus. In some cases, gentle suction is applied with the curette. The curette will then be removed.  AFTER THE PROCEDURE   You will rest in the recovery area until you are stable and are ready to go home.   You may feel sick to your stomach (nauseous) or throw up (vomit) if you were given a general anesthetic.   You may have a sore throat if a tube was placed in your throat during general anesthesia.   You may have light cramping and bleeding. This may last for 2 days to 2 weeks after the procedure.   Your uterus needs to make a new lining after the procedure. This may make your next period late.   This information is not intended to  replace advice given to you by your health care provider. Make sure you discuss any questions you have with your health care provider.   Document Released: 08/24/2005 Document Revised: 04/26/2013 Document Reviewed: 03/23/2013 Elsevier Interactive Patient Education 2016 Elsevier Inc.  Dilation and Curettage or Vacuum Curettage, Care After These instructions give you information on caring for yourself after your procedure. Your doctor may also give you more specific instructions. Call your doctor if you have any problems or questions after your procedure. HOME CARE  Do not drive for 24 hours.  Wait 1 week before doing any activities that wear you out.  Take your temperature 2 times a day for 4 days. Write it down. Tell your doctor if you have a fever.  Do not stand for a long time.  Do not lift, push, or pull anything over 10 pounds (4.5 kilograms).  Limit stair climbing to once or twice a day.  Rest often.  Continue with your usual diet.  Drink enough fluids to keep your pee (urine) clear or pale yellow.  If you have a hard time pooping (constipation), you may:  Take a medicine to help you go poop (laxative) as told by your doctor.  Eat more fruit and bran.  Drink more fluids.  Take showers, not baths, for as long as told by your doctor.  Do not swim or use a hot tub until your doctor says it is okay.  Have someone with you for 1-2 days after the procedure.  Do not douche, use tampons, or have sex (intercourse) for 2 weeks.  Only take medicines as told by your doctor. Do not take aspirin. It can cause bleeding.  Keep all doctor visits. GET HELP IF:  You have cramps or pain not helped by medicine.  You  have new pain in the belly (abdomen).  You have a bad smelling fluid coming from your vagina.  You have a rash.  You have problems with any medicine. GET HELP RIGHT AWAY IF:   You start to bleed more than a regular period.  You have a fever.  You have chest  pain.  You have trouble breathing.  You feel dizzy or feel like passing out (fainting).  You pass out.  You have pain in the tops of your shoulders.  You have vaginal bleeding with or without clumps of blood (blood clots). MAKE SURE YOU:  Understand these instructions.  Will watch your condition.  Will get help right away if you are not doing well or get worse.   This information is not intended to replace advice given to you by your health care provider. Make sure you discuss any questions you have with your health care provider.   Document Released: 06/02/2008 Document Revised: 08/29/2013 Document Reviewed: 03/23/2013 Elsevier Interactive Patient Education 2016 [employer] Inc. PATIENT INSTRUCTIONS POST-ANESTHESIA  IMMEDIATELY FOLLOWING SURGERY:  Do not drive or operate machinery for the first twenty four hours after surgery.  Do not make any important decisions for twenty four hours after surgery or while taking narcotic pain medications or sedatives.  If you develop intractable nausea and vomiting or a severe headache please notify your doctor immediately.  FOLLOW-UP:  Please make an appointment with your surgeon as instructed. You do not need to follow up with anesthesia unless specifically instructed to do so.  WOUND CARE INSTRUCTIONS (if applicable):  Keep a dry clean dressing on the anesthesia/puncture wound site if there is drainage.  Once the wound has quit draining you may leave it open to air.  Generally you should leave the bandage intact for twenty four hours unless there is drainage.  If the epidural site drains for more than 36-48 hours please call the anesthesia department.  QUESTIONS?:  Please feel free to call your physician or the hospital operator if you have any questions, and they will be happy to assist you.

## 2016-07-15 ENCOUNTER — Encounter: Payer: Self-pay | Admitting: Obstetrics and Gynecology

## 2016-07-15 ENCOUNTER — Other Ambulatory Visit: Payer: Self-pay | Admitting: Obstetrics and Gynecology

## 2016-07-15 ENCOUNTER — Ambulatory Visit (INDEPENDENT_AMBULATORY_CARE_PROVIDER_SITE_OTHER): Payer: BLUE CROSS/BLUE SHIELD | Admitting: Obstetrics and Gynecology

## 2016-07-15 VITALS — BP 104/72 | HR 72 | Wt 178.4 lb

## 2016-07-15 DIAGNOSIS — N840 Polyp of corpus uteri: Secondary | ICD-10-CM | POA: Diagnosis not present

## 2016-07-15 DIAGNOSIS — Z1159 Encounter for screening for other viral diseases: Secondary | ICD-10-CM

## 2016-07-15 DIAGNOSIS — N938 Other specified abnormal uterine and vaginal bleeding: Secondary | ICD-10-CM

## 2016-07-15 DIAGNOSIS — Z118 Encounter for screening for other infectious and parasitic diseases: Secondary | ICD-10-CM | POA: Diagnosis not present

## 2016-07-15 NOTE — Progress Notes (Signed)
Patient ID: Michelle Sellers, female   DOB: 1970/11/03, 45 y.o.   MRN: [id number]  Preoperative History and Physical  Michelle Sellers is a 45 y.o. G2P2 here for surgical management of menorrhagia with endometrial polyp.   ROS  She complains of sharp LLQ pain that began recently, and is exacerbated with prolonged periods of activity. She denies pain radiation to the leg, but does note pain radiation to the groin. She states her pain is not similar to prior pain with renal calculi. She states she has not noticed any associated with bowel function. Pt reports her pain is alleviated with ibuprofen.   Proposed surgery: hysteroscopy D&C endometrial ablation in addition to removal endometrial polyp  Past Medical History:  Diagnosis Date  . Allergy   . Chronic kidney disease    kidney stones  . Menorrhagia with regular cycle 08/06/2014  . Peri-menopause 08/06/2014   History reviewed. No pertinent surgical history. OB History  Gravida Para Term Preterm AB Living  2 2       2   SAB TAB Ectopic Multiple Live Births               # Outcome Date GA Lbr Len/2nd Weight Sex Delivery Anes PTL Lv  2 Para           1 Para             Patient denies any other pertinent gynecologic issues.   Current Outpatient Prescriptions on File Prior to Visit  Medication Sig Dispense Refill  . citalopram (CELEXA) 40 MG tablet TAKE 1 TABLET (40 MG TOTAL) BY MOUTH DAILY. 30 tablet 2  . fexofenadine (ALLEGRA) 180 MG tablet Take 180 mg by mouth daily.    . hydrochlorothiazide (HYDRODIURIL) 25 MG tablet Take 1 tablet (25 mg total) by mouth daily. 90 tablet 1  . ibuprofen (ADVIL,MOTRIN) 200 MG tablet Take 400 mg by mouth every 6 (six) hours as needed for mild pain.    . meclizine (ANTIVERT) 12.5 MG tablet Take 1 tablet (12.5 mg total) by mouth 3 (three) times daily as needed for dizziness. (Patient taking differently: Take 12.5 mg by mouth 3 (three) times daily as needed for dizziness. ) 30 tablet 0  .  fluticasone (FLONASE) 50 MCG/ACT nasal spray Place 2 sprays into both nostrils daily. (Patient not taking: Reported on 07/15/2016) 16 g 6   No current facility-administered medications on file prior to visit.    No Known Allergies  Social History:   reports that she has never smoked. She has never used smokeless tobacco. She reports that she drinks about 0.6 oz of alcohol per week . She reports that she does not use drugs.  Family History  Problem Relation Age of Onset  . Hyperlipidemia Mother   . Hypertension Mother   . Cancer Mother     breast  . Heart disease Father   . Hyperlipidemia Father   . Hypertension Father   . Hyperlipidemia Maternal Grandmother   . Hyperlipidemia Maternal Grandfather   . Hyperlipidemia Paternal Grandmother   . Hyperlipidemia Paternal Grandfather     Review of Systems: Intermittent, sharp LLQ pain  PHYSICAL EXAM: Blood pressure 104/72, pulse 72, weight 178 lb 6.4 oz (80.9 kg), last menstrual period 07/07/2016. General appearance - alert, well appearing, and in no distress Chest - clear to auscultation, no wheezes, rales or rhonchi, symmetric air entry Heart - normal rate and regular rhythm Abdomen - soft, nondistended, no masses or organomegaly. LLQ pain reproducible with right leg  raise. Pain not reproducible with palpation.  Pelvic -  VULVA: normal appearing vulva with no masses, tenderness or lesions,  VAGINA: normal appearing vagina with normal color and discharge, no lesions,  CERVIX: normal appearing cervix without discharge or lesions, well supported, multiparous, large and bulbous, UTERUS: uterus is normal size, shape, consistency and nontender, well supported, anteflexed, normal sized. LLQ abdominal pain not reproduced by uterine movement.  ADNEXA: normal adnexa in size, nontender and no masses.  Extremities - peripheral pulses normal, no pedal edema, no clubbing or cyanosis  Labs: No results found for this or any previous visit (from the  past 336 hour(s)).  Imaging Studies:   Assessment: Patient Active Problem List   Diagnosis Date Noted  . Endometrial polyp 07/02/2016  . GAD (generalized anxiety disorder) 11/11/2015  . BMI 29.0-29.9,adult 08/06/2015  . Menorrhagia with regular cycle 08/06/2014  . Peri-menopause 08/06/2014  . Essential hypertension 06/14/2014  . Hyperlipidemia 08/02/2013  . Allergic rhinitis 12/03/2012   Assessment:  UA notable for trace blood LLQ pain likely muscle pain- pt to continue symptomatic therapy with ibuprofen   Plan: Patient will undergo surgical management with hysteroscopy D&C endometrial ablation in addition to removal endometrial polyp.    .mec 07/15/2016 12:41 PM    By signing my name below, I, Doreatha MartinEva Mathews, attest that this documentation has been prepared under the direction and in the presence of Tilda BurrowJohn V Caylen Kuwahara, MD. Electronically Signed: Doreatha MartinEva Mathews, ED Scribe. 07/15/16. 12:36 PM.  I personally performed the services described in this documentation, which was SCRIBED in my presence. The recorded information has been reviewed and considered accurate. It has been edited as necessary during review. Tilda BurrowFERGUSON,Simone Rodenbeck V, MD

## 2016-07-16 ENCOUNTER — Encounter (HOSPITAL_COMMUNITY): Payer: Self-pay

## 2016-07-16 ENCOUNTER — Encounter (HOSPITAL_COMMUNITY)
Admission: RE | Admit: 2016-07-16 | Discharge: 2016-07-16 | Disposition: A | Discharge status: Home / Self Care | Payer: BLUE CROSS/BLUE SHIELD | Source: Ambulatory Visit | Attending: Obstetrics and Gynecology | Admitting: Obstetrics and Gynecology

## 2016-07-16 DIAGNOSIS — I10 Essential (primary) hypertension: Secondary | ICD-10-CM | POA: Diagnosis not present

## 2016-07-16 HISTORY — DX: Anxiety disorder, unspecified: F41.9

## 2016-07-16 LAB — COMPREHENSIVE METABOLIC PANEL
ALBUMIN: 4.2 g/dL (ref 3.5–5.0)
ALT: 12 U/L — ABNORMAL LOW (ref 14–54)
AST: 16 U/L (ref 15–41)
Alkaline Phosphatase: 46 U/L (ref 38–126)
Anion gap: 7 (ref 5–15)
BILIRUBIN TOTAL: 0.4 mg/dL (ref 0.3–1.2)
BUN: 15 mg/dL (ref 6–20)
CO2: 28 mmol/L (ref 22–32)
Calcium: 9.1 mg/dL (ref 8.9–10.3)
Chloride: 99 mmol/L — ABNORMAL LOW (ref 101–111)
Creatinine, Ser: 0.8 mg/dL (ref 0.44–1.00)
GFR calc Af Amer: >60 mL/min (ref >60)
GFR calc non Af Amer: >60 mL/min (ref >60)
GLUCOSE: 87 mg/dL (ref 65–99)
POTASSIUM: 3.5 mmol/L (ref 3.5–5.1)
SODIUM: 134 mmol/L — AB (ref 135–145)
TOTAL PROTEIN: 7.1 g/dL (ref 6.5–8.1)

## 2016-07-16 LAB — URINALYSIS, ROUTINE W REFLEX MICROSCOPIC
BILIRUBIN URINE: NEGATIVE
Glucose, UA: NEGATIVE mg/dL
Ketones, ur: NEGATIVE mg/dL
NITRITE: NEGATIVE
PH: 7 (ref 5.0–8.0)
Protein, ur: NEGATIVE mg/dL
SPECIFIC GRAVITY, URINE: 1.015 (ref 1.005–1.030)

## 2016-07-16 LAB — CBC
HEMATOCRIT: 35.9 % — AB (ref 36.0–46.0)
HEMOGLOBIN: 12.1 g/dL (ref 12.0–15.0)
MCH: 29 pg (ref 26.0–34.0)
MCHC: 33.7 g/dL (ref 30.0–36.0)
MCV: 86.1 fL (ref 78.0–100.0)
Platelets: 251 10*3/uL (ref 150–400)
RBC: 4.17 MIL/uL (ref 3.87–5.11)
RDW: 13.2 % (ref 11.5–15.5)
WBC: 7.6 10*3/uL (ref 4.0–10.5)

## 2016-07-16 LAB — TYPE AND SCREEN
ABO/RH(D): A POS
Antibody Screen: NEGATIVE

## 2016-07-16 LAB — HCG, SERUM, QUALITATIVE: Preg, Serum: NEGATIVE

## 2016-07-16 LAB — URINE MICROSCOPIC-ADD ON

## 2016-07-17 LAB — GC/CHLAMYDIA PROBE AMP
Chlamydia trachomatis, NAA: NEGATIVE
NEISSERIA GONORRHOEAE BY PCR: NEGATIVE

## 2016-07-20 ENCOUNTER — Encounter: Payer: BLUE CROSS/BLUE SHIELD | Admitting: Obstetrics and Gynecology

## 2016-07-20 NOTE — H&P (Signed)
3. Doreatha Martinva Mathews at 07/15/2016 12:24 PM - Shared    Patient ID: Michelle StalkerPalmira Sellers, female   DOB: 1971/04/06, 45 y.o.   MRN: [id number]  Preoperative History and Physical  Michelle Sellers is a 45 y.o. G2P2 here for surgical management of menorrhagia with endometrial polyp.   ROS  She complains of sharp LLQ pain that began recently, and is exacerbated with prolonged periods of activity. She denies pain radiation to the leg, but does note pain radiation to the groin. She states her pain is not similar to prior pain with renal calculi. She states she has not noticed any associated with bowel function. Pt reports her pain is alleviated with ibuprofen.   Proposed surgery: hysteroscopy D&C endometrial ablation in addition to removal endometrial polyp      Past Medical History:  Diagnosis Date  . Allergy   . Chronic kidney disease    kidney stones  . Menorrhagia with regular cycle 08/06/2014  . Peri-menopause 08/06/2014   History reviewed. No pertinent surgical history.                 OB History  Gravida Para Term Preterm AB Living  2 2       2   SAB TAB Ectopic Multiple Live Births               # Outcome Date GA Lbr Len/2nd Weight Sex Delivery Anes PTL Lv  2 Para           1 Para             Patient denies any other pertinent gynecologic issues.         Current Outpatient Prescriptions on File Prior to Visit  Medication Sig Dispense Refill  . citalopram (CELEXA) 40 MG tablet TAKE 1 TABLET (40 MG TOTAL) BY MOUTH DAILY. 30 tablet 2  . fexofenadine (ALLEGRA) 180 MG tablet Take 180 mg by mouth daily.    . hydrochlorothiazide (HYDRODIURIL) 25 MG tablet Take 1 tablet (25 mg total) by mouth daily. 90 tablet 1  . ibuprofen (ADVIL,MOTRIN) 200 MG tablet Take 400 mg by mouth every 6 (six) hours as needed for mild pain.    . meclizine (ANTIVERT) 12.5 MG tablet Take 1 tablet (12.5 mg total) by mouth 3 (three) times daily as needed for dizziness. (Patient  taking differently: Take 12.5 mg by mouth 3 (three) times daily as needed for dizziness. ) 30 tablet 0  . fluticasone (FLONASE) 50 MCG/ACT nasal spray Place 2 sprays into both nostrils daily. (Patient not taking: Reported on 07/15/2016) 16 g 6   No current facility-administered medications on file prior to visit.    No Known Allergies  Social History:   reports that she has never smoked. She has never used smokeless tobacco. She reports that she drinks about 0.6 oz of alcohol per week . She reports that she does not use drugs.        Family History  Problem Relation Age of Onset  . Hyperlipidemia Mother   . Hypertension Mother   . Cancer Mother     breast  . Heart disease Father   . Hyperlipidemia Father   . Hypertension Father   . Hyperlipidemia Maternal Grandmother   . Hyperlipidemia Maternal Grandfather   . Hyperlipidemia Paternal Grandmother   . Hyperlipidemia Paternal Grandfather     Review of Systems: Intermittent, sharp LLQ pain  PHYSICAL EXAM: Blood pressure 104/72, pulse 72, weight 178 lb 6.4 oz (80.9 kg), last menstrual period 07/07/2016.  General appearance - alert, well appearing, and in no distress Chest - clear to auscultation, no wheezes, rales or rhonchi, symmetric air entry Heart - normal rate and regular rhythm Abdomen - soft, nondistended, no masses or organomegaly. LLQ pain reproducible with right leg raise. Pain not reproducible with palpation.  Pelvic -  VULVA: normal appearing vulva with no masses, tenderness or lesions,  VAGINA: normal appearing vagina with normal color and discharge, no lesions,  CERVIX: normal appearing cervix without discharge or lesions, well supported, multiparous, large and bulbous, UTERUS: uterus is normal size, shape, consistency and nontender, well supported, anteflexed, normal sized. LLQ abdominal pain not reproduced by uterine movement.  ADNEXA: normal adnexa in size, nontender and no masses.  Extremities -  peripheral pulses normal, no pedal edema, no clubbing or cyanosis  Labs: No results found for this or any previous visit (from the past 336 hour(s)).  Imaging Studies:   Assessment:     Patient Active Problem List   Diagnosis Date Noted  . Endometrial polyp 07/02/2016  . GAD (generalized anxiety disorder) 11/11/2015  . BMI 29.0-29.9,adult 08/06/2015  . Menorrhagia with regular cycle 08/06/2014  . Peri-menopause 08/06/2014  . Essential hypertension 06/14/2014  . Hyperlipidemia 08/02/2013  . Allergic rhinitis 12/03/2012   Assessment:  UA notable for trace blood LLQ pain likely muscle pain- pt to continue symptomatic therapy with ibuprofen   Plan: Patient will undergo surgical management with hysteroscopy D&C endometrial ablation in addition to removal endometrial polyp.    .mec 07/15/2016 12:41 PM    By signing my name below, I, Doreatha MartinEva Mathews, attest that this documentation has been prepared under the direction and in the presence of Tilda BurrowJohn V Maven Varelas, MD. Electronically Signed: Doreatha MartinEva Mathews, ED Scribe. 07/15/16. 12:36 PM.  I personally performed the services described in this documentation, which was SCRIBED in my presence. The recorded information has been reviewed and considered accurate. It has been edited as necessary during review. Tilda BurrowFERGUSON,Hanae Waiters V, MD

## 2016-07-21 ENCOUNTER — Ambulatory Visit (HOSPITAL_COMMUNITY): Payer: BLUE CROSS/BLUE SHIELD | Admitting: Anesthesiology

## 2016-07-21 ENCOUNTER — Encounter (HOSPITAL_COMMUNITY): Payer: Self-pay | Admitting: *Deleted

## 2016-07-21 ENCOUNTER — Encounter (HOSPITAL_COMMUNITY)
Admission: RE | Disposition: A | Discharge status: Home / Self Care | Payer: Self-pay | Source: Ambulatory Visit | Attending: Obstetrics and Gynecology

## 2016-07-21 ENCOUNTER — Ambulatory Visit (HOSPITAL_COMMUNITY)
Admission: RE | Admit: 2016-07-21 | Discharge: 2016-07-21 | Disposition: A | Discharge status: Home / Self Care | Payer: BLUE CROSS/BLUE SHIELD | Source: Ambulatory Visit | Attending: Obstetrics and Gynecology | Admitting: Obstetrics and Gynecology

## 2016-07-21 DIAGNOSIS — N189 Chronic kidney disease, unspecified: Secondary | ICD-10-CM | POA: Insufficient documentation

## 2016-07-21 DIAGNOSIS — J309 Allergic rhinitis, unspecified: Secondary | ICD-10-CM | POA: Insufficient documentation

## 2016-07-21 DIAGNOSIS — N840 Polyp of corpus uteri: Secondary | ICD-10-CM | POA: Insufficient documentation

## 2016-07-21 DIAGNOSIS — N920 Excessive and frequent menstruation with regular cycle: Secondary | ICD-10-CM | POA: Diagnosis not present

## 2016-07-21 DIAGNOSIS — Z79899 Other long term (current) drug therapy: Secondary | ICD-10-CM | POA: Insufficient documentation

## 2016-07-21 DIAGNOSIS — I129 Hypertensive chronic kidney disease with stage 1 through stage 4 chronic kidney disease, or unspecified chronic kidney disease: Secondary | ICD-10-CM | POA: Insufficient documentation

## 2016-07-21 DIAGNOSIS — N921 Excessive and frequent menstruation with irregular cycle: Secondary | ICD-10-CM | POA: Diagnosis not present

## 2016-07-21 HISTORY — PX: POLYPECTOMY: SHX5525

## 2016-07-21 HISTORY — PX: DILITATION & CURRETTAGE/HYSTROSCOPY WITH NOVASURE ABLATION: SHX5568

## 2016-07-21 SURGERY — DILATATION & CURETTAGE/HYSTEROSCOPY WITH NOVASURE ABLATION
Anesthesia: General

## 2016-07-21 MED ORDER — ONDANSETRON HCL 4 MG/2ML IJ SOLN
4.0000 mg | Freq: Once | INTRAMUSCULAR | Status: AC
  Administered 2016-07-21: 4 mg via INTRAVENOUS

## 2016-07-21 MED ORDER — FENTANYL CITRATE (PF) 100 MCG/2ML IJ SOLN
25.0000 ug | INTRAMUSCULAR | Status: DC | PRN
  Administered 2016-07-21: 25 ug via INTRAVENOUS

## 2016-07-21 MED ORDER — BUPIVACAINE-EPINEPHRINE (PF) 0.25% -1:200000 IJ SOLN
INTRAMUSCULAR | Status: DC | PRN
  Administered 2016-07-21: 20 mL

## 2016-07-21 MED ORDER — FENTANYL CITRATE (PF) 100 MCG/2ML IJ SOLN
INTRAMUSCULAR | Status: AC
  Filled 2016-07-21: qty 2

## 2016-07-21 MED ORDER — DEXAMETHASONE SODIUM PHOSPHATE 4 MG/ML IJ SOLN
4.0000 mg | INTRAMUSCULAR | Status: AC
  Administered 2016-07-21: 4 mg via INTRAVENOUS

## 2016-07-21 MED ORDER — DEXAMETHASONE SODIUM PHOSPHATE 4 MG/ML IJ SOLN
INTRAMUSCULAR | Status: AC
  Filled 2016-07-21: qty 1

## 2016-07-21 MED ORDER — LIDOCAINE HCL (PF) 1 % IJ SOLN
INTRAMUSCULAR | Status: AC
  Filled 2016-07-21: qty 5

## 2016-07-21 MED ORDER — GLYCOPYRROLATE 0.2 MG/ML IJ SOLN
INTRAMUSCULAR | Status: DC | PRN
  Administered 2016-07-21: 0.2 mg via INTRAVENOUS

## 2016-07-21 MED ORDER — PROPOFOL 10 MG/ML IV BOLUS
INTRAVENOUS | Status: DC | PRN
  Administered 2016-07-21: 150 mg via INTRAVENOUS

## 2016-07-21 MED ORDER — SODIUM CHLORIDE 0.9 % IR SOLN
Status: DC | PRN
  Administered 2016-07-21: 3000 mL

## 2016-07-21 MED ORDER — MIDAZOLAM HCL 2 MG/2ML IJ SOLN
INTRAMUSCULAR | Status: AC
  Filled 2016-07-21: qty 2

## 2016-07-21 MED ORDER — ONDANSETRON HCL 4 MG/2ML IJ SOLN
INTRAMUSCULAR | Status: AC
Start: 2016-07-21 — End: 2016-07-21
  Filled 2016-07-21: qty 2

## 2016-07-21 MED ORDER — KETOROLAC TROMETHAMINE 10 MG PO TABS: 10.0000 mg | ORAL_TABLET | Freq: Four times a day (QID) | ORAL | 0 refills | Status: DC | PRN

## 2016-07-21 MED ORDER — LIDOCAINE HCL (CARDIAC) 10 MG/ML IV SOLN
INTRAVENOUS | Status: DC | PRN
  Administered 2016-07-21: 50 mg via INTRAVENOUS

## 2016-07-21 MED ORDER — LACTATED RINGERS IV SOLN
INTRAVENOUS | Status: DC
  Administered 2016-07-21: 07:00:00 via INTRAVENOUS

## 2016-07-21 MED ORDER — MIDAZOLAM HCL 2 MG/2ML IJ SOLN
1.0000 mg | INTRAMUSCULAR | Status: DC | PRN
  Administered 2016-07-21: 2 mg via INTRAVENOUS

## 2016-07-21 MED ORDER — FENTANYL CITRATE (PF) 100 MCG/2ML IJ SOLN: 25.0000 ug | INTRAMUSCULAR | Status: DC | PRN

## 2016-07-21 MED ORDER — PROPOFOL 10 MG/ML IV BOLUS
INTRAVENOUS | Status: AC
  Filled 2016-07-21: qty 20

## 2016-07-21 MED ORDER — FENTANYL CITRATE (PF) 100 MCG/2ML IJ SOLN
INTRAMUSCULAR | Status: DC | PRN
  Administered 2016-07-21 (×4): 25 ug via INTRAVENOUS

## 2016-07-21 MED ORDER — BUPIVACAINE-EPINEPHRINE (PF) 0.25% -1:200000 IJ SOLN
INTRAMUSCULAR | Status: AC
  Filled 2016-07-21: qty 30

## 2016-07-21 SURGICAL SUPPLY — 26 items
ABLATOR ENDOMETRIAL BIPOLAR (ABLATOR) ×2 IMPLANT
BAG HAMPER (MISCELLANEOUS) ×2 IMPLANT
CLOTH BEACON ORANGE TIMEOUT ST (SAFETY) ×2 IMPLANT
COVER LIGHT HANDLE STERIS (MISCELLANEOUS) ×4 IMPLANT
DECANTER SPIKE VIAL GLASS SM (MISCELLANEOUS) ×2 IMPLANT
FORMALIN 10 PREFIL 120ML (MISCELLANEOUS) ×2 IMPLANT
GLOVE BIOGEL PI IND STRL 7.0 (GLOVE) ×1 IMPLANT
GLOVE BIOGEL PI IND STRL 9 (GLOVE) ×1 IMPLANT
GLOVE BIOGEL PI INDICATOR 7.0 (GLOVE) ×1
GLOVE BIOGEL PI INDICATOR 9 (GLOVE) ×1
GLOVE ECLIPSE 6.5 STRL STRAW (GLOVE) ×2 IMPLANT
GLOVE ECLIPSE 9.0 STRL (GLOVE) ×2 IMPLANT
GOWN SPEC L3 XXLG W/TWL (GOWN DISPOSABLE) ×2 IMPLANT
GOWN STRL REUS W/TWL LRG LVL3 (GOWN DISPOSABLE) ×2 IMPLANT
INST SET HYSTEROSCOPY (KITS) ×2 IMPLANT
IV NS IRRIG 3000ML ARTHROMATIC (IV SOLUTION) ×2 IMPLANT
KIT ROOM TURNOVER AP CYSTO (KITS) ×2 IMPLANT
KIT ROOM TURNOVER APOR (KITS) ×2 IMPLANT
MANIFOLD NEPTUNE II (INSTRUMENTS) ×2 IMPLANT
PACK PERI GYN (CUSTOM PROCEDURE TRAY) ×2 IMPLANT
PAD ARMBOARD 7.5X6 YLW CONV (MISCELLANEOUS) ×2 IMPLANT
PAD TELFA 3X4 1S STER (GAUZE/BANDAGES/DRESSINGS) ×2 IMPLANT
SET BASIN LINEN APH (SET/KITS/TRAYS/PACK) ×2 IMPLANT
SET IRRIG Y TYPE TUR BLADDER L (SET/KITS/TRAYS/PACK) ×2 IMPLANT
SUT PROLENE 2 0 FS (SUTURE) IMPLANT
SYR CONTROL 10ML LL (SYRINGE) ×2 IMPLANT

## 2016-07-21 NOTE — Transfer of Care (Signed)
Immediate Anesthesia Transfer of Care Note  Patient: Michelle Sellers  Procedure(s) Performed: Procedure(s): DILATATION & CURETTAGE/HYSTEROSCOPY WITH NOVASURE ENDOMETRIAL ABLATION (N/A) REMOVAL OF ENDOMETRIAL POLYP (N/A)  Patient Location: PACU  Anesthesia Type:General  Level of Consciousness: awake, alert , patient cooperative and responds to stimulation  Airway & Oxygen Therapy: Patient Spontanous Breathing and Patient connected to nasal cannula oxygen  Post-op Assessment: Report given to RN and Post -op Vital signs reviewed and stable  Post vital signs: Reviewed and stable  Last Vitals:  Vitals:   07/21/16 0705 07/21/16 0710  BP:  126/83  Resp: (!) 23 18  Temp:      Last Pain: There were no vitals filed for this visit.    Patients Stated Pain Goal: 5 (07/21/16 [id number])  Complications: No apparent anesthesia complications

## 2016-07-21 NOTE — Progress Notes (Signed)
Dr Emelda FearFerguson to call in Toradol to her pharmacy.  Patient notified.

## 2016-07-21 NOTE — Anesthesia Procedure Notes (Signed)
Procedure Name: Intubation Date/Time: 07/21/2016 7:32 AM Performed by: Patrcia DollyMOSES, Anwyn Kriegel Pre-anesthesia Checklist: Patient identified, Patient being monitored, Timeout performed, Emergency Drugs available and Suction available Patient Re-evaluated:Patient Re-evaluated prior to inductionOxygen Delivery Method: Circle system utilized Preoxygenation: Pre-oxygenation with 100% oxygen Intubation Type: IV induction Ventilation: Mask ventilation without difficulty Placement Confirmation: positive ETCO2 and breath sounds checked- equal and bilateral Tube secured with: Tape Dental Injury: Teeth and Oropharynx as per pre-operative assessment

## 2016-07-21 NOTE — Brief Op Note (Signed)
07/21/2016  10:54 AM  PATIENT:  Michelle Sellers  45 y.o. female  PRE-OPERATIVE DIAGNOSIS:  menorrhagia endometrial polyp  POST-OPERATIVE DIAGNOSIS:  menorrhagia endometrial polyp  PROCEDURE:  Procedure(s): DILATATION & CURETTAGE/HYSTEROSCOPY WITH NOVASURE ENDOMETRIAL ABLATION (N/A) REMOVAL OF ENDOMETRIAL POLYP (N/A)  SURGEON:  Surgeon(s) and Role:    * Tilda BurrowJohn V Baylee Campus, MD - Primary  PHYSICIAN ASSISTANT:   ASSISTANTS: Marya LandryMaggie Henderson CST   ANESTHESIA:   paracervical block and General with LMA  EBL:  Total I/O In: 700 [I.V.:700] Out: 10 [Blood:10]  BLOOD ADMINISTERED:none  DRAINS: none   LOCAL MEDICATIONS USED:  MARCAINE    and Amount: 20 ml  SPECIMEN:  Source of Specimen:  Endometrial polyp, endometrial curettings  DISPOSITION OF SPECIMEN:  PATHOLOGY  COUNTS:  YES  TOURNIQUET:  * No tourniquets in log *  DICTATION: .Dragon Dictation  PLAN OF CARE: Discharge to home after PACU  PATIENT DISPOSITION:  PACU - hemodynamically stable.   Delay start of Pharmacological VTE agent (>24hrs) due to surgical blood loss or risk of bleeding: not applicable Details of procedure: Timeout was conducted, after Patient was taken operating room prepped and draped for vaginal procedure. cervix was grasped with single-tooth tenaculum with speculum in place, and the uterus sounded to 9 cm dilated to 25 [place], and hysteroscopy performed revealing polyps which could be sharply dissected and then curettage emptying the endometrial cavity. Photos documented both the thickened endometrium and polyps and then the well curetted endometrial cavity. The NovaSure endometrial ablation device was then prepared activated and used through a standard activation sequence as described in the nursing documentation with a 1-1/2 minute, ablation sequence completed. As satisfactory thermal changes of the tissue were apparent on the ablation device when it was removed.- Paracervical block had been applied  at the start of the case with 20 cc of Marcaine. Patient tolerated procedure well and went to recovery room in stable condition

## 2016-07-21 NOTE — Discharge Instructions (Signed)
Endometrial Ablation Endometrial ablation removes the lining of the uterus (endometrium). It is usually a same-day, outpatient treatment. Ablation helps avoid major surgery, such as surgery to remove the cervix and uterus (hysterectomy). After endometrial ablation, you will have little or no menstrual bleeding and may not be able to have children. However, if you are premenopausal, you will need to use a reliable method of birth control following the procedure because of the small chance that pregnancy can occur. There are different reasons to have this procedure. These reasons include:  Heavy periods.  Bleeding that is causing anemia.  Irregular bleeding.  Bleeding fibroids on the lining inside the uterus if they are smaller than 3 centimeters. This procedure may not be possible for you if:   You want to have children in the future.   You have severe cramps with your menstrual period.   You have precancerous or cancerous cells in your uterus.   You were recently pregnant.   You have gone through menopause.   You have had major surgery on your uterus, resulting in thinning of the uterine wall. Surgeries may include:  The removal of one or more uterine fibroids (myomectomy).  A cesarean section with a classic (vertical) incision on your uterus. Ask your health care provider what type of cesarean you had. Sometimes the scar on your skin is different than the scar on your uterus. Even if you have had surgery on your uterus, certain types of ablation may still be safe for you. Talk with your health care provider. LET YOUR HEALTH CARE PROVIDER KNOW ABOUT:  Any allergies you have.  All medicines you are taking, including vitamins, herbs, eye drops, creams, and over-the-counter medicines.  Previous problems you or members of your family have had with the use of anesthetics.  Any blood disorders you have.  Previous surgeries you have had.  Medical conditions you have. RISKS AND  COMPLICATIONS  Generally, this is a safe procedure. However, as with any procedure, complications can occur. Possible complications include:  Perforation of the uterus.  Bleeding.  Infection of the uterus, bladder, or vagina.  Injury to surrounding organs.  An air bubble to the lung (air embolus).  Pregnancy following the procedure.  Failure of the procedure to help the problem, requiring hysterectomy.  Decreased ability to diagnose cancer in the lining of the uterus. BEFORE THE PROCEDURE  The lining of the uterus must be tested to make sure there is no pre-cancerous or cancer cells present.  An ultrasound may be performed to look at the size of the uterus and to check for abnormalities.  Medicines may be given to thin the lining of the uterus. PROCEDURE  During the procedure, your health care provider will use a tool called a resectoscope to help see inside your uterus. There are different ways to remove the lining of your uterus.   Radiofrequency - This method uses a radiofrequency-alternating electric current to remove the lining of the uterus.  Cryotherapy - This method uses extreme cold to freeze the lining of the uterus.  Heated-Free Liquid - This method uses heated salt (saline) solution to remove the lining of the uterus.  Microwave - This method uses high-energy microwaves to heat up the lining of the uterus to remove it.  Thermal balloon - This method involves inserting a catheter with a balloon tip into the uterus. The balloon tip is filled with heated fluid to remove the lining of the uterus. AFTER THE PROCEDURE  After your procedure, do   not have sexual intercourse or insert anything into your vagina until permitted by your health care provider. After the procedure, you may experience:  Cramps.  Vaginal discharge.  Frequent urination. This information is not intended to replace advice given to you by your health care provider. Make sure you discuss any  questions you have with your health care provider. Document Released: 07/03/2004 Document Revised: 05/15/2015 Document Reviewed: 01/25/2013 Elsevier Interactive Patient Education  2017 [employer]  

## 2016-07-21 NOTE — Anesthesia Postprocedure Evaluation (Signed)
Anesthesia Post Note  Patient: Michelle Sellers  Procedure(s) Performed: Procedure(s) (LRB): DILATATION & CURETTAGE/HYSTEROSCOPY WITH NOVASURE ENDOMETRIAL ABLATION (N/A) REMOVAL OF ENDOMETRIAL POLYP (N/A)  Patient location during evaluation: PACU Anesthesia Type: General Level of consciousness: awake, patient cooperative and responds to stimulation Pain management: pain level controlled Vital Signs Assessment: post-procedure vital signs reviewed and stable Respiratory status: spontaneous breathing, patient connected to nasal cannula oxygen and nonlabored ventilation Cardiovascular status: stable Anesthetic complications: no    Last Vitals:  Vitals:   07/21/16 0705 07/21/16 0710  BP:  126/83  Resp: (!) 23 18  Temp:      Last Pain: There were no vitals filed for this visit.               Nami Strawder

## 2016-07-21 NOTE — Anesthesia Preprocedure Evaluation (Signed)
Anesthesia Evaluation  Patient identified by MRN, date of birth, ID band Patient awake    Reviewed: Allergy & Precautions, NPO status , Patient's Chart, lab work & pertinent test results  Airway Mallampati: II  TM Distance: >3 FB     Dental  (+) Teeth Intact   Pulmonary neg pulmonary ROS,    breath sounds clear to auscultation       Cardiovascular hypertension, Pt. on medications  Rhythm:Regular Rate:Normal     Neuro/Psych Anxiety    GI/Hepatic negative GI ROS,   Endo/Other    Renal/GU      Musculoskeletal   Abdominal   Peds  Hematology   Anesthesia Other Findings   Reproductive/Obstetrics                             Anesthesia Physical Anesthesia Plan  ASA: II  Anesthesia Plan: General   Post-op Pain Management:    Induction: Intravenous  Airway Management Planned: LMA  Additional Equipment:   Intra-op Plan:   Post-operative Plan: Extubation in OR  Informed Consent: I have reviewed the patients History and Physical, chart, labs and discussed the procedure including the risks, benefits and alternatives for the proposed anesthesia with the patient or authorized representative who has indicated his/her understanding and acceptance.     Plan Discussed with:   Anesthesia Plan Comments:         Anesthesia Quick Evaluation

## 2016-07-21 NOTE — Op Note (Signed)
Please see the brief operative note for surgical details 

## 2016-07-21 NOTE — Interval H&P Note (Signed)
History and Physical Interval Note:  07/21/2016 7:11 AM  Michelle Sellers  has presented today for surgery, with the diagnosis of menorrhagia endometrial polyp  The various methods of treatment have been discussed with the patient and family. After consideration of risks, benefits and other options for treatment, the patient has consented to  Procedure(s): DILATATION & CURETTAGE/HYSTEROSCOPY WITH NOVASURE ENDOMETRIAL ABLATION (N/A) REMOVAL OF POLYP (N/A) as a surgical intervention .  The patient's history has been reviewed, patient examined, no change in status, stable for surgery.  I have reviewed the patient's chart and labs.  Questions were answered to the patient's satisfaction.  I have again specifically discussed the option of tubal sterilization at the time of endometrial ablation, to eliminate the need for other contraception. The patient has NO desire for future contraception other than the condoms they are currently using . Husband is in the room for this confirmation and agrees with this plan.    Tilda BurrowFERGUSON,Hortensia Duffin V

## 2016-07-22 ENCOUNTER — Telehealth: Payer: Self-pay | Admitting: Obstetrics and Gynecology

## 2016-07-22 ENCOUNTER — Encounter (HOSPITAL_COMMUNITY): Payer: Self-pay | Admitting: Obstetrics and Gynecology

## 2016-07-22 NOTE — Telephone Encounter (Signed)
Pt states she had surgery, endometrial ablation yesterday with Dr. Emelda FearFerguson, c/o fever of 100.3 and sore throat. Pt informed to take OTC Tylenol for fever and gargle with warm salt water, if no improvement will need to call our office back. Pt verbalized understanding.  Cyril MourningJennifer Griffin, NP agreed with above recommendation.

## 2016-08-03 ENCOUNTER — Encounter: Payer: BLUE CROSS/BLUE SHIELD | Admitting: Obstetrics and Gynecology

## 2016-08-05 ENCOUNTER — Ambulatory Visit (INDEPENDENT_AMBULATORY_CARE_PROVIDER_SITE_OTHER): Payer: BLUE CROSS/BLUE SHIELD | Admitting: Obstetrics and Gynecology

## 2016-08-05 ENCOUNTER — Encounter: Payer: Self-pay | Admitting: Obstetrics and Gynecology

## 2016-08-05 VITALS — BP 112/88 | HR 65 | Ht 65.0 in | Wt 179.2 lb

## 2016-08-05 DIAGNOSIS — N840 Polyp of corpus uteri: Secondary | ICD-10-CM

## 2016-08-05 DIAGNOSIS — Z9889 Other specified postprocedural states: Secondary | ICD-10-CM

## 2016-08-05 DIAGNOSIS — Z09 Encounter for follow-up examination after completed treatment for conditions other than malignant neoplasm: Secondary | ICD-10-CM | POA: Insufficient documentation

## 2016-08-05 NOTE — Progress Notes (Signed)
Patient ID: Michelle Sellers, female   DOB: 1970/10/02, 45 y.o.   MRN: [id number]  Subjective:  Michelle Stalkeralmira Shutter is a 45 y.o. female now 2 weeks status post D&C/hysteroscopy with NovaSure endometrial ablation, removal of endometrial polyp.   Chief Complaint  Patient presents with  . Routine Post Op     Pt states she has been experiencing light pink spotting since the procedure. She denies heavy vaginal bleeding.    Review of Systems Negative except light spotting    Diet:   normal   Bowel movements : normal.  The patient is not having any pain.  Objective:  BP 112/88   Pulse 65   Ht 5\' 5"  (1.651 m)   Wt 179 lb 3.2 oz (81.3 kg)   BMI 29.82 kg/m  General:Well developed, well nourished.  No acute distress. Abdomen: Bowel sounds normal, soft, non-tender. Pelvic Exam:    External Genitalia:  Normal.    Vagina: Normal light vag inal pink d/c no purulence or malodor.    Cervix: Normal    Uterus: Normal    Adnexa/Bimanual: Normal  Incision(s):   Healing well, minimal vaginal drainage, no erythema, no hernia, no swelling, no dehiscence,   Assessment:  Post-Op 2 weeks status post D&C/hysteroscopy with NovaSure endometrial ablation, removal of endometrial polyp.    Doing well postoperatively.   Plan:  1.Wound care discussed   2. . current medications: none 3. Activity restrictions: may resume sexual activity as comfortable  4. return to work: not applicable. 5. Follow up in 1 year or PRN.   By signing my name below, I, Doreatha MartinEva Mathews, attest that this documentation has been prepared under the direction and in the presence of Tilda BurrowJohn V Tajai Suder, MD. Electronically Signed: Doreatha MartinEva Mathews, ED Scribe. 08/05/16. 11:31 AM.  I personally performed the services described in this documentation, which was SCRIBED in my presence. The recorded information has been reviewed and considered accurate. It has been edited as necessary during review. Tilda BurrowFERGUSON,Lodema Parma V, MD

## 2016-10-29 ENCOUNTER — Encounter: Payer: Self-pay | Admitting: Family Medicine

## 2016-10-29 ENCOUNTER — Ambulatory Visit (INDEPENDENT_AMBULATORY_CARE_PROVIDER_SITE_OTHER): Payer: BLUE CROSS/BLUE SHIELD | Admitting: Family Medicine

## 2016-10-29 VITALS — BP 108/70 | HR 82 | Temp 98.7°F | Ht 65.0 in | Wt 181.0 lb

## 2016-10-29 DIAGNOSIS — R52 Pain, unspecified: Secondary | ICD-10-CM | POA: Diagnosis not present

## 2016-10-29 DIAGNOSIS — R1013 Epigastric pain: Secondary | ICD-10-CM

## 2016-10-29 DIAGNOSIS — R110 Nausea: Secondary | ICD-10-CM | POA: Diagnosis not present

## 2016-10-29 LAB — CMP14+EGFR
A/G RATIO: 1.8 (ref 1.2–2.2)
ALBUMIN: 4.4 g/dL (ref 3.5–5.5)
ALK PHOS: 51 IU/L (ref 39–117)
ALT: 14 IU/L (ref 0–32)
AST: 14 IU/L (ref 0–40)
BILIRUBIN TOTAL: 0.6 mg/dL (ref 0.0–1.2)
BUN / CREAT RATIO: 13 (ref 9–23)
BUN: 13 mg/dL (ref 6–24)
CHLORIDE: 98 mmol/L (ref 96–106)
CO2: 23 mmol/L (ref 18–29)
Calcium: 9.4 mg/dL (ref 8.7–10.2)
Creatinine, Ser: 0.97 mg/dL (ref 0.57–1.00)
GFR calc non Af Amer: 70 (ref >59)
GFR, EST AFRICAN AMERICAN: 80 (ref >59)
GLOBULIN, TOTAL: 2.5 (ref 1.5–4.5)
GLUCOSE: 83 mg/dL (ref 65–99)
POTASSIUM: 4.1 mmol/L (ref 3.5–5.2)
SODIUM: 138 mmol/L (ref 134–144)
TOTAL PROTEIN: 6.9 g/dL (ref 6.0–8.5)

## 2016-10-29 LAB — CBC WITH DIFFERENTIAL/PLATELET
BASOS ABS: 0 10*3/uL (ref 0.0–0.2)
BASOS: 0 %
EOS (ABSOLUTE): 0 10*3/uL (ref 0.0–0.4)
Eos: 1 %
HEMOGLOBIN: 12.6 g/dL (ref 11.1–15.9)
Hematocrit: 38 % (ref 34.0–46.6)
Immature Grans (Abs): 0 10*3/uL (ref 0.0–0.1)
Immature Granulocytes: 0 %
LYMPHS ABS: 2.6 10*3/uL (ref 0.7–3.1)
Lymphs: 35 %
MCH: 29.2 pg (ref 26.6–33.0)
MCHC: 33.2 g/dL (ref 31.5–35.7)
MCV: 88 fL (ref 79–97)
MONOCYTES: 8 %
Monocytes Absolute: 0.6 10*3/uL (ref 0.1–0.9)
NEUTROS ABS: 4.1 10*3/uL (ref 1.4–7.0)
Neutrophils: 56 %
Platelets: 217 10*3/uL (ref 150–379)
RBC: 4.32 x10E6/uL (ref 3.77–5.28)
RDW: 13.5 % (ref 12.3–15.4)
WBC: 7.4 10*3/uL (ref 3.4–10.8)

## 2016-10-29 LAB — VERITOR FLU A/B WAIVED
INFLUENZA B: NEGATIVE
Influenza A: NEGATIVE

## 2016-10-29 MED ORDER — PANTOPRAZOLE SODIUM 40 MG PO TBEC: 40.0000 mg | DELAYED_RELEASE_TABLET | Freq: Every day | ORAL | 11 refills | Status: DC

## 2016-10-29 MED ORDER — ONDANSETRON 8 MG PO TBDP
8.0000 mg | ORAL_TABLET | Freq: Four times a day (QID) | ORAL | 1 refills | Status: DC | PRN
Start: 2016-10-29 — End: 2017-02-04

## 2016-10-29 NOTE — Progress Notes (Signed)
Subjective:  Patient ID: Michelle Sellers, female    DOB: 05/11/1971  Age: 46 y.o. MRN: [id number]  CC: Fatigue (pt here today c/o stomach pain, horrible headache, dizzy and just feeling terrible.)   HPI Michelle Sellers presents for Onset 1 week ago of epigastric pain and loss of appetite. She's had a sensation of being very weak. Weakness is by far her biggest concern. She just doesn't feel any energy at all. She separates that out from muscle body aches which she denies. She does have a throbbing frontal headache. She has a history of migraine but this is milder than that but different. She also says that when she exerts himself she gets out of breath abilities here. However she's not had any chest pain and no cough. Her chief symptoms has been lightheadedness as well. He has been very thirsty. She is drinking a lot of water. However the nausea makes it difficult to take anything by mouth. She is not having any urinary frequency or polyuria. Nor does she have diarrhea.  History Michelle Sellers has a past medical history of Allergy; Anxiety; Chronic kidney disease; Menorrhagia with regular cycle (08/06/2014); and Peri-menopause (08/06/2014).   She has a past surgical history that includes Wisdom tooth extraction; Dilatation & currettage/hysteroscopy with novasure ablation (N/A, 07/21/2016); and polypectomy (N/A, 07/21/2016).   Her family history includes Cancer in her mother; Heart disease in her father; Hyperlipidemia in her father, maternal grandfather, maternal grandmother, mother, paternal grandfather, and paternal grandmother; Hypertension in her father and mother.She reports that she has never smoked. She has never used smokeless tobacco. She reports that she drinks about 0.6 oz of alcohol per week . She reports that she does not use drugs.  Current Outpatient Prescriptions on File Prior to Visit  Medication Sig Dispense Refill  . citalopram (CELEXA) 40 MG tablet TAKE 1 TABLET (40 MG  TOTAL) BY MOUTH DAILY. 30 tablet 2  . fexofenadine (ALLEGRA) 180 MG tablet Take 180 mg by mouth daily.    . fluticasone (FLONASE) 50 MCG/ACT nasal spray Place 2 sprays into both nostrils daily. 16 g 6  . hydrochlorothiazide (HYDRODIURIL) 25 MG tablet Take 1 tablet (25 mg total) by mouth daily. 90 tablet 1  . ibuprofen (ADVIL,MOTRIN) 200 MG tablet Take 400 mg by mouth every 6 (six) hours as needed for mild pain.    . meclizine (ANTIVERT) 12.5 MG tablet Take 1 tablet (12.5 mg total) by mouth 3 (three) times daily as needed for dizziness. (Patient taking differently: Take 12.5 mg by mouth 3 (three) times daily as needed for dizziness. ) 30 tablet 0   No current facility-administered medications on file prior to visit.     ROS Review of Systems  Constitutional: Positive for appetite change. Negative for activity change and fever.  HENT: Negative for congestion, rhinorrhea and sore throat.   Eyes: Negative for visual disturbance.  Respiratory: Negative for cough and shortness of breath.   Cardiovascular: Negative for chest pain and palpitations.  Gastrointestinal: Positive for abdominal pain, nausea and vomiting. Negative for abdominal distention, blood in stool and diarrhea.  Genitourinary: Negative for dysuria.  Musculoskeletal: Negative for arthralgias and myalgias.  Neurological: Positive for weakness (nonfocal , generalized), light-headedness and headaches.    Objective:  BP 108/70   Pulse 82   Temp 98.7 F (37.1 C) (Oral)   Ht 5' 5"  (1.651 m)   Wt 181 lb (82.1 kg)   BMI 30.12 kg/m   Physical Exam  Constitutional: She is oriented to person,  place, and time. She appears well-developed and well-nourished.  HENT:  Head: Normocephalic and atraumatic.  Mucosa & tongue are dry   Cardiovascular: Normal rate and regular rhythm.   No murmur heard. Pulmonary/Chest: Effort normal and breath sounds normal.  Abdominal: Soft. Bowel sounds are normal. She exhibits no mass. There is  tenderness (minimal at epigastrum). There is no rebound and no guarding.  Neurological: She is alert and oriented to person, place, and time.  Skin: Skin is warm and dry.  Psychiatric: She has a normal mood and affect. Her behavior is normal.    Assessment & Plan:   Michelle Sellers was seen today for fatigue.  Diagnoses and all orders for this visit:  Body aches -     Veritor Flu A/B Waived  Nausea -     CBC with Differential/Platelet -     CMP14+EGFR  Epigastric pain  Other orders -     pantoprazole (PROTONIX) 40 MG tablet; Take 1 tablet (40 mg total) by mouth daily. For stomach -     ondansetron (ZOFRAN-ODT) 8 MG disintegrating tablet; Take 1 tablet (8 mg total) by mouth every 6 (six) hours as needed for nausea or vomiting.   I have discontinued Ms. Tomb's ketorolac. I am also having her start on pantoprazole and ondansetron. Additionally, I am having her maintain her fexofenadine, fluticasone, meclizine, hydrochlorothiazide, citalopram, and ibuprofen.  Meds ordered this encounter  Medications  . pantoprazole (PROTONIX) 40 MG tablet    Sig: Take 1 tablet (40 mg total) by mouth daily. For stomach    Dispense:  30 tablet    Refill:  11  . ondansetron (ZOFRAN-ODT) 8 MG disintegrating tablet    Sig: Take 1 tablet (8 mg total) by mouth every 6 (six) hours as needed for nausea or vomiting.    Dispense:  20 tablet    Refill:  1     Follow-up: Return in about 4 days (around 11/02/2016), or if symptoms worsen or fail to improve, for abd pain.  Claretta Fraise, M.D.

## 2016-12-01 ENCOUNTER — Ambulatory Visit (INDEPENDENT_AMBULATORY_CARE_PROVIDER_SITE_OTHER): Payer: BLUE CROSS/BLUE SHIELD | Admitting: Nurse Practitioner

## 2016-12-01 ENCOUNTER — Encounter: Payer: Self-pay | Admitting: Nurse Practitioner

## 2016-12-01 VITALS — BP 119/81 | HR 83 | Temp 98.0°F | Ht 65.0 in | Wt 184.0 lb

## 2016-12-01 DIAGNOSIS — M778 Other enthesopathies, not elsewhere classified: Secondary | ICD-10-CM | POA: Diagnosis not present

## 2016-12-01 DIAGNOSIS — Z6830 Body mass index (BMI) 30.0-30.9, adult: Secondary | ICD-10-CM

## 2016-12-01 MED ORDER — PHENTERMINE HCL 37.5 MG PO TABS: 37.5000 mg | ORAL_TABLET | Freq: Every day | ORAL | 2 refills | Status: DC

## 2016-12-01 MED ORDER — PREDNISONE 10 MG (21) PO TBPK: ORAL_TABLET | ORAL | 0 refills | Status: DC

## 2016-12-01 NOTE — Progress Notes (Signed)
   Subjective:    Patient ID: Marda StalkerPalmira Horsey, female    DOB: 09-07-1971, 46 y.o.   MRN: [id number]  HPI Patient comes in today with 2 complaints: - right wrist pain - started 5 days ago- hurts to move in any direction- denies injury- rates pain 8/10. Moving it increases pain- pain is mostly on fifth finger side and radiates to wrist. - weight gain- would like something to curve her appetite. Weight is up 12lbs since November 2017    Review of Systems  Constitutional: Negative.   HENT: Negative.   Respiratory: Negative.   Cardiovascular: Negative.   Gastrointestinal: Negative.   Genitourinary: Negative.   Musculoskeletal:       Right wrist pain  Neurological: Negative.   Psychiatric/Behavioral: Negative.   All other systems reviewed and are negative.      Objective:   Physical Exam  Constitutional: She appears well-developed and well-nourished. No distress.  Cardiovascular: Normal rate and regular rhythm.   Pulmonary/Chest: Effort normal and breath sounds normal.  Musculoskeletal:  Pain on palpation pinky side of right wrist- no edema Pain on flexion and extension of wrist Pronate and supinate without pain Right grip weaker then left  Skin: Skin is warm.  Psychiatric: She has a normal mood and affect. Her behavior is normal. Judgment and thought content normal.    BP 119/81   Pulse 83   Temp 98 F (36.7 C) (Oral)   Ht 5\' 5"  (1.651 m)   Wt 184 lb (83.5 kg)   BMI 30.62 kg/m        Assessment & Plan:  1. BMI 30.0-30.9,adult Discussed diet and exercise for person with BMI >25 Will recheck weight in 3-6 months Start with 1/2 tablet daily If develop SOB or tachycardia- stop medication - phentermine (ADIPEX-P) 37.5 MG tablet; Take 1 tablet (37.5 mg total) by mouth daily before breakfast.  Dispense: 30 tablet; Refill: 2  2. Right wrist tendonitis Wear wrist splint Rest Ice BID elevate when soitting - predniSONE (STERAPRED UNI-PAK 21 TAB) 10 MG (21) TBPK  tablet; As directed x 6 days  Dispense: 21 tablet; Refill: 0   Mary-Margaret Daphine DeutscherMartin, FNP

## 2016-12-01 NOTE — Patient Instructions (Signed)
Tendinitis Tendinitis is inflammation of a tendon. A tendon is a strong cord of tissue that connects muscle to bone. Tendinitis can affect any tendon, but it most commonly affects the shoulder tendon (rotator cuff), ankle tendon (Achilles tendon), elbow tendon (triceps tendon), or one of the tendons in the wrist. What are the causes? This condition may be caused by:  Overusing a tendon or muscle. This is common.  Age-related wear and tear.  Injury.  Inflammatory conditions, such as arthritis.  Certain medicines. What increases the risk? This condition is more likely to develop in people who do activities that involve repetitive motions. What are the signs or symptoms? Symptoms of this condition may include:  Pain.  Tenderness.  Mild swelling. How is this diagnosed? This condition is diagnosed with a physical exam. You may also have tests, such as:  Ultrasound. This uses sound waves to make an image of your affected area.  MRI. How is this treated? This condition may be treated by resting, icing, applying pressure (compression), and raising (elevating) the area above the level of your heart. This is known as RICE therapy. Treatment may also include:  Medicines to help reduce inflammation or to help reduce pain.  Exercises or physical therapy to strengthen and stretch the tendon.  A brace or splint.  Surgery (rare). Follow these instructions at home:   If you have a splint or brace:   Wear the splint or brace as told by your health care provider. Remove it only as told by your health care provider.  Loosen the splint or brace if your fingers or toes tingle, become numb, or turn cold and blue.  Do not take baths, swim, or use a hot tub until your health care provider approves. Ask your health care provider if you can take showers. You may only be allowed to take sponge baths for bathing.  Do not let your splint or brace get wet if it is not waterproof.  If your splint  or brace is not waterproof, cover it with a watertight plastic bag when you take a bath or a shower.  Keep the splint or brace clean. Managing pain, stiffness, and swelling   If directed, apply ice to the affected area.  Put ice in a plastic bag.  Place a towel between your skin and the bag.  Leave the ice on for 20 minutes, 2-3 times a day.  If directed, apply heat to the affected area as often as told by your health care provider. Use the heat source that your health care provider recommends, such as a moist heat pack or a heating pad.  Place a towel between your skin and the heat source.  Leave the heat on for 20-30 minutes.  Remove the heat if your skin turns bright red. This is especially important if you are unable to feel pain, heat, or cold. You may have a greater risk of getting burned.  Move the fingers or toes of the affected limb often, if this applies. This can help to prevent stiffness and lessen swelling.  If directed, elevate the affected area above the level of your heart while you are sitting or lying down. Driving   Do not drive or operate heavy machinery while taking prescription pain medicine.  Ask your health care provider when it is safe to drive if you have a splint or brace on any part of your arm or leg. Activity   Return to your normal activities as told by your health   care provider. Ask your health care provider what activities are safe for you.  Rest the affected area as told by your health care provider.  Avoid using the affected area while you are experiencing symptoms of tendinitis.  Do exercises as told by your health care provider. General instructions   If you have a splint, do not put pressure on any part of the splint until it is fully hardened. This may take several hours.  Wear an elastic bandage or compression wrap only as told by your health care provider.  Take over-the-counter and prescription medicines only as told by your health  care provider.  Keep all follow-up visits as told by your health care provider. This is important. Contact a health care provider if:  Your symptoms do not improve.  You develop new, unexplained problems, such as numbness in your hands. This information is not intended to replace advice given to you by your health care provider. Make sure you discuss any questions you have with your health care provider. Document Released: 08/21/2000 Document Revised: 04/23/2016 Document Reviewed: 05/27/2015 Elsevier Interactive Patient Education  2017 Elsevier Inc.  

## 2016-12-14 ENCOUNTER — Other Ambulatory Visit: Payer: Self-pay | Admitting: Nurse Practitioner

## 2016-12-14 DIAGNOSIS — I10 Essential (primary) hypertension: Secondary | ICD-10-CM

## 2016-12-17 ENCOUNTER — Other Ambulatory Visit: Payer: Self-pay | Admitting: Nurse Practitioner

## 2016-12-17 DIAGNOSIS — I10 Essential (primary) hypertension: Secondary | ICD-10-CM

## 2016-12-22 ENCOUNTER — Ambulatory Visit (INDEPENDENT_AMBULATORY_CARE_PROVIDER_SITE_OTHER): Payer: BLUE CROSS/BLUE SHIELD | Admitting: Family

## 2016-12-22 ENCOUNTER — Encounter: Payer: Self-pay | Admitting: Family

## 2016-12-22 VITALS — BP 114/75 | HR 68 | Temp 98.1°F | Ht 65.0 in | Wt 182.0 lb

## 2016-12-22 DIAGNOSIS — R1031 Right lower quadrant pain: Secondary | ICD-10-CM | POA: Diagnosis not present

## 2016-12-22 DIAGNOSIS — N3001 Acute cystitis with hematuria: Secondary | ICD-10-CM | POA: Diagnosis not present

## 2016-12-22 LAB — URINALYSIS, COMPLETE
Bilirubin, UA: NEGATIVE
GLUCOSE, UA: NEGATIVE
KETONES UA: NEGATIVE
NITRITE UA: NEGATIVE
SPEC GRAV UA: 1.015 (ref 1.005–1.030)
UUROB: 0.2 mg/dL (ref 0.2–1.0)
pH, UA: 6.5 (ref 5.0–7.5)

## 2016-12-22 LAB — MICROSCOPIC EXAMINATION
BACTERIA UA: NONE SEEN
Epithelial Cells (non renal): >10 /hpf — AB (ref 0–10)
Renal Epithel, UA: NONE SEEN /hpf

## 2016-12-22 MED ORDER — NITROFURANTOIN MONOHYD MACRO 100 MG PO CAPS: 100.0000 mg | ORAL_CAPSULE | Freq: Two times a day (BID) | ORAL | 0 refills | Status: DC

## 2016-12-22 NOTE — Progress Notes (Signed)
   Subjective:    Patient ID: Michelle Sellers, female    DOB: [birth year], 46 y.o.   MRN: [id number]  Abdominal Pain  This is a new problem. The current episode started more than 1 month ago. The onset quality is gradual. The problem occurs intermittently. The problem has been waxing and waning. The pain is located in the RLQ. The pain is at a severity of 10/10. The pain is moderate. The quality of the pain is sharp. The abdominal pain does not radiate. Associated symptoms include frequency. Pertinent negatives include no constipation, diarrhea, hematuria, nausea or vomiting. The pain is aggravated by certain positions. The pain is relieved by being still. She has tried acetaminophen for the symptoms. The treatment provided no relief.      Review of Systems  Gastrointestinal: Positive for abdominal pain. Negative for constipation, diarrhea, nausea and vomiting.  Genitourinary: Positive for frequency. Negative for hematuria.  All other systems reviewed and are negative.      Objective:   Physical Exam  Constitutional: She is oriented to person, place, and time. She appears well-developed and well-nourished. No distress.  HENT:  Head: Normocephalic.  Eyes: Pupils are equal, round, and reactive to light.  Neck: Normal range of motion. Neck supple. No thyromegaly present.  Cardiovascular: Normal rate, regular rhythm, normal heart sounds and intact distal pulses.   No murmur heard. Pulmonary/Chest: Effort normal and breath sounds normal. No respiratory distress. She has no wheezes.  Abdominal: Soft. Bowel sounds are normal. She exhibits no distension. There is tenderness (mild RLQ tenderness).  Musculoskeletal: Normal range of motion. She exhibits no edema or tenderness.  Neurological: She is alert and oriented to person, place, and time.  Skin: Skin is warm and dry.  Psychiatric: She has a normal mood and affect. Her behavior is normal. Judgment and thought content normal.  Vitals  reviewed.     BP 114/75   Pulse 68   Temp 98.1 F (36.7 C) (Oral)   Ht  (1.651 m)   Wt 182 lb (82.6 kg)   BMI 30.29 kg/m      Assessment & Plan:  1. Right lower quadrant abdominal pain - Urinalysis, Complete - CBC with Differential/Platelet  2. Acute cystitis with hematuria Force fluids AZO over the counter X2 days RTO prn Culture pending - CBC with Differential/Platelet - Urine culture - nitrofurantoin, macrocrystal-monohydrate, (MACROBID) 100 MG capsule; Take 1 capsule (100 mg total) by mouth 2 (two) times daily.  Dispense: 10 capsule; Refill: 0  Will treat as UTI. Pt has hx of kidney stones. Force fluids RTO if pain worsens  Jannifer Rodney, FNP

## 2016-12-22 NOTE — Patient Instructions (Signed)

## 2016-12-23 ENCOUNTER — Telehealth: Payer: Self-pay | Admitting: Family

## 2016-12-23 LAB — CBC WITH DIFFERENTIAL/PLATELET
BASOS: 0 %
Basophils Absolute: 0 10*3/uL (ref 0.0–0.2)
EOS (ABSOLUTE): 0 10*3/uL (ref 0.0–0.4)
EOS: 1 %
HEMATOCRIT: 36.1 % (ref 34.0–46.6)
HEMOGLOBIN: 12.4 g/dL (ref 11.1–15.9)
IMMATURE GRANS (ABS): 0 10*3/uL (ref 0.0–0.1)
Immature Granulocytes: 0 %
Lymphocytes Absolute: 2.2 10*3/uL (ref 0.7–3.1)
Lymphs: 30 %
MCH: 29.3 pg (ref 26.6–33.0)
MCHC: 34.3 g/dL (ref 31.5–35.7)
MCV: 85 fL (ref 79–97)
MONOCYTES: 8 %
Monocytes Absolute: 0.6 10*3/uL (ref 0.1–0.9)
NEUTROS ABS: 4.3 10*3/uL (ref 1.4–7.0)
Neutrophils: 61 %
Platelets: 246 10*3/uL (ref 150–379)
RBC: 4.23 x10E6/uL (ref 3.77–5.28)
RDW: 14.2 % (ref 12.3–15.4)
WBC: 7.1 10*3/uL (ref 3.4–10.8)

## 2016-12-23 NOTE — Telephone Encounter (Signed)
Patient aware of lab results and that urine culture is still pending.  Will call back once we get urine culture results

## 2016-12-25 LAB — URINE CULTURE

## 2016-12-29 MED ORDER — CIPROFLOXACIN HCL 500 MG PO TABS: 500.0000 mg | ORAL_TABLET | Freq: Two times a day (BID) | ORAL | 0 refills | Status: DC

## 2016-12-29 NOTE — Addendum Note (Signed)
Addended by: Jannifer Rodney A on: 12/29/2016 09:40 AM   Modules accepted: Orders

## 2016-12-29 NOTE — Telephone Encounter (Signed)
Cipro Prescription sent to pharmacy   

## 2016-12-29 NOTE — Telephone Encounter (Signed)
Pt called back today for UA cx results. Aware of results. Finished abx Still having right side pain Needs a different abx

## 2016-12-29 NOTE — Telephone Encounter (Signed)
Patient aware that Cipro has been sent to pharmacy

## 2017-01-01 ENCOUNTER — Telehealth: Payer: Self-pay | Admitting: Nurse Practitioner

## 2017-01-01 NOTE — Telephone Encounter (Signed)
Pt was positive for UTI and I gave her another antibiotic after. If she continues to have pain she needs to be seen.

## 2017-01-01 NOTE — Telephone Encounter (Signed)
What symptoms do you have? Continuing pain from suspected uti. Saw christy week before last. Wonders if she could have a stone.  How long have you been sick? Two weeks  Have you been seen for this problem? yes  If your provider decides to give you a prescription, which pharmacy would you like for it to be sent to? [facility] in [place].   Patient informed that this information will be sent to the clinical staff for review and that they should receive a follow up call.

## 2017-01-01 NOTE — Telephone Encounter (Signed)
pt aware - will call Monday if still having problems

## 2017-01-01 NOTE — Telephone Encounter (Signed)
Seen 12/22/16 HAWKS - can you take a look and see if you can help - or should she come back in

## 2017-01-04 ENCOUNTER — Encounter: Payer: Self-pay | Admitting: Adult Health

## 2017-01-04 ENCOUNTER — Telehealth: Payer: Self-pay | Admitting: Family

## 2017-01-04 DIAGNOSIS — Z1231 Encounter for screening mammogram for malignant neoplasm of breast: Secondary | ICD-10-CM | POA: Diagnosis not present

## 2017-01-04 NOTE — Telephone Encounter (Signed)
PT needs to be seen

## 2017-01-04 NOTE — Telephone Encounter (Signed)
Left message for patient to call to schedule an appointment

## 2017-02-04 ENCOUNTER — Ambulatory Visit (INDEPENDENT_AMBULATORY_CARE_PROVIDER_SITE_OTHER): Payer: BLUE CROSS/BLUE SHIELD

## 2017-02-04 ENCOUNTER — Encounter: Payer: Self-pay | Admitting: Nurse Practitioner

## 2017-02-04 ENCOUNTER — Ambulatory Visit (INDEPENDENT_AMBULATORY_CARE_PROVIDER_SITE_OTHER): Payer: BLUE CROSS/BLUE SHIELD | Admitting: Nurse Practitioner

## 2017-02-04 VITALS — BP 109/83 | HR 92 | Temp 97.2°F | Ht 65.0 in | Wt 182.0 lb

## 2017-02-04 DIAGNOSIS — R1031 Right lower quadrant pain: Secondary | ICD-10-CM

## 2017-02-04 DIAGNOSIS — N3001 Acute cystitis with hematuria: Secondary | ICD-10-CM | POA: Diagnosis not present

## 2017-02-04 DIAGNOSIS — R311 Benign essential microscopic hematuria: Secondary | ICD-10-CM

## 2017-02-04 DIAGNOSIS — R109 Unspecified abdominal pain: Secondary | ICD-10-CM

## 2017-02-04 LAB — URINALYSIS, COMPLETE
Bilirubin, UA: NEGATIVE
Glucose, UA: NEGATIVE
Ketones, UA: NEGATIVE
NITRITE UA: NEGATIVE
PH UA: 7 (ref 5.0–7.5)
Specific Gravity, UA: 1.02 (ref 1.005–1.030)
Urobilinogen, Ur: 0.2 mg/dL (ref 0.2–1.0)

## 2017-02-04 LAB — MICROSCOPIC EXAMINATION
Epithelial Cells (non renal): >10 /hpf — AB (ref 0–10)
RBC, UA: >30 /hpf — AB (ref 0–?)
RENAL EPITHEL UA: NONE SEEN /HPF

## 2017-02-04 MED ORDER — CIPROFLOXACIN HCL 500 MG PO TABS: 500.0000 mg | ORAL_TABLET | Freq: Two times a day (BID) | ORAL | 0 refills | Status: DC

## 2017-02-04 MED ORDER — FLUCONAZOLE 150 MG PO TABS: ORAL_TABLET | ORAL | 0 refills | Status: DC

## 2017-02-04 NOTE — Patient Instructions (Signed)
Flank Pain Flank pain is pain in your side. The flank is the area of your side between your upper belly (abdomen) and your back. The pain may occur over a short period of time (acute) or may be long-term or come back often (chronic). It may be mild or very bad. Pain in this area can be caused by many different things. Follow these instructions at home:  Rest as told by your doctor.  Drink enough fluid to keep your pee (urine) clear or pale yellow.  Take over-the-counter and prescription medicines only as told by your doctor.  Keep all follow-up visits as told by your doctor. This is important. Contact a doctor if:  Medicine does not help your pain.  You have new symptoms.  Your pain gets worse.  You have a fever.  Your symptoms last longer than 2-3 days. Get help right away if:  Your tummy hurts or is swollen.  You are short of breath.  You feel sick to your stomach (nauseous) and it does not go away.  You cannot stop throwing up (vomiting).  You feel like you will pass out or you do pass out (faint).  You have blood in your pee.  You have a fever and your symptoms suddenly get worse. This information is not intended to replace advice given to you by your health care provider. Make sure you discuss any questions you have with your health care provider. Document Released: 06/02/2008 Document Revised: 05/15/2016 Document Reviewed: 05/28/2015 Elsevier Interactive Patient Education  2018 Elsevier Inc.  

## 2017-02-04 NOTE — Progress Notes (Signed)
   Subjective:    Patient ID: Marda StalkerPalmira Heuring, female    DOB: [birth year], 46 y.o.   MRN: [id number]  HPI Patient comes in today c/o worsening right flank pain- she has been treated for UTI with 2 different antibiotics but pain is worsening. Pain is constatnt and stabbing and she cannot get comfortable.    Review of Systems  Constitutional: Negative.   HENT: Negative.   Respiratory: Negative.   Cardiovascular: Negative.   Gastrointestinal: Negative.   Genitourinary: Positive for dysuria, flank pain and frequency. Negative for urgency.  Neurological: Negative.   Psychiatric/Behavioral: Negative.   All other systems reviewed and are negative.      Objective:   Physical Exam  Constitutional: She appears well-developed and well-nourished. No distress.  Cardiovascular: Normal rate and regular rhythm.   Pulmonary/Chest: Effort normal and breath sounds normal.  Abdominal: Soft. Bowel sounds are normal. There is no tenderness.  Genitourinary:  Genitourinary Comments: No CVA tenderness  Skin: Skin is warm.  Psychiatric: She has a normal mood and affect. Her behavior is normal. Judgment and thought content normal.    BP 109/83   Pulse 92   Temp 97.2 F (36.2 C) (Oral)   Ht 5\' 5"  (1.651 m)   Wt 182 lb (82.6 kg)   BMI 30.29 kg/m   KUB- moderate stool burden- no visible kidney stone-Preliminary reading by Paulene FloorMary Deven Furia, FNP  [facility]  Urine  Pos leuks  blood     Assessment & Plan:  1. Right flank pain - Urinalysis, Complete - DG Abd 1 View; Future - CT RENAL STONE STUDY  2. Benign essential microscopic hematuria Force fluids   3. Acute cystitis with hematuria Take medication as prescribe Cotton underwear Take shower not bath Cranberry juice, yogurt Force fluids AZO over the counter X2 days RTO prn  - ciprofloxacin (CIPRO) 500 MG tablet; Take 1 tablet (500 mg total) by mouth 2 (two) times daily.  Dispense: 20 tablet; Refill: 0 - fluconazole (DIFLUCAN) 150 MG tablet;  1 po q week x 4 weeks  Dispense: 4 tablet; Refill: 0  Mary-Margaret Daphine DeutscherMartin, FNP

## 2017-02-05 ENCOUNTER — Ambulatory Visit (HOSPITAL_COMMUNITY)
Admission: RE | Admit: 2017-02-05 | Discharge: 2017-02-05 | Disposition: A | Discharge status: Home / Self Care | Payer: BLUE CROSS/BLUE SHIELD | Source: Ambulatory Visit | Attending: Nurse Practitioner | Admitting: Nurse Practitioner

## 2017-02-05 DIAGNOSIS — K5730 Diverticulosis of large intestine without perforation or abscess without bleeding: Secondary | ICD-10-CM | POA: Insufficient documentation

## 2017-02-05 DIAGNOSIS — R109 Unspecified abdominal pain: Secondary | ICD-10-CM | POA: Insufficient documentation

## 2017-02-05 DIAGNOSIS — N200 Calculus of kidney: Secondary | ICD-10-CM | POA: Insufficient documentation

## 2017-02-05 DIAGNOSIS — K5732 Diverticulitis of large intestine without perforation or abscess without bleeding: Secondary | ICD-10-CM | POA: Diagnosis not present

## 2017-02-08 ENCOUNTER — Telehealth: Payer: Self-pay | Admitting: Nurse Practitioner

## 2017-02-08 ENCOUNTER — Other Ambulatory Visit: Payer: Self-pay | Admitting: Nurse Practitioner

## 2017-02-08 ENCOUNTER — Telehealth: Payer: Self-pay

## 2017-02-08 DIAGNOSIS — R109 Unspecified abdominal pain: Secondary | ICD-10-CM

## 2017-02-08 DIAGNOSIS — N200 Calculus of kidney: Secondary | ICD-10-CM

## 2017-02-08 MED ORDER — HYDROCODONE-ACETAMINOPHEN 5-325 MG PO TABS: 1.0000 | ORAL_TABLET | Freq: Four times a day (QID) | ORAL | 0 refills | Status: DC | PRN

## 2017-02-08 NOTE — Telephone Encounter (Signed)
Pt would like some medicine for pain. She says the pain is increasing and is very intense

## 2017-02-08 NOTE — Telephone Encounter (Signed)
Please advise 

## 2017-02-08 NOTE — Telephone Encounter (Signed)
Patient notified

## 2017-02-08 NOTE — Telephone Encounter (Signed)
What type of referral do you need? urology  Have you been seen at our office for this problem? yes (If no, schedule them an appointment.  They will need to be seen before a referral can be done.)  Is there a particular doctor or location that you prefer? Anywhere asap.   Patient notified that referrals can take up to a week or longer to process. If they haven't heard anything within a week they should call back and speak with the referral department.

## 2017-02-08 NOTE — Telephone Encounter (Signed)
I do not think pain she is having is coming from kidney stone- but will do referral to make sure. She does not have a rash does she?

## 2017-02-08 NOTE — Telephone Encounter (Signed)
Pain med rx ready for pick up- the only other thing I can think of is shinglkes without rash- but is unusual

## 2017-02-08 NOTE — Telephone Encounter (Signed)
Pt does not have rash but does not feel like anitbiotic is working [employer] referral ASAP

## 2017-02-24 ENCOUNTER — Ambulatory Visit (INDEPENDENT_AMBULATORY_CARE_PROVIDER_SITE_OTHER): Payer: BLUE CROSS/BLUE SHIELD | Admitting: Physician Assistant

## 2017-02-24 ENCOUNTER — Encounter: Payer: Self-pay | Admitting: Physician Assistant

## 2017-02-24 VITALS — BP 118/83 | HR 71 | Temp 97.4°F | Ht 65.0 in | Wt 173.8 lb

## 2017-02-24 DIAGNOSIS — R3129 Other microscopic hematuria: Secondary | ICD-10-CM | POA: Diagnosis not present

## 2017-02-24 DIAGNOSIS — N3001 Acute cystitis with hematuria: Secondary | ICD-10-CM

## 2017-02-24 DIAGNOSIS — R300 Dysuria: Secondary | ICD-10-CM | POA: Diagnosis not present

## 2017-02-24 LAB — MICROSCOPIC EXAMINATION
RBC, UA: >30 /hpf — AB (ref 0–?)
RENAL EPITHEL UA: NONE SEEN /HPF

## 2017-02-24 LAB — URINALYSIS, COMPLETE
Bilirubin, UA: NEGATIVE
Glucose, UA: NEGATIVE
Ketones, UA: NEGATIVE
Nitrite, UA: NEGATIVE
Specific Gravity, UA: >1.03 — ABNORMAL HIGH (ref 1.005–1.030)
UUROB: 0.2 mg/dL (ref 0.2–1.0)
pH, UA: 5.5 (ref 5.0–7.5)

## 2017-02-24 MED ORDER — FLUCONAZOLE 150 MG PO TABS: ORAL_TABLET | ORAL | 11 refills | Status: DC

## 2017-02-24 MED ORDER — TAMSULOSIN HCL 0.4 MG PO CAPS: 0.4000 mg | ORAL_CAPSULE | Freq: Every day | ORAL | 2 refills | Status: DC

## 2017-02-24 MED ORDER — NITROFURANTOIN MONOHYD MACRO 100 MG PO CAPS: 100.0000 mg | ORAL_CAPSULE | Freq: Two times a day (BID) | ORAL | 2 refills | Status: DC

## 2017-02-24 NOTE — Patient Instructions (Signed)
In a few days you may receive a survey in the mail or online from [employer] regarding your visit with us today. Please take a moment to fill this out. Your feedback is very important to our whole office. It can help us better understand your needs as well as improve your experience and satisfaction. Thank you for taking your time to complete it. We care about you.  Gladys Deckard, PA-C  

## 2017-02-25 ENCOUNTER — Other Ambulatory Visit: Payer: Self-pay | Admitting: Urology

## 2017-02-25 DIAGNOSIS — N202 Calculus of kidney with calculus of ureter: Secondary | ICD-10-CM | POA: Diagnosis not present

## 2017-02-25 DIAGNOSIS — N200 Calculus of kidney: Secondary | ICD-10-CM | POA: Diagnosis not present

## 2017-02-26 ENCOUNTER — Telehealth: Payer: Self-pay | Admitting: Nurse Practitioner

## 2017-02-26 LAB — URINE CULTURE

## 2017-02-26 NOTE — Progress Notes (Signed)
BP 118/83   Pulse 71   Temp 97.4 F (36.3 C) (Oral)   Ht 5\' 5"  (1.651 m)   Wt 173 lb 12.8 oz (78.8 kg)   BMI 28.92 kg/m    Subjective:    Patient ID: Michelle Sellers, female    DOB: March 25, 1971, 46 y.o.   MRN: [id number]  HPI: Michelle Sellers is a 46 y.o. female presenting on 02/24/2017 for Lambert Mody right side pain  In April of this year the patient had a UTI with hematuria. The culture was positive. She history with Macrobid. May 31 the patient had a recurrence of symptoms. She was treated with Cipro. She has not gotten better since then. She has had persistent hematuria. She reports that a CT was performed that was normal. A referral has been made to urology but it will not be until August. We are going to call their office to try to get an appointment for as soon as possible. On today's evaluation there was persistent hematuria and signs of possible infection again. A culture was sent. We discussed we using Macrobid as the treatment and to stay on it one daily if needed for infection prevention.  Relevant past medical, surgical, family and social history reviewed and updated as indicated. Allergies and medications reviewed and updated.  Past Medical History:  Diagnosis Date  . Allergy   . Anxiety   . Chronic kidney disease    kidney stones  . Menorrhagia with regular cycle 08/06/2014  . Peri-menopause 08/06/2014    Past Surgical History:  Procedure Laterality Date  . DILITATION & CURRETTAGE/HYSTROSCOPY WITH NOVASURE ABLATION N/A 07/21/2016   Procedure: DILATATION & CURETTAGE/HYSTEROSCOPY WITH NOVASURE ENDOMETRIAL ABLATION;  Surgeon: [name], MD;  Location: [facility];  Service: Gynecology;  Laterality: N/A;  . POLYPECTOMY N/A 07/21/2016   Procedure: REMOVAL OF ENDOMETRIAL POLYP;  Surgeon: [name], MD;  Location: [facility];  Service: Gynecology;  Laterality: N/A;  . WISDOM TOOTH EXTRACTION      Review of Systems  Constitutional: Negative.  Negative for  activity change, fatigue and fever.  HENT: Negative.   Eyes: Negative.   Respiratory: Negative.  Negative for cough.   Cardiovascular: Negative.  Negative for chest pain.  Gastrointestinal: Positive for abdominal pain.  Endocrine: Negative.   Genitourinary: Positive for flank pain and frequency. Negative for difficulty urinating, dysuria and hematuria.  Skin: Negative.   Neurological: Negative.     Allergies as of 02/24/2017   No Known Allergies     Medication List       Accurate as of 02/24/17 11:59 PM. Always use your most recent med list.          fexofenadine 180 MG tablet Commonly known as:  ALLEGRA Take 180 mg by mouth daily.   fluconazole 150 MG tablet Commonly known as:  DIFLUCAN 1 po q week x 4 weeks   fluticasone 50 MCG/ACT nasal spray Commonly known as:  FLONASE Place 2 sprays into both nostrils daily.   hydrochlorothiazide 25 MG tablet Commonly known as:  HYDRODIURIL TAKE 1 TABLET (25 MG TOTAL) BY MOUTH DAILY.   HYDROcodone-acetaminophen 5-325 MG tablet Commonly known as:  LORTAB Take 1 tablet by mouth every 6 (six) hours as needed for moderate pain.   nitrofurantoin (macrocrystal-monohydrate) 100 MG capsule Commonly known as:  MACROBID Take 1 capsule (100 mg total) by mouth 2 (two) times daily. Then one tab QD   tamsulosin 0.4 MG Caps capsule Commonly known as:  FLOMAX Take 1 capsule (  0.4 mg total) by mouth daily.          Objective:    BP 118/83   Pulse 71   Temp 97.4 F (36.3 C) (Oral)   Ht 5\' 5"  (1.651 m)   Wt 173 lb 12.8 oz (78.8 kg)   BMI 28.92 kg/m   No Known Allergies  Physical Exam  Constitutional: She is oriented to person, place, and time. She appears well-developed and well-nourished.  HENT:  Head: Normocephalic and atraumatic.  Eyes: Conjunctivae are normal. Pupils are equal, round, and reactive to light.  Cardiovascular: Normal rate, regular rhythm, normal heart sounds and intact distal pulses.   Pulmonary/Chest: Effort  normal and breath sounds normal.  Abdominal: Soft. Bowel sounds are normal. She exhibits no distension and no mass. There is tenderness in the suprapubic area. There is no rebound, no guarding and no CVA tenderness.  Neurological: She is alert and oriented to person, place, and time. She has normal reflexes.  Skin: Skin is warm and dry. No rash noted.  Psychiatric: She has a normal mood and affect. Her behavior is normal. Judgment and thought content normal.    Results for orders placed or performed in visit on 02/24/17  Urine Culture  Result Value Ref Range   Urine Culture, Routine Final report    Organism ID, Bacteria Comment   Microscopic Examination  Result Value Ref Range   WBC, UA 6-10 (A) 0 - 5 /hpf   RBC, UA >30 (A) 0 - 2 /hpf   Epithelial Cells (non renal) 0-10 0 - 10 /hpf   Renal Epithel, UA None seen None seen /hpf   Mucus, UA Present Not Estab.   Bacteria, UA Few None seen/Few  Urinalysis, Complete  Result Value Ref Range   Specific Gravity, UA >1.030 (H) 1.005 - 1.030   pH, UA 5.5 5.0 - 7.5   Color, UA Yellow Yellow   Appearance Ur Cloudy (A) Clear   Leukocytes, UA Trace (A) Negative   Protein, UA 2+ (A) Negative/Trace   Glucose, UA Negative Negative   Ketones, UA Negative Negative   RBC, UA 3+ (A) Negative   Bilirubin, UA Negative Negative   Urobilinogen, Ur 0.2 0.2 - 1.0 mg/dL   Nitrite, UA Negative Negative   Microscopic Examination See below:       Assessment & Plan:   1. Dysuria - Urine Culture - Urinalysis, Complete  2. Hematuria, microscopic Call is made to a length urology for this persistent pain and hematuria.  3. Acute cystitis with hematuria - nitrofurantoin, macrocrystal-monohydrate, (MACROBID) 100 MG capsule; Take 1 capsule (100 mg total) by mouth 2 (two) times daily. Then one tab QD  Dispense: 30 capsule; Refill: 2 - fluconazole (DIFLUCAN) 150 MG tablet; 1 po q week x 4 weeks  Dispense: 4 tablet; Refill: 11 - tamsulosin (FLOMAX) 0.4 MG CAPS  capsule; Take 1 capsule (0.4 mg total) by mouth daily.  Dispense: 30 capsule; Refill: 2 - Microscopic Examination   Current Outpatient Prescriptions:  .  fexofenadine (ALLEGRA) 180 MG tablet, Take 180 mg by mouth daily., Disp: , Rfl:  .  fluticasone (FLONASE) 50 MCG/ACT nasal spray, Place 2 sprays into both nostrils daily., Disp: 16 g, Rfl: 6 .  hydrochlorothiazide (HYDRODIURIL) 25 MG tablet, TAKE 1 TABLET (25 MG TOTAL) BY MOUTH DAILY., Disp: 90 tablet, Rfl: 1 .  HYDROcodone-acetaminophen (LORTAB) 5-325 MG tablet, Take 1 tablet by mouth every 6 (six) hours as needed for moderate pain., Disp: 40 tablet, Rfl: 0 .  fluconazole (DIFLUCAN) 150 MG tablet, 1 po q week x 4 weeks, Disp: 4 tablet, Rfl: 11 .  nitrofurantoin, macrocrystal-monohydrate, (MACROBID) 100 MG capsule, Take 1 capsule (100 mg total) by mouth 2 (two) times daily. Then one tab QD, Disp: 30 capsule, Rfl: 2 .  tamsulosin (FLOMAX) 0.4 MG CAPS capsule, Take 1 capsule (0.4 mg total) by mouth daily., Disp: 30 capsule, Rfl: 2  Continue all other maintenance medications as listed above.  Follow up plan: Return if symptoms worsen or fail to improve.  Educational handout given for survey  Remus Loffler PA-C [facility] [address]  [place], [place] [address] [phone]   02/26/2017, 1:51 PM ' Viewing of the CT showed:   1. Increased dilation of the right renal collecting system and proximal right ureter without a focal obstructing lesion. This remains concerning for chronic infection. 2. Increasing size of a stone within the right renal collecting system, now measuring 11.5 cm  Dr. Arlyn Leak will be performing surgery 03/01/17

## 2017-02-26 NOTE — Telephone Encounter (Signed)
Patient is not feeling better. She does have a kidney stone. Urologist said she does have a kidney stone. Patient will be having a procedure to try and break up the stone. Patient states that urologist told her she should have been to see them right after she had Ct scan.

## 2017-02-28 NOTE — H&P (Signed)
Office Visit Report     02/25/2017   --------------------------------------------------------------------------------   Michelle Sellers  MRN: [id number]  PRIMARY CARE:  Wardell Heath, NP  DOB: Jun 19, 1971, 46 year old Female  REFERRING:  Wardell Heath, NP  SSN: *[phone]  PROVIDER:  Jerilee Field, M.D.    LOCATION:  [facility] [phone]   --------------------------------------------------------------------------------   CC: I have kidney stones.  HPI: Michelle Sellers is a 46 year-old female patient who was referred by Wardell Heath, NP who is here for renal calculi.  The problem is on the right side. He first stated noticing pain on 12/06/2015. This is his first kidney stone. He is currently having back pain. He denies having flank pain, groin pain, nausea, vomiting, fever, and chills. He has not caught a stone in his urine strainer since his symptoms began.   He has never had surgical treatment for calculi in the past.   She had right sided pain last year. A KUB March 2017 which revealed a 10 mm UPJ stone. Due to intermittent right flank pain she then underwent a KUB Feb 04, 2017 which showed the stone may be slightly bigger at 11 mm. Finally, she underwent a CT scan of the abdomen and pelvis February 05, 2017 which again revealed a right UPJ stone measuring 11.5 mm x 10 mm (HU 1015, visible, SSD 8 cm).   Likely incidental but she had a UA with many bacteria that grew enterococcus April 2018 and a current urine culture pending. UA today with mod bac and 10-20 wbc's. She's on NF and had no dysuria. She has no fever.   ALLERGIES: None   MEDICATIONS: Hydrochlorothiazide 25 mg tablet  Tamsulosin Hcl 0.4 mg capsule, ext release 24 hr  Allegra Allergy  Fluconazole  Nitrofurantoin 100 mg capsule    GU PSH: None     PSH Notes: Endometrial ablation 07-2016    NON-GU PSH: Dental Surgery Procedure   GU PMH: None   NON-GU PMH:  Anxiety Hypercholesterolemia Hypertension   FAMILY HISTORY: Breast Cancer - Mother Kidney Stones - Father   SOCIAL HISTORY: Marital Status: Married Current Smoking Status: Patient has never smoked.   Tobacco Use Assessment Completed: Used Tobacco in last 30 days? Social Drinker.  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     Notes: 1 son 1 daughter   REVIEW OF SYSTEMS:    GU Review Female:   Patient reports get up at night to urinate. Patient denies frequent urination, hard to postpone urination, burning/ pain with urination, leakage of urine, stream starts and stops, trouble starting your stream, have to strain to urinate , erection problems, and penile pain.  Gastrointestinal (Upper):   Patient denies nausea, vomiting, and indigestion/ heartburn.  Gastrointestinal (Lower):   Patient reports constipation. Patient denies diarrhea.  Constitutional:   Patient denies fever, night sweats, weight loss, and fatigue.  Skin:   Patient denies skin rash/ lesion and itching.  Eyes:   Patient reports blurred vision. Patient denies double vision.  Ears/ Nose/ Throat:   Patient denies sore throat and sinus problems.  Hematologic/Lymphatic:   Patient denies swollen glands and easy bruising.  Cardiovascular:   Patient denies leg swelling and chest pains.  Respiratory:   Patient denies cough and shortness of breath.  Endocrine:   Patient denies excessive thirst.  Musculoskeletal:   Patient denies back pain and joint pain.  Neurological:   Patient denies headaches and dizziness.  Psychologic:   Patient reports anxiety. Patient denies  depression.   VITAL SIGNS:      02/25/2017 12:14 PM  Weight 170 lb / 77.11 kg  Height 65 in / 165.1 cm  BP 122/89 mmHg  Pulse 66 /min  Temperature 98.0 F / 37 C  BMI 28.3 kg/m   MULTI-SYSTEM PHYSICAL EXAMINATION:    Constitutional: Well-nourished. No physical deformities. Normally developed. Good grooming.  Neck: Neck symmetrical, not swollen. Normal tracheal  position.  Respiratory: No labored breathing, no use of accessory muscles.   Cardiovascular: Normal temperature, normal extremity pulses, no swelling, no varicosities.  Lymphatic: No enlargement of neck, axillae, groin.  Skin: No paleness, no jaundice, no cyanosis. No lesion, no ulcer, no rash.  Neurologic / Psychiatric: Oriented to time, oriented to place, oriented to person. No depression, no anxiety, no agitation.  Gastrointestinal: No mass, no tenderness, no rigidity, non obese abdomen.  Eyes: Normal conjunctivae. Normal eyelids.  Ears, Nose, Mouth, and Throat: Left ear no scars, no lesions, no masses. Right ear no scars, no lesions, no masses. Nose no scars, no lesions, no masses. Normal hearing. Normal lips.  Musculoskeletal: Normal gait and station of head and neck.    PAST DATA REVIEWED:  Source Of History:  Patient  Records Review:   Previous Doctor Records  X-Ray Review: KUB: Reviewed Films.  C.T. Abdomen/Pelvis: Reviewed Films.    PROCEDURES:         KUB - [id number]  A single view of the abdomen is obtained.  Bony Abnormalities:  none  Fecal Stasis:  normal  Soft Tissue:  normal bowel gas pattern   Calculi:  11-12 mm Right UPJ stone          Urinalysis w/Scope - 81001 Dipstick Dipstick Cont'd Micro  Color: Yellow Bilirubin: Neg WBC/hpf: 10 - 20/hpf  Appearance: Cloudy Ketones: Neg RBC/hpf: NS (Not Seen)  Specific Gravity: 1.025 Blood: 2+ Bacteria: Mod (26-50/hpf)  pH: 6.5 Protein: Neg Cystals: NS (Not Seen)  Glucose: Neg Urobilinogen: 0.2 Casts: NS (Not Seen)    Nitrites: Neg Trichomonas: Not Present    Leukocyte Esterase: Trace Mucous: Present      Epithelial Cells: 0 - 5/hpf      Yeast: NS (Not Seen)      Sperm: Not Present  Notes:  ASSESSMENT:      ICD-10 Details  1 GU:   Renal calculus - N20.0    PLAN:          Orders Labs Urine Culture  X-Rays: KUB        Schedule Return Visit/Planned Activity: Next Available Appointment - Schedule Surgery         Document Letter(s):  Created for Patient: Clinical Summary       Notes:   kidney stone - I reviewed the imaging with the patient and discussed with the patient the nature risks and benefits of continued surveillance, off label use of alpha blockers, shockwave lithotripsy or ureteroscopy. We discussed relative success rates and need for staged procedures. All questions answered. She elects to proceed with ESWL. Discussed a colleague may be doing the procedure. She has no LUTS, so again the bacteriuria is likely incidental but I did keep her on antibiotics to keep the urine sterile given the upcoming procedure. Cx in epic sensitive to NF and Cipro.  cc: NP Daphine Deutscher   ** Signed by Jerilee Field, M.D. on 02/25/17 at 10:45 PM (EDT)**    The information contained in this medical record document is considered private and confidential patient information. This information can only  be used for the medical diagnosis and/or medical services that are being provided by the patient's selected caregivers. This information can only be distributed outside of the patient's care if the patient agrees and signs waivers of authorization for this information to be sent to an outside source or route.  Add: pt pcp ov cx grew mixed growth. I repeated a urine cx which was negative.

## 2017-03-01 ENCOUNTER — Encounter (HOSPITAL_COMMUNITY)
Admission: RE | Disposition: A | Discharge status: Home / Self Care | Payer: Self-pay | Source: Ambulatory Visit | Attending: Urology

## 2017-03-01 ENCOUNTER — Ambulatory Visit (HOSPITAL_COMMUNITY)
Admission: RE | Admit: 2017-03-01 | Discharge: 2017-03-01 | Disposition: A | Discharge status: Home / Self Care | Payer: BLUE CROSS/BLUE SHIELD | Source: Ambulatory Visit | Attending: Urology | Admitting: Urology

## 2017-03-01 ENCOUNTER — Encounter (HOSPITAL_COMMUNITY): Payer: Self-pay | Admitting: *Deleted

## 2017-03-01 ENCOUNTER — Ambulatory Visit (HOSPITAL_COMMUNITY): Payer: BLUE CROSS/BLUE SHIELD

## 2017-03-01 DIAGNOSIS — N200 Calculus of kidney: Secondary | ICD-10-CM | POA: Diagnosis not present

## 2017-03-01 DIAGNOSIS — R200 Anesthesia of skin: Secondary | ICD-10-CM | POA: Diagnosis not present

## 2017-03-01 DIAGNOSIS — Z01818 Encounter for other preprocedural examination: Secondary | ICD-10-CM | POA: Diagnosis not present

## 2017-03-01 HISTORY — PX: EXTRACORPOREAL SHOCK WAVE LITHOTRIPSY: SHX1557

## 2017-03-01 LAB — PREGNANCY, URINE: Preg Test, Ur: NEGATIVE

## 2017-03-01 SURGERY — LITHOTRIPSY, ESWL
Anesthesia: LOCAL | Laterality: Right

## 2017-03-01 MED ORDER — DIAZEPAM 5 MG PO TABS
10.0000 mg | ORAL_TABLET | ORAL | Status: AC
  Administered 2017-03-01: 10 mg via ORAL
  Filled 2017-03-01: qty 2

## 2017-03-01 MED ORDER — PROMETHAZINE HCL 25 MG/ML IJ SOLN
12.5000 mg | Freq: Once | INTRAMUSCULAR | Status: AC
  Administered 2017-03-01: 6.25 mg via INTRAVENOUS
  Filled 2017-03-01: qty 1

## 2017-03-01 MED ORDER — SODIUM CHLORIDE 0.9 % IV SOLN
INTRAVENOUS | Status: DC
  Administered 2017-03-01 (×2): via INTRAVENOUS

## 2017-03-01 MED ORDER — DIPHENHYDRAMINE HCL 25 MG PO CAPS
25.0000 mg | ORAL_CAPSULE | ORAL | Status: AC
  Administered 2017-03-01: 25 mg via ORAL
  Filled 2017-03-01: qty 1

## 2017-03-01 MED ORDER — ACETAMINOPHEN 325 MG PO TABS
650.0000 mg | ORAL_TABLET | Freq: Once | ORAL | Status: AC
  Administered 2017-03-01: 650 mg via ORAL
  Filled 2017-03-01: qty 2

## 2017-03-01 MED ORDER — CIPROFLOXACIN HCL 500 MG PO TABS
500.0000 mg | ORAL_TABLET | ORAL | Status: AC
  Administered 2017-03-01: 500 mg via ORAL
  Filled 2017-03-01: qty 1

## 2017-03-01 NOTE — Interval H&P Note (Signed)
History and Physical Interval Note:  03/01/2017 11:26 AM  Michelle Sellers  has presented today for surgery, with the diagnosis of RIGHT RENAL STONE  The various methods of treatment have been discussed with the patient and family. After consideration of risks, benefits and other options for treatment, the patient has consented to  Procedure(s): RIGHT EXTRACORPOREAL SHOCK WAVE LITHOTRIPSY (ESWL) (Right) as a surgical intervention .  The patient's history has been reviewed, patient examined, no change in status, stable for surgery.  I have reviewed the patient's chart and labs.  Questions were answered to the patient's satisfaction.  She's been well. No dysuria or fever. KUB with stable Right UPJ stone.    [name]

## 2017-03-01 NOTE — Discharge Instructions (Signed)
Lithotripsy, Care After °This sheet gives you information about how to care for yourself after your procedure. Your health care provider may also give you more specific instructions. If you have problems or questions, contact your health care provider. °What can I expect after the procedure? °After the procedure, it is common to have: °· Some blood in your urine. This should only last for a few days. °· Soreness in your back, sides, or upper abdomen for a few days. °· Blotches or bruises on your back where the pressure wave entered the skin. °· Pain, discomfort, or nausea when pieces (fragments) of the kidney stone move through the tube that carries urine from the kidney to the bladder (ureter). Stone fragments may pass soon after the procedure, but they may continue to pass for up to 4-8 weeks. °? If you have severe pain or nausea, contact your health care provider. This may be caused by a large stone that was not broken up, and this may mean that you need more treatment. °· Some pain or discomfort during urination. °· Some pain or discomfort in the lower abdomen or (in men) at the base of the penis. ° °Follow these instructions at home: °Medicines °· Take over-the-counter and prescription medicines only as told by your health care provider. °· If you were prescribed an antibiotic medicine, take it as told by your health care provider. Do not stop taking the antibiotic even if you start to feel better. °· Do not drive for 24 hours if you were given a medicine to help you relax (sedative). °· Do not drive or use heavy machinery while taking prescription pain medicine. °Eating and drinking °· Drink enough water and fluids to keep your urine clear or pale yellow. This helps any remaining pieces of the stone to pass. It can also help prevent new stones from forming. °· Eat plenty of fresh fruits and vegetables. °· Follow instructions from your health care provider about eating and drinking restrictions. You may be  instructed: °? To reduce how much salt (sodium) you eat or drink. Check ingredients and nutrition facts on packaged foods and beverages. °? To reduce how much meat you eat. °· Eat the recommended amount of calcium for your age and gender. Ask your health care provider how much calcium you should have. °General instructions °· Get plenty of rest. °· Most people can resume normal activities 1-2 days after the procedure. Ask your health care provider what activities are safe for you. °· If directed, strain all urine through the strainer that was provided by your health care provider. °? Keep all fragments for your health care provider to see. Any stones that are found may be sent to a medical lab for examination. The stone may be as small as a grain of salt. °· Keep all follow-up visits as told by your health care provider. This is important. °Contact a health care provider if: °· You have pain that is severe or does not get better with medicine. °· You have nausea that is severe or does not go away. °· You have blood in your urine longer than your health care provider told you to expect. °· You have more blood in your urine. °· You have pain during urination that does not go away. °· You urinate more frequently than usual and this does not go away. °· You develop a rash or any other possible signs of an allergic reaction. °Get help right away if: °· You have severe pain in   your back, sides, or upper abdomen. °· You have severe pain while urinating. °· Your urine is very dark red. °· You have blood in your stool (feces). °· You cannot pass any urine at all. °· You feel a strong urge to urinate after emptying your bladder. °· You have a fever or chills. °· You develop shortness of breath, difficulty breathing, or chest pain. °· You have severe nausea that leads to persistent vomiting. °· You faint. °Summary °· After this procedure, it is common to have some pain, discomfort, or nausea when pieces (fragments) of the  kidney stone move through the tube that carries urine from the kidney to the bladder (ureter). If this pain or nausea is severe, however, you should contact your health care provider. °· Most people can resume normal activities 1-2 days after the procedure. Ask your health care provider what activities are safe for you. °· Drink enough water and fluids to keep your urine clear or pale yellow. This helps any remaining pieces of the stone to pass, and it can help prevent new stones from forming. °· If directed, strain your urine and keep all fragments for your health care provider to see. Fragments or stones may be as small as a grain of salt. °· Get help right away if you have severe pain in your back, sides, or upper abdomen or have severe pain while urinating. °This information is not intended to replace advice given to you by your health care provider. Make sure you discuss any questions you have with your health care provider. °Document Released: 09/13/2007 Document Revised: 07/15/2016 Document Reviewed: 07/15/2016 °Elsevier Interactive Patient Education © 2017 [employer] ° ° °Moderate Conscious Sedation, Adult, Care After °These instructions provide you with information about caring for yourself after your procedure. Your health care provider may also give you more specific instructions. Your treatment has been planned according to current medical practices, but problems sometimes occur. Call your health care provider if you have any problems or questions after your procedure. °What can I expect after the procedure? °After your procedure, it is common: °· To feel sleepy for several hours. °· To feel clumsy and have poor balance for several hours. °· To have poor judgment for several hours. °· To vomit if you eat too soon. ° °Follow these instructions at home: °For at least 24 hours after the procedure: ° °· Do not: °? Participate in activities where you could fall or become injured. °? Drive. °? Use heavy  machinery. °? Drink alcohol. °? Take sleeping pills or medicines that cause drowsiness. °? Make important decisions or sign legal documents. °? Take care of children on your own. °· Rest. °Eating and drinking °· Follow the diet recommended by your health care provider. °· If you vomit: °? Drink water, juice, or soup when you can drink without vomiting. °? Make sure you have little or no nausea before eating solid foods. °General instructions °· Have a responsible adult stay with you until you are awake and alert. °· Take over-the-counter and prescription medicines only as told by your health care provider. °· If you smoke, do not smoke without supervision. °· Keep all follow-up visits as told by your health care provider. This is important. °Contact a health care provider if: °· You keep feeling nauseous or you keep vomiting. °· You feel light-headed. °· You develop a rash. °· You have a fever. °Get help right away if: °· You have trouble breathing. °This information is not intended to   replace advice given to you by your health care provider. Make sure you discuss any questions you have with your health care provider. °Document Released: 06/14/2013 Document Revised: 01/27/2016 Document Reviewed: 12/14/2015 °Elsevier Interactive Patient Education © 2018 [employer] ° ° ° °Dietary Guidelines to Help Prevent Kidney Stones °Kidney stones are deposits of minerals and salts that form inside your kidneys. Your risk of developing kidney stones may be greater depending on your diet, your lifestyle, the medicines you take, and whether you have certain medical conditions. Most people can reduce their chances of developing kidney stones by following the instructions below. Depending on your overall health and the type of kidney stones you tend to develop, your dietitian may give you more specific instructions. °What are tips for following this plan? °Reading food labels °· Choose foods with "no salt added" or "low-salt"  labels. Limit your sodium intake to less than 1500 mg per day. °· Choose foods with calcium for each meal and snack. Try to eat about 300 mg of calcium at each meal. Foods that contain 200-500 mg of calcium per serving include: °? 8 oz (237 ml) of milk, fortified nondairy milk, and fortified fruit juice. °? 8 oz (237 ml) of kefir, yogurt, and soy yogurt. °? 4 oz (118 ml) of tofu. °? 1 oz of cheese. °? 1 cup (300 g) of dried figs. °? 1 cup (91 g) of cooked broccoli. °? 1-3 oz can of sardines or mackerel. °· Most people need 1000 to 1500 mg of calcium each day. Talk to your dietitian about how much calcium is recommended for you. °Shopping °· Buy plenty of fresh fruits and vegetables. Most people do not need to avoid fruits and vegetables, even if they contain nutrients that may contribute to kidney stones. °· When shopping for convenience foods, choose: °? Whole pieces of fruit. °? Premade salads with dressing on the side. °? Low-fat fruit and yogurt smoothies. °· Avoid buying frozen meals or prepared deli foods. °· Look for foods with live cultures, such as yogurt and kefir. °Cooking °· Do not add salt to food when cooking. Place a salt shaker on the table and allow each person to add his or her own salt to taste. °· Use vegetable protein, such as beans, textured vegetable protein (TVP), or tofu instead of meat in pasta, casseroles, and soups. °Meal planning °· Eat less salt, if told by your dietitian. To do this: °? Avoid eating processed or premade food. °? Avoid eating fast food. °· Eat less animal protein, including cheese, meat, poultry, or fish, if told by your dietitian. To do this: °? Limit the number of times you have meat, poultry, fish, or cheese each week. Eat a diet free of meat at least 2 days a week. °? Eat only one serving each day of meat, poultry, fish, or seafood. °? When you prepare animal protein, cut pieces into small portion sizes. For most meat and fish, one serving is about the size of one  deck of cards. °· Eat at least 5 servings of fresh fruits and vegetables each day. To do this: °? Keep fruits and vegetables on hand for snacks. °? Eat 1 piece of fruit or a handful of berries with breakfast. °? Have a salad and fruit at lunch. °? Have two kinds of vegetables at dinner. °· Limit foods that are high in a substance called oxalate. These include: °? Spinach. °? Rhubarb. °? Beets. °? Potato chips and french fries. °? Nuts. °· If   you regularly take a diuretic medicine, make sure to eat at least 1-2 fruits or vegetables high in potassium each day. These include: °? Avocado. °? Banana. °? Orange, prune, carrot, or tomato juice. °? Baked potato. °? Cabbage. °? Beans and split peas. °General instructions °· Drink enough fluid to keep your urine clear or pale yellow. This is the most important thing you can do. °· Talk to your health care provider and dietitian about taking daily supplements. Depending on your health and the cause of your kidney stones, you may be advised: °? Not to take supplements with vitamin C. °? To take a calcium supplement. °? To take a daily probiotic supplement. °? To take other supplements such as magnesium, fish oil, or vitamin B6. °· Take all medicines and supplements as told by your health care provider. °· Limit alcohol intake to no more than 1 drink a day for nonpregnant women and 2 drinks a day for men. One drink equals 12 oz of beer, 5 oz of wine, or 1½ oz of hard liquor. °· Lose weight if told by your health care provider. Work with your dietitian to find strategies and an eating plan that works best for you. °What foods are not recommended? °Limit your intake of the following foods, or as told by your dietitian. Talk to your dietitian about specific foods you should avoid based on the type of kidney stones and your overall health. °Grains °Breads. Bagels. Rolls. Baked goods. Salted crackers. Cereal. Pasta. °Vegetables °Spinach. Rhubarb. Beets. Canned vegetables. Pickles.  Olives. °Meats and other protein foods °Nuts. Nut butters. Large portions of meat, poultry, or fish. Salted or cured meats. Deli meats. Hot dogs. Sausages. °Dairy °Cheese. °Beverages °Regular soft drinks. Regular vegetable juice. °Seasonings and other foods °Seasoning blends with salt. Salad dressings. Canned soups. Soy sauce. Ketchup. Barbecue sauce. Canned pasta sauce. Casseroles. Pizza. Lasagna. Frozen meals. Potato chips. French fries. °Summary °· You can reduce your risk of kidney stones by making changes to your diet. °· The most important thing you can do is drink enough fluid. You should drink enough fluid to keep your urine clear or pale yellow. °· Ask your health care provider or dietitian how much protein from animal sources you should eat each day, and also how much salt and calcium you should have each day. °This information is not intended to replace advice given to you by your health care provider. Make sure you discuss any questions you have with your health care provider. °Document Released: 12/19/2010 Document Revised: 08/04/2016 Document Reviewed: 08/04/2016 °Elsevier Interactive Patient Education © 2017 [employer] ° ° ° °

## 2017-03-01 NOTE — Progress Notes (Signed)
Post litho, Pt discharged home with son after phenergan for nausea/vomitting and tylenol for pain 6 to 9/10 relieved. Four calls and pages to Dr Mena GoesEskridge post procedure.

## 2017-03-01 NOTE — Op Note (Signed)
Right 11 mm UPJ stone  Right ESWL - staged   Findings - stone and / or patient moved quite a bit for 1st half of procedure. Rate slowed from 90 to 60. Fragmentation appeared partial and she may need a staged procedure.

## 2017-03-02 DIAGNOSIS — N202 Calculus of kidney with calculus of ureter: Secondary | ICD-10-CM | POA: Diagnosis not present

## 2017-03-15 DIAGNOSIS — N200 Calculus of kidney: Secondary | ICD-10-CM | POA: Diagnosis not present

## 2017-03-23 ENCOUNTER — Other Ambulatory Visit: Payer: Self-pay | Admitting: Urology

## 2017-03-30 ENCOUNTER — Encounter (HOSPITAL_COMMUNITY): Payer: Self-pay | Admitting: *Deleted

## 2017-04-02 NOTE — H&P (Signed)
Post-Operative Visit Report     03/15/2017   --------------------------------------------------------------------------------   Michelle StalkerPalmira Sellers  MRN: [id number]  PRIMARY CARE:  Wardell HeathMary M. Martin, NP  DOB: November 16, 1970, 46 year old Female  REFERRING:  Wardell HeathMary M. Martin, NP  SSN: -[id number]  PROVIDER:  Jerilee FieldMatthew Eskridge, M.D.    TREATING:  Anne FuLarry Gibson    LOCATION:  [facility] [phone]   --------------------------------------------------------------------------------   CC: I have kidney stones.(Surgery)  HPI: Michelle Stalkeralmira Sellers is a 46 year-old female established patient who is here for renal calculi after a surgical intervention.  03/02/17: likely still has the core of the stone which was shocked yesterday (06/25) in the renal pelvis. She likely cracked the shell of the stone during shockwave lithotripsy and these small pieces are causing obstruction. The core of the stone remains in the renal pelvis.     July 9th: She had severe colic pain on 9/146/26 and was seen here in clinic by the on-call urologist. She was given Toradol which improved her pain. Since then she passed some small sand fragments. Lower urinary tract symptoms have improved. She's not had any additional pain/discomfort and well over 1 week. This is her first kidney stone. Denies fevers.   The problem is on the right side. She had eswl for treatment of her renal calculi. Patient denies stent, ureteroscopy, and percutaneous lithotomy. This procedure was done 03/01/2017. This is her first kidney stone. She does not have a stent in place.   She is not currently having flank pain, back pain, groin pain, nausea, vomiting, fever or chills.   She does not have dysuria. She does not have urgency. She does not have frequency.     ALLERGIES: None   MEDICATIONS: Hydrochlorothiazide 25 mg tablet  Tamsulosin Hcl 0.4 mg capsule, ext release 24 hr  Allegra Allergy  Fluconazole  Nitrofurantoin 100 mg capsule  Ultram  50 mg tablet 1-2 tablet PO Q 6 H     REVIEW OF SYSTEMS:    GU Review Female:   Patient denies frequent urination, hard to postpone urination, burning /pain with urination, get up at night to urinate, leakage of urine, stream starts and stops, trouble starting your stream, have to strain to urinate, and being pregnant.  Gastrointestinal (Upper):   Patient denies nausea, vomiting, and indigestion/ heartburn.  Gastrointestinal (Lower):   Patient denies diarrhea and constipation.  Constitutional:   Patient denies fever, night sweats, weight loss, and fatigue.  Skin:   Patient denies skin rash/ lesion and itching.  Eyes:   Patient denies blurred vision and double vision.  Ears/ Nose/ Throat:   Patient denies sore throat and sinus problems.  Hematologic/Lymphatic:   Patient denies swollen glands and easy bruising.  Cardiovascular:   Patient denies leg swelling and chest pains.  Respiratory:   Patient denies cough and shortness of breath.  Endocrine:   Patient denies excessive thirst.  Musculoskeletal:   Patient denies joint pain and back pain.  Neurological:   Patient denies headaches and dizziness.  Psychologic:   Patient denies depression and anxiety.   VITAL SIGNS:      03/15/2017 08:48 AM  Weight 170 lb / 77.11 kg  Height 65 in / 165.1 cm  BP 122/87 mmHg  Pulse 65 /min  Temperature 98.4 F / 37 C  BMI 28.3 kg/m   MULTI-SYSTEM PHYSICAL EXAMINATION:    Constitutional: Well-nourished. No physical deformities. Normally developed. Good grooming.  Respiratory: No labored breathing, no use of accessory muscles.  Cardiovascular: Normal temperature, normal extremity pulses, no swelling, no varicosities.  Skin: No paleness, no jaundice, no cyanosis. No lesion, no ulcer, no rash.  Neurologic / Psychiatric: Oriented to time, oriented to place, oriented to person. No depression, no anxiety, no agitation.  Gastrointestinal: No mass, no tenderness, no rigidity, non obese abdomen. No flank or  suprapubic tenderness.  Musculoskeletal: Spine, ribs, pelvis no bilateral tenderness. Normal gait and station of head and neck.     PAST DATA REVIEWED:  Source Of History:  Patient  Records Review:   Previous Patient Records  X-Ray Review: [facility]: Reviewed Films.     03/15/17  Urinalysis  Urine Appearance Cloudy   Urine Color Yellow   Urine Glucose Neg   Urine Bilirubin Neg   Urine Ketones Neg   Urine Specific Gravity 1.025   Urine Blood Neg   Urine pH 7.0   Urine Protein Neg   Urine Urobilinogen 0.2   Urine Nitrites Neg   Urine Leukocyte Esterase Neg   Urine WBC/hpf 0 - 5/hpf   Urine RBC/hpf 0 - 2/hpf   Urine Epithelial Cells 6 - 10/hpf   Urine Bacteria Few (10-25/hpf)   Urine Mucous Present   Urine Yeast NS (Not Seen)   Urine Trichomonas Not Present   Urine Cystals NS (Not Seen)   Urine Casts NS (Not Seen)   Urine Sperm Not Present    PROCEDURES:         K[facility] - [id number]  A single view of the abdomen is obtained.      The right-sided calculus treated with shockwave lithotripsy on 6/25 appears to have settled more within the right mid pole region further away from the UPJ as noted on previous imaging studies. The large calculus doesn't appear to have changed much in the way of size or configuration. I can't visualize any obvious calculi tracing down the anatomical expected tract of the right ureter. Bladder appears free of obstruction. Stable right-sided pelvic phlebolith noted.         Urinalysis w/Scope Dipstick Dipstick Cont'd Micro  Color: Yellow Bilirubin: Neg WBC/hpf: 0 - 5/hpf  Appearance: Cloudy Ketones: Neg RBC/hpf: 0 - 2/hpf  Specific Gravity: 1.025 Blood: Neg Bacteria: Few (10-25/hpf)  pH: 7.0 Protein: Neg Cystals: NS (Not Seen)  Glucose: Neg Urobilinogen: 0.2 Casts: NS (Not Seen)    Nitrites: Neg Trichomonas: Not Present    Leukocyte Esterase: Neg Mucous: Present      Epithelial Cells: 6 - 10/hpf      Yeast: NS (Not Seen)      Sperm: Not Present     ASSESSMENT:      ICD-10 Details  1 GU:   Renal calculus - N20.0 Right, Stone now seen in right mid-pole.   PLAN:           Orders Labs Urine Culture          Schedule Return Visit/Planned Activity: 1-2 Weeks - Schedule Surgery          Document Letter(s):  Created for Patient: Clinical Summary         Notes:   At this time clinically doing well. We discussed further management options going forward. She is open to the idea of repeat shockwave lithotripsy vs continued observation. I'll discuss with her urologist about plan of care moving forward. Patient instructed to follow-up with new or worsening lower urinary tract symptoms, uncontrollable pain/discomfort.    * Signed by Anne Fu on 03/15/17 at 9:34 AM (EDT)*  Pt has decided to proceed with ESWL after discussing risks, benefits, and potential success rate.

## 2017-04-05 ENCOUNTER — Ambulatory Visit (HOSPITAL_COMMUNITY): Payer: BLUE CROSS/BLUE SHIELD

## 2017-04-05 ENCOUNTER — Encounter (HOSPITAL_COMMUNITY): Payer: Self-pay | Admitting: *Deleted

## 2017-04-05 ENCOUNTER — Ambulatory Visit (HOSPITAL_COMMUNITY)
Admission: RE | Admit: 2017-04-05 | Discharge: 2017-04-05 | Disposition: A | Discharge status: Home / Self Care | Payer: BLUE CROSS/BLUE SHIELD | Source: Ambulatory Visit | Attending: Urology | Admitting: Urology

## 2017-04-05 ENCOUNTER — Encounter (HOSPITAL_COMMUNITY)
Admission: RE | Disposition: A | Discharge status: Home / Self Care | Payer: Self-pay | Source: Ambulatory Visit | Attending: Urology

## 2017-04-05 DIAGNOSIS — N200 Calculus of kidney: Secondary | ICD-10-CM | POA: Diagnosis not present

## 2017-04-05 DIAGNOSIS — I10 Essential (primary) hypertension: Secondary | ICD-10-CM | POA: Insufficient documentation

## 2017-04-05 DIAGNOSIS — Z79899 Other long term (current) drug therapy: Secondary | ICD-10-CM | POA: Diagnosis not present

## 2017-04-05 HISTORY — PX: EXTRACORPOREAL SHOCK WAVE LITHOTRIPSY: SHX1557

## 2017-04-05 HISTORY — DX: Personal history of urinary calculi: Z87.442

## 2017-04-05 LAB — PREGNANCY, URINE: PREG TEST UR: NEGATIVE

## 2017-04-05 SURGERY — LITHOTRIPSY, ESWL
Anesthesia: LOCAL | Laterality: Right

## 2017-04-05 MED ORDER — DIPHENHYDRAMINE HCL 25 MG PO CAPS
25.0000 mg | ORAL_CAPSULE | ORAL | Status: AC
  Administered 2017-04-05: 25 mg via ORAL
  Filled 2017-04-05: qty 1

## 2017-04-05 MED ORDER — CIPROFLOXACIN HCL 500 MG PO TABS
500.0000 mg | ORAL_TABLET | ORAL | Status: AC
  Administered 2017-04-05: 500 mg via ORAL
  Filled 2017-04-05: qty 1

## 2017-04-05 MED ORDER — DIAZEPAM 5 MG PO TABS
10.0000 mg | ORAL_TABLET | ORAL | Status: AC
  Administered 2017-04-05: 10 mg via ORAL
  Filled 2017-04-05: qty 2

## 2017-04-05 MED ORDER — SODIUM CHLORIDE 0.9 % IV SOLN
INTRAVENOUS | Status: DC
  Administered 2017-04-05: 07:00:00 via INTRAVENOUS

## 2017-04-05 NOTE — Interval H&P Note (Signed)
History and Physical Interval Note:  04/05/2017 7:47 AM  Michelle Sellers  has presented today for surgery, with the diagnosis of RIGHT RENAL STONE  The various methods of treatment have been discussed with the patient and family. After consideration of risks, benefits and other options for treatment, the patient has consented to  Procedure(s): RIGHT EXTRACORPOREAL SHOCK WAVE LITHOTRIPSY (ESWL) (Right) as a surgical intervention .  The patient's history has been reviewed, patient examined, no change in status, stable for surgery.  I have reviewed the patient's chart and labs.  Questions were answered to the patient's satisfaction.     Taige Housman,LES

## 2017-04-05 NOTE — Op Note (Signed)
See scanned chart for ESWL operative note. 

## 2017-04-05 NOTE — Discharge Instructions (Signed)
1. You should strain your urine and collect all fragments and bring them to your follow up appointment.  °2. You should take your pain medication as needed.  Please call if your pain is severe to the point that it is not controlled with your pain medication. °3. You should call if you develop fever > 101 or persistent nausea or vomiting. °4. Your doctor may prescribe tamsulosin to take to help facilitate stone passage. °

## 2017-04-06 ENCOUNTER — Encounter (HOSPITAL_COMMUNITY): Payer: Self-pay | Admitting: Urology

## 2017-04-06 DIAGNOSIS — N390 Urinary tract infection, site not specified: Secondary | ICD-10-CM | POA: Diagnosis not present

## 2017-04-06 DIAGNOSIS — N200 Calculus of kidney: Secondary | ICD-10-CM | POA: Diagnosis not present

## 2017-04-06 DIAGNOSIS — Z79899 Other long term (current) drug therapy: Secondary | ICD-10-CM | POA: Diagnosis not present

## 2017-04-06 DIAGNOSIS — R1110 Vomiting, unspecified: Secondary | ICD-10-CM | POA: Diagnosis not present

## 2017-04-06 DIAGNOSIS — R109 Unspecified abdominal pain: Secondary | ICD-10-CM | POA: Diagnosis not present

## 2017-04-06 DIAGNOSIS — Z87442 Personal history of urinary calculi: Secondary | ICD-10-CM | POA: Diagnosis not present

## 2017-04-20 DIAGNOSIS — N200 Calculus of kidney: Secondary | ICD-10-CM | POA: Diagnosis not present

## 2017-06-09 ENCOUNTER — Other Ambulatory Visit: Payer: Self-pay | Admitting: Nurse Practitioner

## 2017-06-16 NOTE — Telephone Encounter (Signed)
rx called into pharmacy

## 2017-07-12 ENCOUNTER — Other Ambulatory Visit: Payer: Self-pay | Admitting: Nurse Practitioner

## 2017-07-12 DIAGNOSIS — I10 Essential (primary) hypertension: Secondary | ICD-10-CM

## 2017-10-22 ENCOUNTER — Ambulatory Visit: Payer: BLUE CROSS/BLUE SHIELD | Admitting: Nurse Practitioner

## 2017-10-22 ENCOUNTER — Encounter: Payer: Self-pay | Admitting: Nurse Practitioner

## 2017-10-22 VITALS — BP 102/76 | HR 67 | Temp 96.9°F | Ht 65.0 in | Wt 175.0 lb

## 2017-10-22 DIAGNOSIS — N3000 Acute cystitis without hematuria: Secondary | ICD-10-CM

## 2017-10-22 DIAGNOSIS — J0100 Acute maxillary sinusitis, unspecified: Secondary | ICD-10-CM

## 2017-10-22 DIAGNOSIS — R300 Dysuria: Secondary | ICD-10-CM | POA: Diagnosis not present

## 2017-10-22 LAB — URINALYSIS, COMPLETE
Bilirubin, UA: NEGATIVE
GLUCOSE, UA: NEGATIVE
Ketones, UA: NEGATIVE
NITRITE UA: NEGATIVE
Protein, UA: NEGATIVE
SPEC GRAV UA: 1.025 (ref 1.005–1.030)
Urobilinogen, Ur: 0.2 mg/dL (ref 0.2–1.0)
pH, UA: 5.5 (ref 5.0–7.5)

## 2017-10-22 LAB — MICROSCOPIC EXAMINATION: RENAL EPITHEL UA: NONE SEEN /HPF

## 2017-10-22 MED ORDER — CEPHALEXIN 500 MG PO CAPS: 500.0000 mg | ORAL_CAPSULE | Freq: Three times a day (TID) | ORAL | 0 refills | Status: DC

## 2017-10-22 NOTE — Progress Notes (Signed)
   Subjective:    Patient ID: Michelle Sellers, female    DOB: Oct 04, 1970, 47 y.o.   MRN: [id number]  HPI  Patient come sin today with 2 complaints: - congestion and cough with sinus pressure. Slight right ear pain. Started about 4 days ago. No fever - right flank pain that started about 21 week ago. Wonders if she has UTI. slight dysuria and frequency. Has frequent nocturia.   Review of Systems  Constitutional: Negative for chills and fever.  HENT: Positive for congestion, ear pain (right), rhinorrhea, sinus pressure and sinus pain. Negative for trouble swallowing.   Respiratory: Positive for cough.   Cardiovascular: Negative.   Gastrointestinal: Negative.   Genitourinary: Positive for dysuria, frequency and urgency.  Neurological: Negative.   Psychiatric/Behavioral: Negative.   All other systems reviewed and are negative.      Objective:   Physical Exam  Constitutional: She is oriented to person, place, and time. She appears well-developed and well-nourished. She appears distressed (mild).  HENT:  Right Ear: Hearing, tympanic membrane, external ear and ear canal normal.  Left Ear: Hearing, tympanic membrane, external ear and ear canal normal.  Nose: Mucosal edema and rhinorrhea present. Right sinus exhibits maxillary sinus tenderness. Left sinus exhibits maxillary sinus tenderness.  Mouth/Throat: Uvula is midline, oropharynx is clear and moist and mucous membranes are normal.  Cardiovascular: Normal rate and regular rhythm.  Pulmonary/Chest: Effort normal and breath sounds normal.  Abdominal: Soft. There is tenderness (mild suprapubic tenderness.).  Genitourinary:  Genitourinary Comments: No CVA tenderness  Neurological: She is alert and oriented to person, place, and time.  Skin: Skin is warm.  Psychiatric: She has a normal mood and affect. Her behavior is normal. Judgment and thought content normal.   BP 102/76   Pulse 67   Temp (!) 96.9 F (36.1 C) (Oral)   Ht 5\' 5"   (1.651 m)   Wt 175 lb (79.4 kg)   BMI 29.12 kg/m         Assessment & Plan:   1. Dysuria   2. Acute cystitis without hematuria   3. Acute maxillary sinusitis, recurrence not specified    Meds ordered this encounter  Medications  . cephALEXin (KEFLEX) 500 MG capsule    Sig: Take 1 capsule (500 mg total) by mouth 3 (three) times daily.    Dispense:  21 capsule    Refill:  0    Order Specific Question:   Supervising Provider    Answer:   VINCENT, CAROL L [[id number]]   1. Take meds as prescribed 2. Use a cool mist humidifier especially during the winter months and when heat has been humid. 3. Use saline nose sprays frequently 4. Saline irrigations of the nose can be very helpful if done frequently.  * 4X daily for 1 week*  * Use of a nettie pot can be helpful with this. Follow directions with this* 5. Drink plenty of fluids 6. Keep thermostat turn down low 7.For any cough or congestion  Use plain Mucinex- regular strength or max strength is fine   * Children- consult with Pharmacist for dosing 8. For fever or aces or pains- take tylenol or ibuprofen appropriate for age and weight.  * for fevers greater than 101 orally you may alternate ibuprofen and tylenol every  3 hours.   Urine culture ordered Follow prn  Mary-Margaret Daphine DeutscherMartin, FNP

## 2017-10-22 NOTE — Patient Instructions (Signed)

## 2017-10-23 LAB — URINE CULTURE

## 2017-11-01 ENCOUNTER — Ambulatory Visit: Payer: BLUE CROSS/BLUE SHIELD | Admitting: Nurse Practitioner

## 2017-11-03 ENCOUNTER — Telehealth: Payer: Self-pay | Admitting: Nurse Practitioner

## 2017-11-03 DIAGNOSIS — R300 Dysuria: Secondary | ICD-10-CM

## 2017-11-03 NOTE — Telephone Encounter (Signed)
urine culture from 2/25 did not grow anything. I am going to do stat  eferral to urology- get some AZO OTC for symptoms.

## 2017-11-03 NOTE — Telephone Encounter (Signed)
PT was seen on 10/22/16 kidney have gotten worse, hurting, burns when you pee. Pt has been drinking plenty of fluids, she has finished anitibotic, she wasn't able to sleep last night hardly bc [facility]

## 2017-11-10 DIAGNOSIS — R1084 Generalized abdominal pain: Secondary | ICD-10-CM | POA: Diagnosis not present

## 2017-11-10 DIAGNOSIS — R300 Dysuria: Secondary | ICD-10-CM | POA: Diagnosis not present

## 2017-12-15 ENCOUNTER — Encounter: Payer: Self-pay | Admitting: Adult Health

## 2017-12-15 ENCOUNTER — Other Ambulatory Visit: Payer: Self-pay

## 2017-12-15 ENCOUNTER — Ambulatory Visit (INDEPENDENT_AMBULATORY_CARE_PROVIDER_SITE_OTHER): Payer: BLUE CROSS/BLUE SHIELD | Admitting: Adult Health

## 2017-12-15 VITALS — BP 112/78 | HR 73 | Resp 16 | Ht 65.0 in | Wt 177.0 lb

## 2017-12-15 DIAGNOSIS — N898 Other specified noninflammatory disorders of vagina: Secondary | ICD-10-CM

## 2017-12-15 DIAGNOSIS — N949 Unspecified condition associated with female genital organs and menstrual cycle: Secondary | ICD-10-CM

## 2017-12-15 DIAGNOSIS — Z01411 Encounter for gynecological examination (general) (routine) with abnormal findings: Secondary | ICD-10-CM | POA: Diagnosis not present

## 2017-12-15 DIAGNOSIS — Z1212 Encounter for screening for malignant neoplasm of rectum: Secondary | ICD-10-CM | POA: Diagnosis not present

## 2017-12-15 DIAGNOSIS — Z1211 Encounter for screening for malignant neoplasm of colon: Secondary | ICD-10-CM | POA: Diagnosis not present

## 2017-12-15 DIAGNOSIS — N9489 Other specified conditions associated with female genital organs and menstrual cycle: Secondary | ICD-10-CM

## 2017-12-15 DIAGNOSIS — Z01419 Encounter for gynecological examination (general) (routine) without abnormal findings: Secondary | ICD-10-CM

## 2017-12-15 LAB — HEMOCCULT GUIAC POC 1CARD (OFFICE): FECAL OCCULT BLD: NEGATIVE

## 2017-12-15 MED ORDER — ESTROGENS, CONJUGATED 0.625 MG/GM VA CREA: TOPICAL_CREAM | VAGINAL | 2 refills | Status: DC

## 2017-12-15 NOTE — Progress Notes (Signed)
Patient ID: Michelle Sellers, female   DOB: 07/10/1971, 47 y.o.   MRN: [id number] History of Present Illness:  Michelle Sellers is a 47 year old white female, married, G2P2 in for a well woman gyn exa, she had normal pap with negative HPV 06/17/16. PCP is [facility].   Current Medications, Allergies, Past Medical History, Past Surgical History, Family History and Social History were reviewed in [facility] electronic medical record.     Review of Systems: Patient denies any headaches(except some now with allergies), hearing loss, fatigue, blurred vision, shortness of breath, chest pain, abdominal pain, problems with bowel movements, urination, or intercourse(has pain lately). No joint pain or mood swings. +burning and dryness in vagina in last 3-4 months,no new products or partners(same one for  30 years) Had negative urinary w/u   Physical Exam:BP 112/78 (BP Location: Right Arm, Patient Position: Sitting, Cuff Size: Normal)   Pulse 73   Resp 16   Ht 5\' 5"  (1.651 m)   Wt 177 lb (80.3 kg)   BMI 29.45 kg/m  General:  Well developed, well nourished, no acute distress Skin:  Warm and dry Neck:  Midline trachea, normal thyroid, good ROM, no lymphadenopathy Lungs; Clear to auscultation bilaterally Breast:  No dominant palpable mass, retraction, or nipple discharge Cardiovascular: Regular rate and rhythm Abdomen:  Soft, non tender, no hepatosplenomegaly Pelvic:  External genitalia is normal in appearance, no lesions.  The vagina is normal in appearance. Urethra has no lesions or masses. The cervix is bulbous.  Uterus is felt to be normal size, shape, and contour.  No adnexal masses or tenderness noted.Bladder is non tender, no masses felt. Rectal: Good sphincter tone, no polyps, or hemorrhoids felt.  Hemoccult negative. Extremities/musculoskeletal:  No swelling or varicosities noted, no clubbing or cyanosis Psych:  No mood changes, alert and cooperative,seems happy PHQ 2 score  0.  Impression: 1. Encounter for well woman exam with routine gynecological exam   2. Screening for colorectal cancer   3. Vaginal dryness   4. Vaginal burning       Plan:  Meds ordered this encounter  Medications  . conjugated estrogens (PREMARIN) vaginal cream    Sig: Place 0.5 gm in vagina 2 x weekly at hs    Dispense:  42.5 g    Refill:  2    Order Specific Question:   Supervising Provider    Answer:   Duane LopeEURE, LUTHER H [[id number]]  Pap and physical in 1 year Mammogram yearly Labs with PCP Call me if flares and I will check then

## 2018-01-07 ENCOUNTER — Encounter: Payer: Self-pay | Admitting: Adult Health

## 2018-01-07 DIAGNOSIS — Z1231 Encounter for screening mammogram for malignant neoplasm of breast: Secondary | ICD-10-CM | POA: Diagnosis not present

## 2018-01-12 DIAGNOSIS — L710 Perioral dermatitis: Secondary | ICD-10-CM | POA: Diagnosis not present

## 2018-01-12 DIAGNOSIS — L821 Other seborrheic keratosis: Secondary | ICD-10-CM | POA: Diagnosis not present

## 2018-01-12 DIAGNOSIS — L820 Inflamed seborrheic keratosis: Secondary | ICD-10-CM | POA: Diagnosis not present

## 2018-04-04 ENCOUNTER — Other Ambulatory Visit: Payer: Self-pay | Admitting: Nurse Practitioner

## 2018-04-04 DIAGNOSIS — I10 Essential (primary) hypertension: Secondary | ICD-10-CM

## 2018-04-05 NOTE — Telephone Encounter (Signed)
Last seen 10/22/17  MMM

## 2018-06-21 DIAGNOSIS — L710 Perioral dermatitis: Secondary | ICD-10-CM | POA: Diagnosis not present

## 2018-07-05 ENCOUNTER — Ambulatory Visit: Payer: BLUE CROSS/BLUE SHIELD | Admitting: Family

## 2018-07-05 ENCOUNTER — Encounter: Payer: Self-pay | Admitting: Family

## 2018-07-05 VITALS — BP 107/79 | HR 67 | Temp 98.7°F | Ht 65.0 in | Wt 177.6 lb

## 2018-07-05 DIAGNOSIS — S161XXA Strain of muscle, fascia and tendon at neck level, initial encounter: Secondary | ICD-10-CM | POA: Diagnosis not present

## 2018-07-05 MED ORDER — PREDNISONE 10 MG (21) PO TBPK: ORAL_TABLET | ORAL | 0 refills | Status: DC

## 2018-07-05 MED ORDER — DICLOFENAC SODIUM 75 MG PO TBEC: 75.0000 mg | DELAYED_RELEASE_TABLET | Freq: Two times a day (BID) | ORAL | 0 refills | Status: DC

## 2018-07-05 NOTE — Patient Instructions (Signed)
Musculoskeletal Pain  Musculoskeletal pain is muscle and bone aches and pains. This pain can occur in any part of the body.  Follow these instructions at home:  · Only take medicines for pain, discomfort, or fever as told by your health care provider.  · You may continue all activities unless the activities cause more pain. When the pain lessens, slowly resume normal activities. Gradually increase the intensity and duration of the activities or exercise.  · During periods of severe pain, bed rest may be helpful. Lie or sit in any position that is comfortable, but get out of bed and walk around at least every several hours.  · If directed, put ice on the injured area.  ? Put ice in a plastic bag.  ? Place a towel between your skin and the bag.  ? Leave the ice on for 20 minutes, 2-3 times a day.  Contact a health care provider if:  · Your pain is getting worse.  · Your pain is not relieved with medicines.  · You lose function in the area of the pain if the pain is in your arms, legs, or neck.  This information is not intended to replace advice given to you by your health care provider. Make sure you discuss any questions you have with your health care provider.  Document Released: 08/24/2005 Document Revised: 02/04/2016 Document Reviewed: 04/28/2013  Elsevier Interactive Patient Education © 2017 Elsevier Inc.

## 2018-07-05 NOTE — Progress Notes (Signed)
   Subjective:    Patient ID: Michelle Sellers, female    DOB: [birth year], 47 y.o.   MRN: [id number]  Chief Complaint  Patient presents with  . back of head and neck have pain    Neck Pain   This is a new problem. The current episode started 1 to 4 weeks ago. The problem occurs intermittently. The problem has been waxing and waning. The pain is associated with nothing. The pain is present in the left side and right side. The quality of the pain is described as shooting. The pain is at a severity of 6/10. The pain is mild. The symptoms are aggravated by bending. The pain is worse during the night. Stiffness is present at night. Associated symptoms include headaches. Pertinent negatives include no fever, numbness, pain with swallowing, photophobia, trouble swallowing, visual change or weakness. She has tried acetaminophen for the symptoms. The treatment provided mild relief.      Review of Systems  Constitutional: Negative for fever.  HENT: Negative for trouble swallowing.   Eyes: Negative for photophobia.  Musculoskeletal: Positive for neck pain.  Neurological: Positive for headaches. Negative for weakness and numbness.  All other systems reviewed and are negative.      Objective:   Physical Exam  Constitutional: She is oriented to person, place, and time. She appears well-developed and well-nourished. No distress.  HENT:  Head: Normocephalic and atraumatic.  Right Ear: External ear normal.  Left Ear: External ear normal.  Mouth/Throat: Oropharynx is clear and moist.  Eyes: Pupils are equal, round, and reactive to light.  Neck: Normal range of motion. Neck supple. No thyromegaly present.  Cardiovascular: Normal rate, regular rhythm, normal heart sounds and intact distal pulses.  No murmur heard. Pulmonary/Chest: Effort normal and breath sounds normal. No respiratory distress. She has no wheezes.  Musculoskeletal: Normal range of motion. She exhibits no edema or tenderness.    Full ROM of neck, bilateral posterior pain with flexion.   Neurological: She is alert and oriented to person, place, and time. She has normal reflexes. No cranial nerve deficit.  Psychiatric: She has a normal mood and affect. Her behavior is normal. Judgment and thought content normal.  Vitals reviewed.     BP 107/79   Pulse 67   Temp 98.7 F (37.1 C) (Oral)   Ht 5\' 5"  (1.651 m)   Wt 177 lb 9.6 oz (80.6 kg)   BMI 29.55 kg/m      Assessment & Plan:  Michelle Sellers comes in today with chief complaint of back of head and neck have pain   Diagnosis and orders addressed:  1. Strain of neck muscle, initial encounter Rest Ice ROM exercises encouraged No other NSAID's while taking diclofenac RTO if symptoms worsen or do not improve  - predniSONE (STERAPRED UNI-PAK 21 TAB) 10 MG (21) TBPK tablet; Use as directed  Dispense: 21 tablet; Refill: 0 - diclofenac (VOLTAREN) 75 MG EC tablet; Take 1 tablet (75 mg total) by mouth 2 (two) times daily.  Dispense: 30 tablet; Refill: 0   Jannifer Rodney, FNP

## 2018-08-15 ENCOUNTER — Ambulatory Visit: Payer: BLUE CROSS/BLUE SHIELD | Admitting: Physician Assistant

## 2018-08-15 ENCOUNTER — Encounter: Payer: Self-pay | Admitting: Physician Assistant

## 2018-08-15 VITALS — BP 129/88 | HR 65 | Temp 97.6°F | Ht 65.0 in | Wt 178.4 lb

## 2018-08-15 DIAGNOSIS — R300 Dysuria: Secondary | ICD-10-CM | POA: Diagnosis not present

## 2018-08-15 DIAGNOSIS — N3001 Acute cystitis with hematuria: Secondary | ICD-10-CM

## 2018-08-15 LAB — URINALYSIS, COMPLETE
BILIRUBIN UA: NEGATIVE
GLUCOSE, UA: NEGATIVE
Ketones, UA: NEGATIVE
Nitrite, UA: NEGATIVE
Protein, UA: NEGATIVE
Specific Gravity, UA: 1.01 (ref 1.005–1.030)
Urobilinogen, Ur: 0.2 mg/dL (ref 0.2–1.0)
pH, UA: 7 (ref 5.0–7.5)

## 2018-08-15 LAB — MICROSCOPIC EXAMINATION
Epithelial Cells (non renal): >10 /hpf — AB (ref 0–10)
Renal Epithel, UA: NONE SEEN /hpf
WBC, UA: >30 /hpf — AB (ref 0–5)

## 2018-08-15 MED ORDER — TAMSULOSIN HCL 0.4 MG PO CAPS: 0.8000 mg | ORAL_CAPSULE | Freq: Every day | ORAL | 5 refills | Status: DC

## 2018-08-15 MED ORDER — SULFAMETHOXAZOLE-TRIMETHOPRIM 800-160 MG PO TABS: 1.0000 | ORAL_TABLET | Freq: Two times a day (BID) | ORAL | 0 refills | Status: DC

## 2018-08-15 MED ORDER — FLUCONAZOLE 150 MG PO TABS: ORAL_TABLET | ORAL | 0 refills | Status: DC

## 2018-08-15 NOTE — Progress Notes (Signed)
BP 129/88   Pulse 65   Temp 97.6 F (36.4 C) (Oral)   Ht 5\' 5"  (1.651 m)   Wt 178 lb 6.4 oz (80.9 kg)   BMI 29.69 kg/m    Subjective:    Patient ID: Michelle Sellers, female    DOB: 21-Apr-1971, 47 y.o.   MRN: [id number]  HPI: Alese Furniss is a 47 y.o. female presenting on 08/15/2018 for Urinary Tract Infection  This patient has had several days of dysuria, frequency and nocturia. There is also pain over the bladder in the suprapubic region, no back pain. Denies leakage or hematuria.  Denies fever or chills. No pain in flank area. She had history of kidney stones  Past Medical History:  Diagnosis Date  . Allergy   . Anxiety   . Chronic kidney disease    kidney stones  . History of kidney stones   . Menorrhagia with regular cycle 08/06/2014  . Peri-menopause 08/06/2014   Relevant past medical, surgical, family and social history reviewed and updated as indicated. Interim medical history since our last visit reviewed. Allergies and medications reviewed and updated. DATA REVIEWED: CHART IN EPIC  Family History reviewed for pertinent findings.  Review of Systems  Constitutional: Negative.   HENT: Negative.   Eyes: Negative.   Respiratory: Negative.   Gastrointestinal: Negative.   Genitourinary: Positive for difficulty urinating, dysuria and urgency. Negative for flank pain.    Allergies as of 08/15/2018   No Known Allergies     Medication List        Accurate as of 08/15/18  1:52 PM. Always use your most recent med list.          fexofenadine 180 MG tablet Commonly known as:  ALLEGRA Take 180 mg by mouth daily.   fluconazole 150 MG tablet Commonly known as:  DIFLUCAN 1 po q week x 4 weeks   fluticasone 50 MCG/ACT nasal spray Commonly known as:  FLONASE Place 2 sprays into both nostrils daily.   hydrochlorothiazide 25 MG tablet Commonly known as:  HYDRODIURIL TAKE 1 TABLET BY MOUTH EVERY DAY   sulfamethoxazole-trimethoprim 800-160 MG  tablet Commonly known as:  BACTRIM DS,SEPTRA DS Take 1 tablet by mouth 2 (two) times daily.   tamsulosin 0.4 MG Caps capsule Commonly known as:  FLOMAX Take 2 capsules (0.8 mg total) by mouth daily.          Objective:    BP 129/88   Pulse 65   Temp 97.6 F (36.4 C) (Oral)   Ht 5\' 5"  (1.651 m)   Wt 178 lb 6.4 oz (80.9 kg)   BMI 29.69 kg/m   No Known Allergies  Wt Readings from Last 3 Encounters:  08/15/18 178 lb 6.4 oz (80.9 kg)  07/05/18 177 lb 9.6 oz (80.6 kg)  12/15/17 177 lb (80.3 kg)    Physical Exam  Constitutional: She is oriented to person, place, and time. She appears well-developed and well-nourished.  HENT:  Head: Normocephalic and atraumatic.  Eyes: Pupils are equal, round, and reactive to light. Conjunctivae are normal.  Cardiovascular: Normal rate, regular rhythm, normal heart sounds and intact distal pulses.  Pulmonary/Chest: Effort normal and breath sounds normal.  Abdominal: Soft. Bowel sounds are normal. She exhibits no distension and no mass. There is tenderness in the suprapubic area. There is no rebound, no guarding and no CVA tenderness.  Neurological: She is alert and oriented to person, place, and time. She has normal reflexes.  Skin: Skin is warm and  dry. No rash noted.  Psychiatric: She has a normal mood and affect. Her behavior is normal. Judgment and thought content normal.        Assessment & Plan:   1. Dysuria - Urinalysis, Complete - Urine Culture  2. Acute cystitis with hematuria - sulfamethoxazole-trimethoprim (BACTRIM DS) 800-160 MG tablet; Take 1 tablet by mouth 2 (two) times daily.  Dispense: 20 tablet; Refill: 0 - fluconazole (DIFLUCAN) 150 MG tablet; 1 po q week x 4 weeks  Dispense: 4 tablet; Refill: 0   Continue all other maintenance medications as listed above.  Follow up plan: No follow-ups on file.  Educational handout given for survey  Remus LofflerAngel S. Turki Tapanes PA-C [facility] [address]   [place], [place] [id number] [phone]   08/15/2018, 1:52 PM

## 2018-08-17 LAB — URINE CULTURE

## 2018-09-04 ENCOUNTER — Other Ambulatory Visit: Payer: Self-pay | Admitting: Nurse Practitioner

## 2018-09-04 DIAGNOSIS — I10 Essential (primary) hypertension: Secondary | ICD-10-CM

## 2018-11-10 ENCOUNTER — Encounter: Payer: Self-pay | Admitting: Nurse Practitioner

## 2018-11-10 ENCOUNTER — Ambulatory Visit (INDEPENDENT_AMBULATORY_CARE_PROVIDER_SITE_OTHER): Payer: BLUE CROSS/BLUE SHIELD

## 2018-11-10 ENCOUNTER — Ambulatory Visit: Payer: BLUE CROSS/BLUE SHIELD | Admitting: Nurse Practitioner

## 2018-11-10 VITALS — BP 111/79 | HR 64 | Temp 98.0°F | Ht 65.0 in | Wt 177.0 lb

## 2018-11-10 DIAGNOSIS — K5901 Slow transit constipation: Secondary | ICD-10-CM | POA: Diagnosis not present

## 2018-11-10 DIAGNOSIS — R1012 Left upper quadrant pain: Secondary | ICD-10-CM | POA: Diagnosis not present

## 2018-11-10 MED ORDER — LUBIPROSTONE 8 MCG PO CAPS: 8.0000 ug | ORAL_CAPSULE | Freq: Two times a day (BID) | ORAL | 2 refills | Status: DC

## 2018-11-10 NOTE — Patient Instructions (Signed)

## 2018-11-10 NOTE — Progress Notes (Signed)
   Subjective:    Patient ID: Michelle Sellers, female    DOB: Jun 12, 1971, 48 y.o.   MRN: [id number]   Chief Complaint: left side pain; Bloated; and Nausea   HPI Patient comes in c/o of bloating and left sded abdominal pain. Pain worsens when she eats. Has had some nausea.   Review of Systems  Constitutional: Negative.   Respiratory: Negative.   Cardiovascular: Negative.  Negative for leg swelling.  Gastrointestinal: Positive for abdominal pain and nausea.  Musculoskeletal: Negative.   Neurological: Negative.   Psychiatric/Behavioral: Negative.   All other systems reviewed and are negative.      Objective:   Physical Exam Vitals signs and nursing note reviewed.  Constitutional:      Appearance: She is normal weight.  Cardiovascular:     Rate and Rhythm: Normal rate and regular rhythm.     Heart sounds: Normal heart sounds.  Pulmonary:     Effort: Pulmonary effort is normal.     Breath sounds: Normal breath sounds.  Abdominal:     General: Abdomen is flat. Bowel sounds are normal.     Palpations: Abdomen is soft. There is no mass.     Tenderness: There is abdominal tenderness (left upper quadrant). There is guarding (mild). There is no left CVA tenderness or rebound.     Hernia: No hernia is present.  Skin:    General: Skin is warm.  Neurological:     General: No focal deficit present.     Mental Status: She is alert.  Psychiatric:        Mood and Affect: Mood normal.        Behavior: Behavior normal.    BP 111/79   Pulse 64   Temp 98 F (36.7 C) (Oral)   Ht 5\' 5"  (1.651 m)   Wt 177 lb (80.3 kg)   BMI 29.45 kg/m   kub- moderate stool burden on left -Preliminary reading by Paulene Floor, FNP  [facility]      Assessment & Plan:  Michelle Sellers in today with chief complaint of left side pain; Bloated; and Nausea   1. Left upper quadrant pain - DG Abd 1 View; Future  2. Slow transit constipation Dose of MOM and 6oz of prune juice Increase fiber in  diet Force fluids Will start on amitiza bid - lubiprostone (AMITIZA) 8 MCG capsule; Take 1 capsule (8 mcg total) by mouth 2 (two) times daily with a meal.  Dispense: 60 capsule; Refill: 2  Mary-Margaret Daphine Deutscher, FNP

## 2018-11-15 ENCOUNTER — Telehealth: Payer: Self-pay

## 2018-11-15 DIAGNOSIS — K5904 Chronic idiopathic constipation: Secondary | ICD-10-CM

## 2018-11-15 MED ORDER — PLECANATIDE 3 MG PO TABS: 1.0000 | ORAL_TABLET | Freq: Every day | ORAL | 5 refills | Status: DC

## 2018-11-15 NOTE — Telephone Encounter (Signed)
INsurance denied Amitiza  Must try Tr[facility]

## 2018-11-17 DIAGNOSIS — Z6829 Body mass index (BMI) 29.0-29.9, adult: Secondary | ICD-10-CM | POA: Diagnosis not present

## 2018-11-17 DIAGNOSIS — L03211 Cellulitis of face: Secondary | ICD-10-CM | POA: Diagnosis not present

## 2018-11-18 ENCOUNTER — Ambulatory Visit: Payer: BLUE CROSS/BLUE SHIELD | Admitting: Family Medicine

## 2018-11-24 ENCOUNTER — Encounter: Payer: Self-pay | Admitting: Nurse Practitioner

## 2018-12-09 ENCOUNTER — Ambulatory Visit (INDEPENDENT_AMBULATORY_CARE_PROVIDER_SITE_OTHER): Payer: BLUE CROSS/BLUE SHIELD | Admitting: Nurse Practitioner

## 2018-12-09 ENCOUNTER — Encounter: Payer: Self-pay | Admitting: Nurse Practitioner

## 2018-12-09 ENCOUNTER — Other Ambulatory Visit: Payer: Self-pay

## 2018-12-09 DIAGNOSIS — J0100 Acute maxillary sinusitis, unspecified: Secondary | ICD-10-CM | POA: Diagnosis not present

## 2018-12-09 MED ORDER — AMOXICILLIN-POT CLAVULANATE 875-125 MG PO TABS: 1.0000 | ORAL_TABLET | Freq: Two times a day (BID) | ORAL | 0 refills | Status: DC

## 2018-12-09 NOTE — Progress Notes (Signed)
Patient ID: Michelle Sellers, female   DOB: [birth year], 48 y.o.   MRN: [id number]    Virtual Visit via telephone Note  I connected with Michelle Sellers on 12/09/18 at 2:05 PM by telephone and verified that I am speaking with the correct person using two identifiers. Michelle Sellers is currently located at home and her children are currently with her during visit. The provider, Mary-Margaret Daphine Deutscher, FNP is located in their office at time of visit.  I discussed the limitations, risks, security and privacy concerns of performing an evaluation and management service by telephone and the availability of in person appointments. I also discussed with the patient that there may be a patient responsible charge related to this service. The patient expressed understanding and agreed to proceed.   History and Present Illness:    Chief Complaint: Nasal Congestion   HPI Patient calls in today c/o cough and congestion for 7 days. Facial pressure and headache are bothering her the most. She denies any fever.   Review of Systems  Constitutional: Negative for chills and fever.  HENT: Positive for congestion, ear pain and sinus pain. Negative for sore throat.   Respiratory: Positive for cough (slight).   Cardiovascular: Negative.   Genitourinary: Negative.   Neurological: Positive for headaches.  Psychiatric/Behavioral: Negative.   All other systems reviewed and are negative.     Observations/Objective: Alert and oriented- answers all questions appropriately  Assessment and Plan: Michelle Sellers in today with chief complaint of Nasal Congestion   1. Acute non-recurrent maxillary sinusitis 1. Take meds as prescribed 2. Use a cool mist humidifier especially during the winter months and when heat has been humid. 3. Use saline nose sprays frequently 4. Saline irrigations of the nose can be very helpful if done frequently.  * 4X daily for 1 week*  * Use of a nettie pot can be  helpful with this. Follow directions with this* 5. Drink plenty of fluids 6. Keep thermostat turn down low 7.For any cough or congestion  Use plain Mucinex- regular strength or max strength is fine   * Children- consult with Pharmacist for dosing 8. For fever or aces or pains- take tylenol or ibuprofen appropriate for age and weight.  * for fevers greater than 101 orally you may alternate ibuprofen and tylenol every  3 hours.   Meds ordered this encounter  Medications  . amoxicillin-clavulanate (AUGMENTIN) 875-125 MG tablet    Sig: Take 1 tablet by mouth 2 (two) times daily.    Dispense:  14 tablet    Refill:  0    Order Specific Question:   Supervising Provider    Answer:   Arville Care A [[id number]]      Follow Up Instructions: prn    I discussed the assessment and treatment plan with the patient. The patient was provided an opportunity to ask questions and all were answered. The patient agreed with the plan and demonstrated an understanding of the instructions.   The patient was advised to call back or seek an in-person evaluation if the symptoms worsen or if the condition fails to improve as anticipated.  The above assessment and management plan was discussed with the patient. The patient verbalized understanding of and has agreed to the management plan. Patient is aware to call the clinic if symptoms persist or worsen. Patient is aware when to return to the clinic for a follow-up visit. Patient educated on when it is appropriate to go to the emergency department.    I  provided 8 minutes of non-face-to-face time during this encounter.    Mary-Margaret Daphine Deutscher, FNP

## 2019-02-08 DIAGNOSIS — L821 Other seborrheic keratosis: Secondary | ICD-10-CM | POA: Diagnosis not present

## 2019-02-27 ENCOUNTER — Other Ambulatory Visit: Payer: Self-pay | Admitting: Nurse Practitioner

## 2019-02-27 DIAGNOSIS — I10 Essential (primary) hypertension: Secondary | ICD-10-CM

## 2019-03-26 IMAGING — CT CT RENAL STONE PROTOCOL
2 of 4 series · 15 of 46 positions shown, 17 images · non-contrast
Comparison: CT of the abdomen and pelvis 12/06/2015

CLINICAL DATA: Right for pain for 3 months.

EXAM:
CT ABDOMEN AND PELVIS WITHOUT CONTRAST
TECHNIQUE: Multidetector CT imaging of the abdomen and pelvis was performed
following the standard protocol without IV contrast.

[Series 2: axial st · axial · 0.79mm/px · z∈[-447,-27]mm · 12 of 96 slices shown, 14 images]
[im 8/96  soft-tissue]
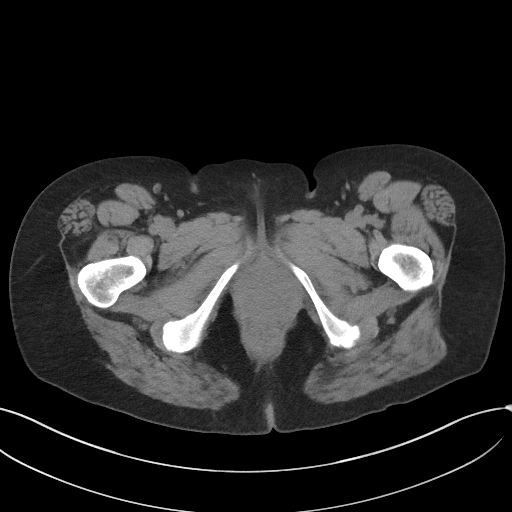
[im 8/96  bone]
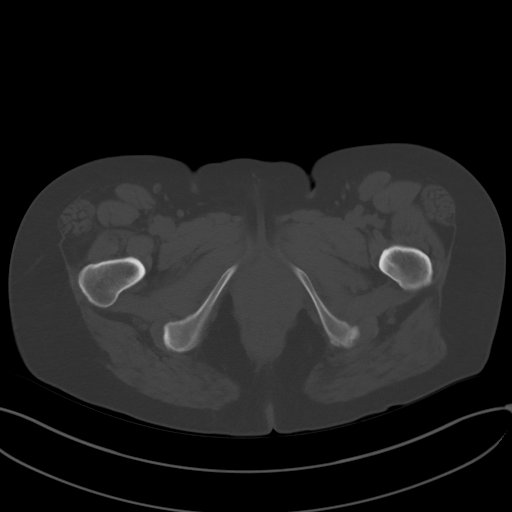
[im 16/96  soft-tissue]
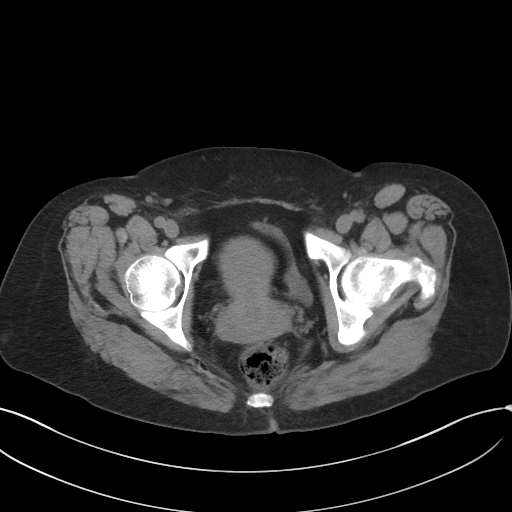
[im 23/96  soft-tissue]
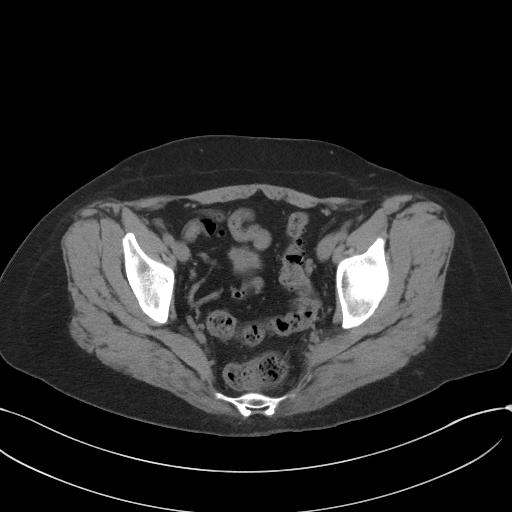
[im 31/96  soft-tissue]
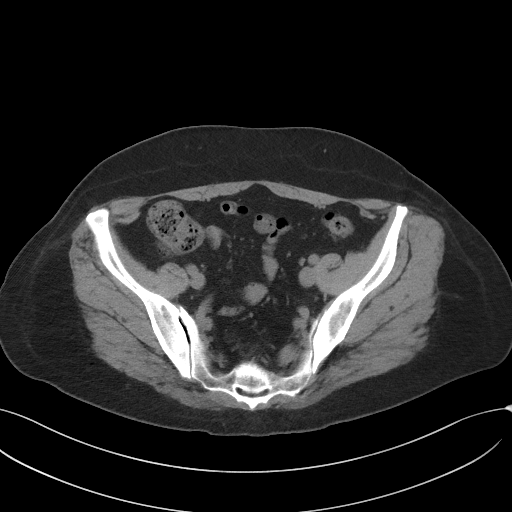
[im 39/96  soft-tissue]
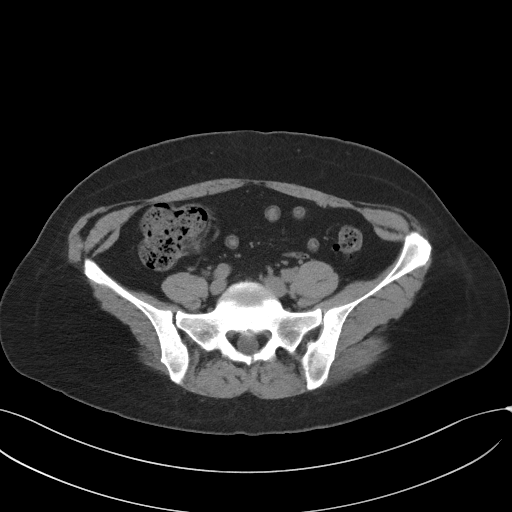
[im 46/96  soft-tissue]
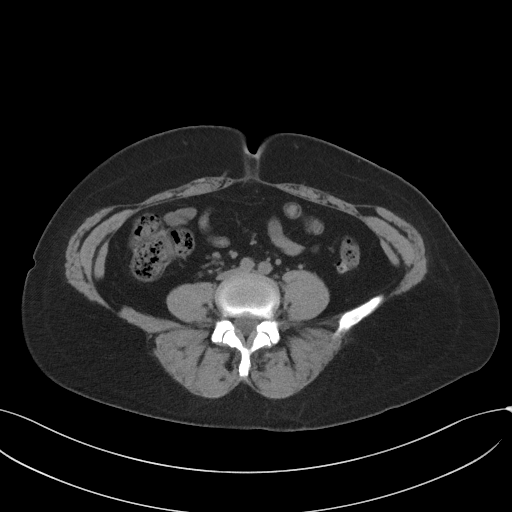
[im 54/96  soft-tissue]
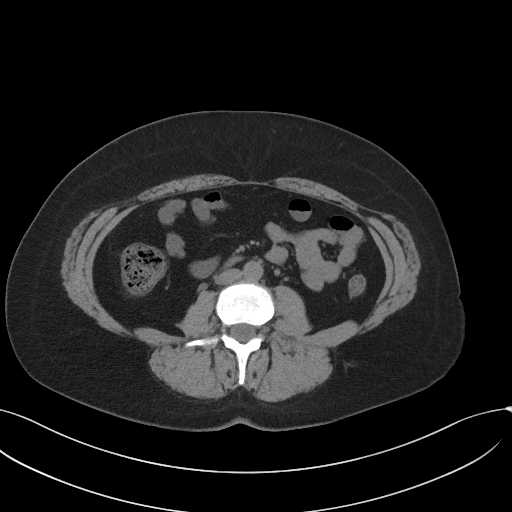
[im 61/96  soft-tissue]
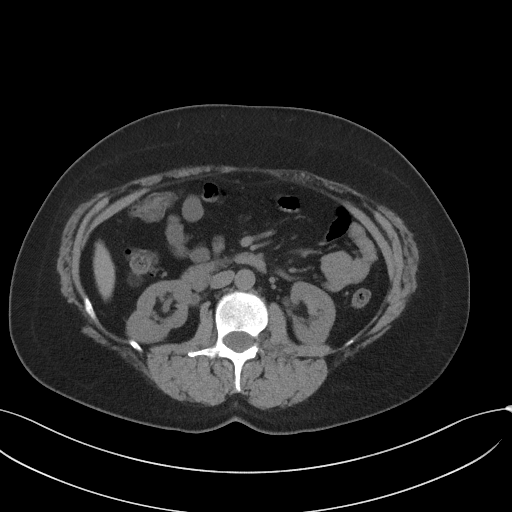
[im 69/96  soft-tissue]
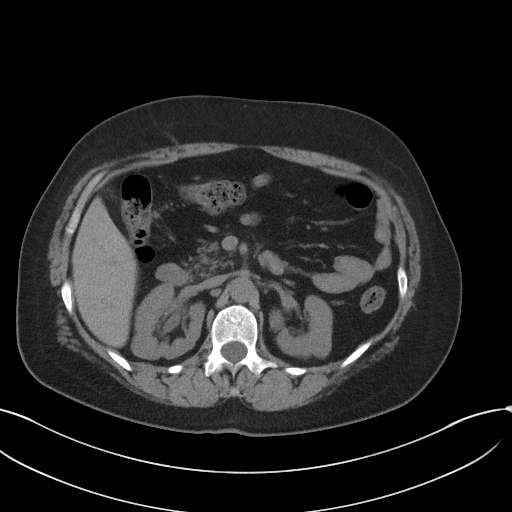
[im 69/96  bone]
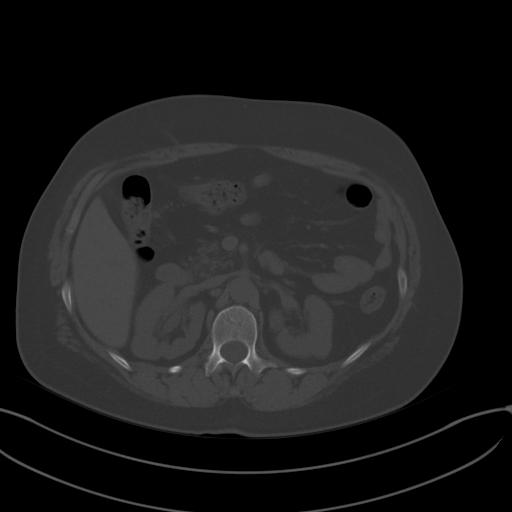
[im 77/96  soft-tissue]
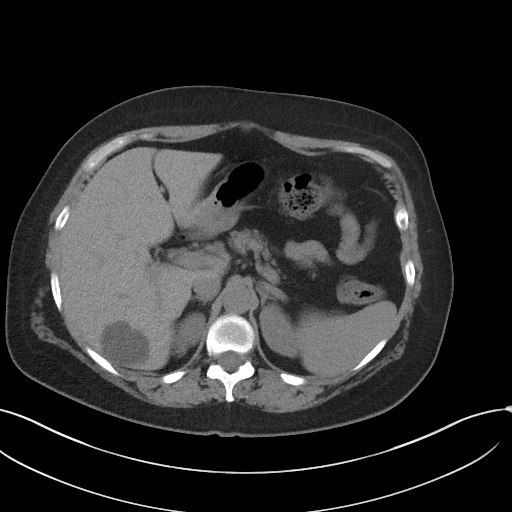
[im 84/96  soft-tissue]
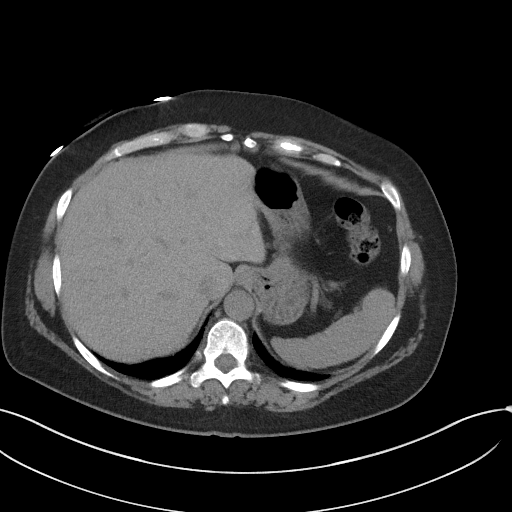
[im 92/96  soft-tissue]
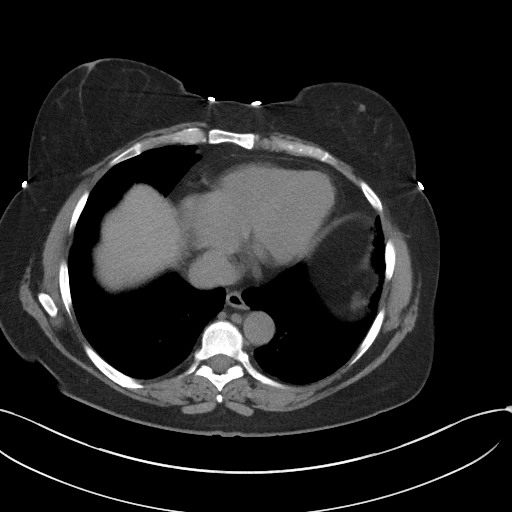

[Series 5: coronal st · coronal · 0.73mm/px · 3 of 81 slices shown]
[im 27/81  soft-tissue]
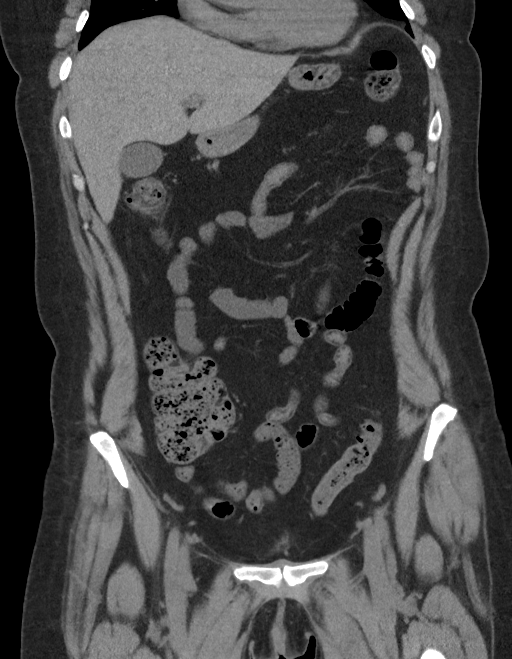
[im 36/81  soft-tissue]
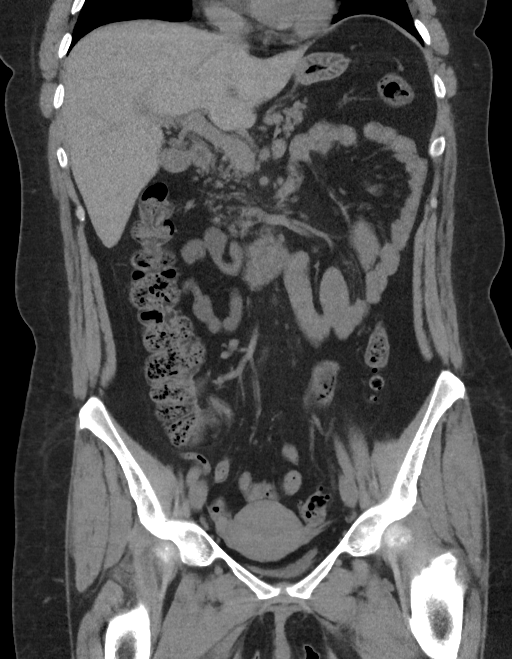
[im 45/81  soft-tissue]
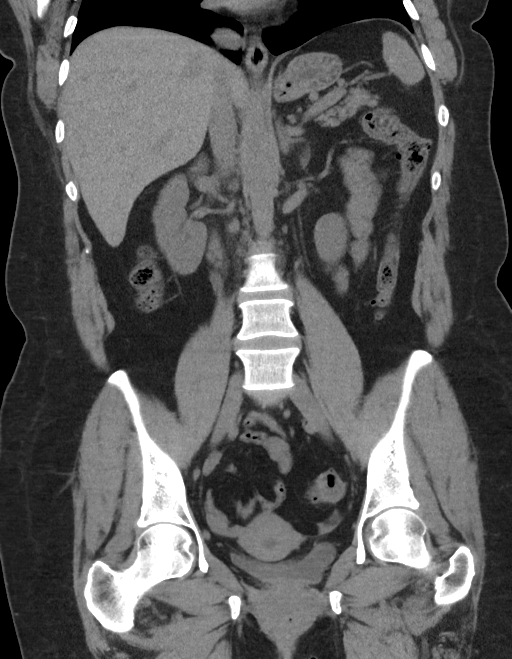

[15 of 46 positions shown; findings below may reference images not displayed]

FINDINGS: Lower chest: The lung bases are clear without focal nodule, mass, or
airspace disease. The heart size is normal. No significant pleural
or pericardial effusion is present.

Hepatobiliary: A posterior right hepatic cysts again seen. No other
focal hepatic lesions are present. The common bile duct and
gallbladder are normal.

Pancreas: Unremarkable. No pancreatic ductal dilatation or
surrounding inflammatory changes.

Spleen: Normal in size without focal abnormality.

Adrenals/Urinary Tract: Adrenal glands are normal bilaterally. A
stone within may right renal collecting system has increased in
size, now measuring up to 11 mm 11.5 mm, 10 mm previously.
Additional punctate nonobstructing stone is again seen at the upper
pole the right kidney. The right renal collecting system is further
dilated of than on the prior study. No significant left-sided stones
are present. The right ureter is dilated and thickened proximally.
No obstructing lesion is present distally. There no distal stones.
The urinary bladder is within normal limits.

Stomach/Bowel: The stomach and duodenum are within normal limits.
The small bowel is unremarkable. The appendix is visualized and
normal. The ascending and transverse colon are within normal limits.
The descending colon is unremarkable. Diverticular changes are
present throughout the sigmoid colon. No focal inflammation is
present to suggest diverticulitis.

Vascular/Lymphatic: No significant vascular findings are present. No
enlarged abdominal or pelvic lymph nodes.

Reproductive: Uterus and bilateral adnexa are unremarkable.

Other: No abdominal wall hernia or abnormality. No abdominopelvic
ascites.

Musculoskeletal: Bone windows are unremarkable. No focal lytic or
blastic lesions are present. The bony pelvis is intact. The hips are
within normal limits bilaterally. Degenerative changes are noted at
the SI joints.
IMPRESSION: 1. Increased dilation of the right renal collecting system and
proximal right ureter without a focal obstructing lesion. This
remains concerning for chronic infection.
2. Increasing size of a stone within the right renal collecting
system, now measuring 11.5 cm.
3. Punctate nonobstructing stone at the upper pole of the right
kidney.
4. Sigmoid diverticulosis without diverticulitis.

## 2019-03-31 DIAGNOSIS — Z1231 Encounter for screening mammogram for malignant neoplasm of breast: Secondary | ICD-10-CM | POA: Diagnosis not present

## 2019-05-30 ENCOUNTER — Ambulatory Visit: Payer: BLUE CROSS/BLUE SHIELD | Admitting: Nurse Practitioner

## 2019-05-30 ENCOUNTER — Other Ambulatory Visit: Payer: Self-pay

## 2019-06-13 ENCOUNTER — Other Ambulatory Visit: Payer: Self-pay

## 2019-06-13 DIAGNOSIS — Z20828 Contact with and (suspected) exposure to other viral communicable diseases: Secondary | ICD-10-CM | POA: Diagnosis not present

## 2019-06-13 DIAGNOSIS — Z20822 Contact with and (suspected) exposure to covid-19: Secondary | ICD-10-CM

## 2019-06-15 LAB — NOVEL CORONAVIRUS, NAA: SARS-CoV-2, NAA: NOT DETECTED

## 2019-06-16 ENCOUNTER — Encounter: Payer: Self-pay | Admitting: Family

## 2019-06-16 ENCOUNTER — Ambulatory Visit (INDEPENDENT_AMBULATORY_CARE_PROVIDER_SITE_OTHER): Payer: BC Managed Care – PPO | Admitting: Family

## 2019-06-16 ENCOUNTER — Other Ambulatory Visit: Payer: Self-pay

## 2019-06-16 DIAGNOSIS — B373 Candidiasis of vulva and vagina: Secondary | ICD-10-CM | POA: Diagnosis not present

## 2019-06-16 DIAGNOSIS — B3731 Acute candidiasis of vulva and vagina: Secondary | ICD-10-CM

## 2019-06-16 MED ORDER — FLUCONAZOLE 150 MG PO TABS: 150.0000 mg | ORAL_TABLET | ORAL | 0 refills | Status: DC | PRN

## 2019-06-16 NOTE — Progress Notes (Signed)
   Virtual Visit via telephone Note Due to COVID-19 pandemic this visit was conducted virtually. This visit type was conducted due to national recommendations for restrictions regarding the COVID-19 Pandemic (e.g. social distancing, sheltering in place) in an effort to limit this patient's exposure and mitigate transmission in our community. All issues noted in this document were discussed and addressed.  A physical exam was not performed with this format.  I connected with Michelle Sellers on 06/16/19 at 9:45 AM by telephone and verified that I am speaking with the correct person using two identifiers. Michelle Sellers is currently located at a friends home and no one is currently with her during visit. The provider, Evelina Dun, FNP is located in their office at time of visit.  I discussed the limitations, risks, security and privacy concerns of performing an evaluation and management service by telephone and the availability of in person appointments. I also discussed with the patient that there may be a patient responsible charge related to this service. The patient expressed understanding and agreed to proceed.   History and Present Illness:  Vaginal Itching The patient's primary symptoms include genital itching and vaginal discharge. The patient's pertinent negatives include no genital lesions or genital odor. This is a new problem. The current episode started in the past 7 days. The problem occurs intermittently. The problem has been unchanged. The patient is experiencing no pain. Associated symptoms include frequency. Pertinent negatives include no back pain, chills, discolored urine, flank pain, headaches, hematuria or urgency. The vaginal discharge was white. There has been no bleeding. She has tried antifungals for the symptoms. The treatment provided mild relief.      Review of Systems  Constitutional: Negative for chills.  Genitourinary: Positive for frequency and vaginal  discharge. Negative for flank pain, hematuria and urgency.  Musculoskeletal: Negative for back pain.  Neurological: Negative for headaches.     Observations/Objective: No SOB or distress noted  Assessment and Plan: 1. Vagina, candidiasis Keep clean and dry Start daily probiotic  Do not scratch Call if symptoms worsen or do not improve  - fluconazole (DIFLUCAN) 150 MG tablet; Take 1 tablet (150 mg total) by mouth every three (3) days as needed.  Dispense: 3 tablet; Refill: 0      I discussed the assessment and treatment plan with the patient. The patient was provided an opportunity to ask questions and all were answered. The patient agreed with the plan and demonstrated an understanding of the instructions.   The patient was advised to call back or seek an in-person evaluation if the symptoms worsen or if the condition fails to improve as anticipated.  The above assessment and management plan was discussed with the patient. The patient verbalized understanding of and has agreed to the management plan. Patient is aware to call the clinic if symptoms persist or worsen. Patient is aware when to return to the clinic for a follow-up visit. Patient educated on when it is appropriate to go to the emergency department.   Time call ended: 9:55 AM   I provided 10 minutes of non-face-to-face time during this encounter.    Evelina Dun, FNP

## 2019-08-22 ENCOUNTER — Ambulatory Visit (INDEPENDENT_AMBULATORY_CARE_PROVIDER_SITE_OTHER): Payer: Self-pay | Admitting: Adult Health

## 2019-08-22 ENCOUNTER — Other Ambulatory Visit: Payer: Self-pay

## 2019-08-22 ENCOUNTER — Encounter: Payer: Self-pay | Admitting: Adult Health

## 2019-08-22 VITALS — BP 112/78 | HR 79 | Ht 65.0 in | Wt 178.5 lb

## 2019-08-22 DIAGNOSIS — N644 Mastodynia: Secondary | ICD-10-CM | POA: Insufficient documentation

## 2019-08-22 DIAGNOSIS — N6322 Unspecified lump in the left breast, upper inner quadrant: Secondary | ICD-10-CM | POA: Insufficient documentation

## 2019-08-22 NOTE — Progress Notes (Signed)
  Subjective:     Patient ID: Michelle Sellers, female   DOB: 10-21-1970, 48 y.o.   MRN: [id number]  HPI Michelle Sellers is a 48 year old female, married, G2P2 in complaining of pain in left breast for about a week.She had normal mammogram 03/31/19 at the [facility], mom and grandma had breast cancer. PCP is Michelle Pretty NP.  Review of Systems Has pain,pulling sensation in left breast for about a week No known injury Feels better when held tight Reviewed past medical,surgical, social and family history. Reviewed medications and allergies.     Objective:   Physical Exam BP 112/78 (BP Location: Left Arm, Patient Position: Sitting, Cuff Size: Normal)   Pulse 79   Ht 5\' 5"  (1.651 m)   Wt 178 lb 8 oz (81 kg)   BMI 29.70 kg/m     Skin warm and dry,  Breasts:no dominate palpable mass, retraction or nipple discharge on the right, on the left, no retraction or nipple discharge, but has fullness/ mass at 11 0' clock that is tender, no skin color or temperature changes.   Assessment:     1. Breast pain, left   2. Mass of upper inner quadrant of left breast       Plan:     Wear sports bra Can take advil  Scheduled left diagnostic mammogram and left breast US for 12/20 at 8 am at the [facility] in February

## 2019-09-04 ENCOUNTER — Telehealth: Payer: Self-pay | Admitting: *Deleted

## 2019-09-04 NOTE — Telephone Encounter (Signed)
Zigmund Daniel with [facility] called and needs diagnostic mammogram order faxed to them due to not being on epic. Fax # is [phone] Phone # [phone] option 3.

## 2019-09-06 DIAGNOSIS — N6489 Other specified disorders of breast: Secondary | ICD-10-CM | POA: Diagnosis not present

## 2019-09-06 DIAGNOSIS — R928 Other abnormal and inconclusive findings on diagnostic imaging of breast: Secondary | ICD-10-CM | POA: Diagnosis not present

## 2019-09-06 DIAGNOSIS — N6322 Unspecified lump in the left breast, upper inner quadrant: Secondary | ICD-10-CM | POA: Diagnosis not present

## 2019-09-14 ENCOUNTER — Other Ambulatory Visit: Payer: Self-pay | Admitting: Nurse Practitioner

## 2019-09-14 DIAGNOSIS — I10 Essential (primary) hypertension: Secondary | ICD-10-CM

## 2019-10-07 ENCOUNTER — Other Ambulatory Visit: Payer: Self-pay | Admitting: Nurse Practitioner

## 2019-10-07 DIAGNOSIS — I10 Essential (primary) hypertension: Secondary | ICD-10-CM

## 2019-10-09 NOTE — Telephone Encounter (Signed)
MMM NTBS 30 days given 09/15/19 

## 2019-10-25 ENCOUNTER — Encounter: Payer: Self-pay | Admitting: Adult Health

## 2019-10-25 ENCOUNTER — Other Ambulatory Visit (HOSPITAL_COMMUNITY)
Admission: RE | Admit: 2019-10-25 | Discharge: 2019-10-25 | Disposition: A | Discharge status: Home / Self Care | Payer: BC Managed Care – PPO | Source: Ambulatory Visit | Attending: Adult Health | Admitting: Adult Health

## 2019-10-25 ENCOUNTER — Other Ambulatory Visit: Payer: Self-pay

## 2019-10-25 ENCOUNTER — Ambulatory Visit (INDEPENDENT_AMBULATORY_CARE_PROVIDER_SITE_OTHER): Payer: BC Managed Care – PPO | Admitting: Adult Health

## 2019-10-25 VITALS — BP 126/87 | HR 60 | Ht 64.0 in | Wt 177.6 lb

## 2019-10-25 DIAGNOSIS — Z01419 Encounter for gynecological examination (general) (routine) without abnormal findings: Secondary | ICD-10-CM

## 2019-10-25 DIAGNOSIS — Z1212 Encounter for screening for malignant neoplasm of rectum: Secondary | ICD-10-CM

## 2019-10-25 DIAGNOSIS — R232 Flushing: Secondary | ICD-10-CM

## 2019-10-25 DIAGNOSIS — F419 Anxiety disorder, unspecified: Secondary | ICD-10-CM

## 2019-10-25 DIAGNOSIS — Z1211 Encounter for screening for malignant neoplasm of colon: Secondary | ICD-10-CM

## 2019-10-25 DIAGNOSIS — Z780 Asymptomatic menopausal state: Secondary | ICD-10-CM | POA: Diagnosis not present

## 2019-10-25 LAB — HEMOCCULT GUIAC POC 1CARD (OFFICE): Fecal Occult Blood, POC: NEGATIVE

## 2019-10-25 MED ORDER — HYDROXYZINE HCL 10 MG PO TABS: 10.0000 mg | ORAL_TABLET | Freq: Three times a day (TID) | ORAL | 3 refills | Status: DC | PRN

## 2019-10-25 NOTE — Progress Notes (Signed)
Patient ID: Michelle Sellers, female   DOB: 17-Jan-1971, 49 y.o.   MRN: [id number] History of Present Illness: Michelle Sellers is a 49 year old white female, married, G2P2 in for a well woman gyn exam and pap. PCP is Bennie Pierini NP Ign[facility] Bayley.   Current Medications, Allergies, Past Medical History, Past Surgical History, Family History and Social History were reviewed in [name].     Review of Systems: Patient denies any headaches, hearing loss, fatigue, blurred vision, shortness of breath, chest pain, abdominal pain, problems with bowel movements, urination, or intercourse. No joint pain.+hot flashes, moody and increased anxiety esp at night. 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     Physical Exam:BP 126/87 (BP Location: Left Arm, Patient Position: Sitting, Cuff Size: Normal)   Pulse 60   Ht 5\' 4"  (1.626 m)   Wt 177 lb 9.6 oz (80.6 kg)   BMI 30.48 kg/m  General:  Well developed, well nourished, no acute distress Skin:  Warm and dry Neck:  Midline trachea, normal thyroid, good ROM, no lymphadenopathy Lungs; Clear to auscultation bilaterally Breast:  No dominant palpable mass, retraction, or nipple discharge Cardiovascular: Regular rate and rhythm Abdomen:  Soft, non tender, no hepatosplenomegaly Pelvic:  External genitalia is normal in appearance, no lesions.  The vagina is normal in appearance. Urethra has no lesions or masses. The cervix is bulbous, pap with high risk HPV 16/18 genotyping performed.  Uterus is felt to be normal size, shape, and contour.  No adnexal masses or tenderness noted.Bladder is non tender, no masses felt. Rectal: Good sphincter tone, no polyps, or hemorrhoids felt.  Hemoccult negative. Extremities/musculoskeletal:  No swelling or varicosities noted, no clubbing or cyanosis Psych:  No  mood changes, alert and cooperative,seems happy Fall risk is low PHQ 2 score is 0. Examination chaperoned by LPN  Impression and Plan: 1. Encounter for gynecological examination with Papanicolaou smear of cervix Pap sent Physical in 1 year Pap in 3 if normal Check CBC,CMP,TSH and lipids Mammogram in July and yearly  2. Screening for colorectal cancer  3. Menopause Discussed HRT, and she declines  4. Hot flashes  5. Anxiety Will rx vistaril to try as needed Meds ordered this encounter  Medications  . hydrOXYzine (ATARAX/VISTARIL) 10 MG tablet    Sig: Take 1 tablet (10 mg total) by mouth 3 (three) times daily as needed for anxiety.    Dispense:  30 tablet    Refill:  3    Order Specific Question:   Supervising Provider    Answer:   August H [[id number]]  Increase walking Take time for self

## 2019-10-26 LAB — COMPREHENSIVE METABOLIC PANEL
ALT: 9 IU/L (ref 0–32)
AST: 13 IU/L (ref 0–40)
Albumin/Globulin Ratio: 1.9 (ref 1.2–2.2)
Albumin: 4.6 g/dL (ref 3.8–4.8)
Alkaline Phosphatase: 61 IU/L (ref 39–117)
BUN/Creatinine Ratio: 19 (ref 9–23)
BUN: 16 mg/dL (ref 6–24)
Bilirubin Total: 0.7 mg/dL (ref 0.0–1.2)
CO2: 24 mmol/L (ref 20–29)
Calcium: 9.8 mg/dL (ref 8.7–10.2)
Chloride: 101 mmol/L (ref 96–106)
Creatinine, Ser: 0.83 mg/dL (ref 0.57–1.00)
GFR calc Af Amer: 95 mL/min/{1.73_m2} (ref >59)
GFR calc non Af Amer: 82 mL/min/{1.73_m2} (ref >59)
Globulin, Total: 2.4 g/dL (ref 1.5–4.5)
Glucose: 97 mg/dL (ref 65–99)
Potassium: 3.6 mmol/L (ref 3.5–5.2)
Sodium: 138 mmol/L (ref 134–144)
Total Protein: 7 g/dL (ref 6.0–8.5)

## 2019-10-26 LAB — CBC
Hematocrit: 38.1 % (ref 34.0–46.6)
Hemoglobin: 13.3 g/dL (ref 11.1–15.9)
MCH: 30.1 pg (ref 26.6–33.0)
MCHC: 34.9 g/dL (ref 31.5–35.7)
MCV: 86 fL (ref 79–97)
Platelets: 216 10*3/uL (ref 150–450)
RBC: 4.42 x10E6/uL (ref 3.77–5.28)
RDW: 12.9 % (ref 11.7–15.4)
WBC: 7.5 10*3/uL (ref 3.4–10.8)

## 2019-10-26 LAB — LIPID PANEL
Chol/HDL Ratio: 4.8 ratio — ABNORMAL HIGH (ref 0.0–4.4)
Cholesterol, Total: 190 mg/dL (ref 100–199)
HDL: 40 mg/dL (ref >39)
LDL Chol Calc (NIH): 122 mg/dL — ABNORMAL HIGH (ref 0–99)
Triglycerides: 155 mg/dL — ABNORMAL HIGH (ref 0–149)
VLDL Cholesterol Cal: 28 mg/dL (ref 5–40)

## 2019-10-26 LAB — TSH: TSH: 1.31 u[IU]/mL (ref 0.450–4.500)

## 2019-10-27 LAB — CYTOLOGY - PAP
Comment: NEGATIVE
Diagnosis: NEGATIVE
High risk HPV: NEGATIVE

## 2019-11-12 ENCOUNTER — Other Ambulatory Visit: Payer: Self-pay | Admitting: Nurse Practitioner

## 2019-11-12 DIAGNOSIS — I10 Essential (primary) hypertension: Secondary | ICD-10-CM

## 2019-11-13 ENCOUNTER — Ambulatory Visit (INDEPENDENT_AMBULATORY_CARE_PROVIDER_SITE_OTHER): Payer: BC Managed Care – PPO | Admitting: Nurse Practitioner

## 2019-11-13 ENCOUNTER — Other Ambulatory Visit: Payer: Self-pay

## 2019-11-13 ENCOUNTER — Encounter: Payer: Self-pay | Admitting: Nurse Practitioner

## 2019-11-13 VITALS — BP 117/81 | HR 61 | Temp 98.4°F | Resp 20 | Ht 64.0 in | Wt 175.0 lb

## 2019-11-13 DIAGNOSIS — E7800 Pure hypercholesterolemia, unspecified: Secondary | ICD-10-CM | POA: Diagnosis not present

## 2019-11-13 DIAGNOSIS — I10 Essential (primary) hypertension: Secondary | ICD-10-CM | POA: Diagnosis not present

## 2019-11-13 DIAGNOSIS — Z6830 Body mass index (BMI) 30.0-30.9, adult: Secondary | ICD-10-CM | POA: Diagnosis not present

## 2019-11-13 MED ORDER — HYDROCHLOROTHIAZIDE 25 MG PO TABS: 25.0000 mg | ORAL_TABLET | Freq: Every day | ORAL | 0 refills | Status: DC

## 2019-11-13 NOTE — Progress Notes (Signed)
Subjective:    Patient ID: Michelle Sellers, female    DOB: 1970-12-27, 49 y.o.   MRN: [id number]   Chief Complaint: Medical Management of Chronic Issues    HPI:  1. Essential hypertension No c/o chest pain, sob or headache. Does check blood pressure at home. When she takes her HCTZ her blood pressure is normal. BP Readings from Last 3 Encounters:  11/13/19 117/81  10/25/19 126/87  08/22/19 112/78     2. Pure hypercholesterolemia Saw GYN and they did blood work and cholesterol was elevated. Lab Results  Component Value Date   CHOL 190 10/25/2019   HDL 40 10/25/2019   LDLCALC 122 (H) 10/25/2019   TRIG 155 (H) 10/25/2019   CHOLHDL 4.8 (H) 10/25/2019     3. BMI 30.0-30.9,adult No recent weight changes Wt Readings from Last 3 Encounters:  11/13/19 175 lb (79.4 kg)  10/25/19 177 lb 9.6 oz (80.6 kg)  08/22/19 178 lb 8 oz (81 kg)   BMI Readings from Last 3 Encounters:  11/13/19 30.04 kg/m  10/25/19 30.48 kg/m  08/22/19 29.70 kg/m       Outpatient Encounter Medications as of 11/13/2019  Medication Sig  . fexofenadine (ALLEGRA) 180 MG tablet Take 180 mg by mouth daily.  . hydrochlorothiazide (HYDRODIURIL) 25 MG tablet Take 1 tablet (25 mg total) by mouth daily. (Needs to be seen before next refill)  . hydrOXYzine (ATARAX/VISTARIL) 10 MG tablet Take 1 tablet (10 mg total) by mouth 3 (three) times daily as needed for anxiety.  . fluticasone (FLONASE) 50 MCG/ACT nasal spray Place 2 sprays into both nostrils daily. (Patient not taking: Reported on 11/13/2019)     Past Surgical History:  Procedure Laterality Date  . DILITATION & CURRETTAGE/HYSTROSCOPY WITH NOVASURE ABLATION N/A 07/21/2016   Procedure: DILATATION & CURETTAGE/HYSTEROSCOPY WITH NOVASURE ENDOMETRIAL ABLATION;  Surgeon: Jonnie Kind, MD;  Location: [facility];  Service: Gynecology;  Laterality: N/A;  . EXTRACORPOREAL SHOCK WAVE LITHOTRIPSY Right 03/01/2017   Procedure: RIGHT EXTRACORPOREAL SHOCK WAVE  LITHOTRIPSY (ESWL);  Surgeon: Festus Aloe, MD;  Location: [facility];  Service: Urology;  Laterality: Right;  . EXTRACORPOREAL SHOCK WAVE LITHOTRIPSY Right 04/05/2017   Procedure: RIGHT EXTRACORPOREAL SHOCK WAVE LITHOTRIPSY (ESWL);  Surgeon: Raynelle Bring, MD;  Location: [facility];  Service: Urology;  Laterality: Right;  . POLYPECTOMY N/A 07/21/2016   Procedure: REMOVAL OF ENDOMETRIAL POLYP;  Surgeon: Jonnie Kind, MD;  Location: [facility];  Service: Gynecology;  Laterality: N/A;  . WISDOM TOOTH EXTRACTION      Family History  Problem Relation Age of Onset  . Hyperlipidemia Mother   . Hypertension Mother   . Cancer Mother        breast  . Breast cancer Mother   . Heart disease Father   . Hyperlipidemia Father   . Hypertension Father   . Hyperlipidemia Maternal Grandmother   . Breast cancer Maternal Grandmother   . Hyperlipidemia Maternal Grandfather   . Hyperlipidemia Paternal Grandmother   . Hyperlipidemia Paternal Grandfather     New complaints: None today  Social history: Lives with husband and children.  Controlled substance contract: n/a    Review of Systems  Constitutional: Negative for diaphoresis.  Eyes: Negative for pain.  Respiratory: Negative for shortness of breath.   Cardiovascular: Negative for chest pain, palpitations and leg swelling.  Gastrointestinal: Negative for abdominal pain.  Endocrine: Negative for polydipsia.  Skin: Negative for rash.  Neurological: Negative for dizziness, weakness and headaches.  Hematological: Does not bruise/bleed easily.  All other systems reviewed and are negative.      Objective:   Physical Exam Vitals and nursing note reviewed.  Constitutional:      General: She is not in acute distress.    Appearance: Normal appearance. She is well-developed.  HENT:     Head: Normocephalic.     Nose: Nose normal.  Eyes:     Pupils: Pupils are equal, round, and reactive to light.  Neck:     Vascular: No carotid bruit or JVD.    Cardiovascular:     Rate and Rhythm: Normal rate and regular rhythm.     Heart sounds: Normal heart sounds.  Pulmonary:     Effort: Pulmonary effort is normal. No respiratory distress.     Breath sounds: Normal breath sounds. No wheezing or rales.  Chest:     Chest wall: No tenderness.  Abdominal:     General: Bowel sounds are normal. There is no distension or abdominal bruit.     Palpations: Abdomen is soft. There is no hepatomegaly, splenomegaly, mass or pulsatile mass.     Tenderness: There is no abdominal tenderness.  Musculoskeletal:        General: Normal range of motion.     Cervical back: Normal range of motion and neck supple.  Lymphadenopathy:     Cervical: No cervical adenopathy.  Skin:    General: Skin is warm and dry.  Neurological:     Mental Status: She is alert and oriented to person, place, and time.     Deep Tendon Reflexes: Reflexes are normal and symmetric.  Psychiatric:        Behavior: Behavior normal.        Thought Content: Thought content normal.        Judgment: Judgment normal.    BP 117/81   Pulse 61   Temp 98.4 F (36.9 C) (Temporal)   Resp 20   Ht 5\' 4"  (1.626 m)   Wt 175 lb (79.4 kg)   SpO2 95%   BMI 30.04 kg/m         Assessment & Plan:  Michelle Sellers comes in today with chief complaint of Medical Management of Chronic Issues   Diagnosis and orders addressed:  1. Essential hypertension Low sodium diet - hydrochlorothiazide (HYDRODIURIL) 25 MG tablet; Take 1 tablet (25 mg total) by mouth daily. (Needs to be seen before next refill)  Dispense: 30 tablet; Refill: 0  2. Pure hypercholesterolemia Low fat diet Red yeast rice OTC  3. BMI 30.0-30.9,adult Discussed diet and exercise for person with BMI >25 Will recheck weight in 3-6 months    Labs pending Health Maintenance reviewed Diet and exercise encouraged  Follow up plan: 6 months   Mary-Margaret 03-07-1997, FNP

## 2019-11-13 NOTE — Telephone Encounter (Signed)
Patient has appt today with MMM

## 2019-11-13 NOTE — Telephone Encounter (Signed)
MMM NTBS 30 days given 09/15/19 

## 2019-11-13 NOTE — Patient Instructions (Signed)

## 2019-12-06 ENCOUNTER — Other Ambulatory Visit: Payer: Self-pay | Admitting: Nurse Practitioner

## 2019-12-06 DIAGNOSIS — I10 Essential (primary) hypertension: Secondary | ICD-10-CM

## 2020-02-29 ENCOUNTER — Other Ambulatory Visit: Payer: Self-pay | Admitting: Nurse Practitioner

## 2020-02-29 DIAGNOSIS — I10 Essential (primary) hypertension: Secondary | ICD-10-CM

## 2020-05-02 ENCOUNTER — Other Ambulatory Visit: Payer: Self-pay

## 2020-05-02 ENCOUNTER — Telehealth (INDEPENDENT_AMBULATORY_CARE_PROVIDER_SITE_OTHER): Payer: BC Managed Care – PPO | Admitting: Nurse Practitioner

## 2020-05-02 DIAGNOSIS — Z8616 Personal history of COVID-19: Secondary | ICD-10-CM | POA: Diagnosis not present

## 2020-05-02 NOTE — Progress Notes (Signed)
   Virtual Visit via video Note   Due to COVID-19 pandemic this visit was conducted virtually. This visit type was conducted due to national recommendations for restrictions regarding the COVID-19 Pandemic (e.g. social distancing, sheltering in place) in an effort to limit this patient's exposure and mitigate transmission in our community. All issues noted in this document were discussed and addressed.  A physical exam was not performed with this format.  I connected with  Michelle Sellers  on 05/02/20 at 11:10 by video and verified that I am speaking with the correct person using two identifiers. Michelle Sellers is currently located at home and no one is currently with  her during visit. The provider, Mary-Margaret Daphine Deutscher, FNP is located in their office at time of visit.  I discussed the limitations, risks, security and privacy concerns of performing an evaluation and management service by video  and the availability of in person appointments. I also discussed with the patient that there may be a patient responsible charge related to this service. The patient expressed understanding and agreed to proceed.   History and Present Illness:   Chief Complaint: Covid Exposure   HPI Patient went to [place] and caught covid there. She had to stay and extra week before she can come home. She is now negative. She says she had a lot of trouble breathing for several days. She came home on 04/30/20. She is better. Still has cough and some fatigue but is better. No fever. Still has not gotten taste and smell back.     Review of Systems  Constitutional: Negative for chills and fever.  HENT: Negative.   Respiratory: Positive for cough. Negative for shortness of breath.   Musculoskeletal: Negative.   Neurological: Negative.   Psychiatric/Behavioral: Negative.   All other systems reviewed and are negative.      Observations/Objective: Alert and oriented- answers all questions appropriately No  distress No cough during visit   Assessment and Plan: Michelle Sellers in today with chief complaint of Covid Exposure   1. Personal history of covid-19 Continue to treat cough Force fluids Recheck in 2 weeks     Follow Up Instructions: 2 weeks    I discussed the assessment and treatment plan with the patient. The patient was provided an opportunity to ask questions and all were answered. The patient agreed with the plan and demonstrated an understanding of the instructions.   The patient was advised to call back or seek an in-person evaluation if the symptoms worsen or if the condition fails to improve as anticipated.  The above assessment and management plan was discussed with the patient. The patient verbalized understanding of and has agreed to the management plan. Patient is aware to call the clinic if symptoms persist or worsen. Patient is aware when to return to the clinic for a follow-up visit. Patient educated on when it is appropriate to go to the emergency department.   Time call ended:11:25  I provided 15 minutes of face-to-face time during this encounter.    Mary-Margaret Daphine Deutscher, FNP

## 2020-05-16 ENCOUNTER — Encounter: Payer: Self-pay | Admitting: Nurse Practitioner

## 2020-05-16 ENCOUNTER — Ambulatory Visit (INDEPENDENT_AMBULATORY_CARE_PROVIDER_SITE_OTHER): Payer: BC Managed Care – PPO | Admitting: Nurse Practitioner

## 2020-05-16 ENCOUNTER — Other Ambulatory Visit: Payer: Self-pay

## 2020-05-16 VITALS — BP 114/86 | HR 62 | Temp 98.4°F | Resp 20 | Ht 64.0 in | Wt 168.0 lb

## 2020-05-16 DIAGNOSIS — I10 Essential (primary) hypertension: Secondary | ICD-10-CM | POA: Diagnosis not present

## 2020-05-16 DIAGNOSIS — F411 Generalized anxiety disorder: Secondary | ICD-10-CM

## 2020-05-16 DIAGNOSIS — Z6830 Body mass index (BMI) 30.0-30.9, adult: Secondary | ICD-10-CM

## 2020-05-16 DIAGNOSIS — E7800 Pure hypercholesterolemia, unspecified: Secondary | ICD-10-CM | POA: Diagnosis not present

## 2020-05-16 MED ORDER — ESCITALOPRAM OXALATE 10 MG PO TABS: 10.0000 mg | ORAL_TABLET | Freq: Every day | ORAL | 1 refills | Status: DC

## 2020-05-16 MED ORDER — HYDROCHLOROTHIAZIDE 25 MG PO TABS: 25.0000 mg | ORAL_TABLET | Freq: Every day | ORAL | 1 refills | Status: DC

## 2020-05-16 NOTE — Progress Notes (Signed)
Subjective:    Patient ID: Michelle Sellers, female    DOB: 29-Dec-1970, 49 y.o.   MRN: [id number]   Chief Complaint: Medical Management of Chronic Issues    HPI:  1. Essential hypertension BP Readings from Last 3 Encounters:  05/16/20 114/86  11/13/19 117/81  10/25/19 126/87  Does not check BP at home, takes medication as prescribed. Denies chest pain, SOB, HA, or any related complaints.     2. Pure hypercholesterolemia Lab Results  Component Value Date   CHOL 190 10/25/2019   HDL 40 10/25/2019   LDLCALC 122 (H) 10/25/2019   TRIG 155 (H) 10/25/2019   CHOLHDL 4.8 (H) 10/25/2019  Does not take medication. Avoids high fat and friend foods.  The 10-year ASCVD risk score Denman George DC Montez Hageman., et al., 2013) is: 1.8%   Values used to calculate the score:     Age: 26 years     Sex: Female     Is Non-Hispanic African American: No     Diabetic: No     Tobacco smoker: No     Systolic Blood Pressure: 114 mmHg     Is BP treated: Yes     HDL Cholesterol: 40 mg/dL     Total Cholesterol: 190 mg/dL   3. BMI 30.0-30.9,adult Wt Readings from Last 3 Encounters:  05/16/20 168 lb (76.2 kg)  11/13/19 175 lb (79.4 kg)  10/25/19 177 lb 9.6 oz (80.6 kg)   BMI Readings from Last 3 Encounters:  05/16/20 28.84 kg/m  11/13/19 30.04 kg/m  10/25/19 30.48 kg/m   Walks most days. Watches what she eats. Stays active.   4. GAD (generalized anxiety disorder) GAD 7 : Generalized Anxiety Score 05/16/2020 08/06/2015  Nervous, Anxious, on Edge 3 3  Control/stop worrying 3 3  Worry too much - different things 3 3  Trouble relaxing 0 3  Restless 1 2  Easily annoyed or irritable 0 2  Afraid - awful might happen 0 0  Total GAD 7 Score 10 16  Anxiety Difficulty - Very difficult     Increased anxiety which she believes is due to menopause. Stressed due to recent COVID-19 infection and stuck traveling. States things are getting better now that she is back [place] the states.      Outpatient  Encounter Medications as of 05/16/2020  Medication Sig  . fexofenadine (ALLEGRA) 180 MG tablet Take 180 mg by mouth daily.  . fluticasone (FLONASE) 50 MCG/ACT nasal spray Place 2 sprays into both nostrils daily.  . hydrochlorothiazide (HYDRODIURIL) 25 MG tablet TAKE 1 TABLET BY MOUTH EVERY DAY  . [DISCONTINUED] hydrOXYzine (ATARAX/VISTARIL) 10 MG tablet Take 1 tablet (10 mg total) by mouth 3 (three) times daily as needed for anxiety.   No facility-administered encounter medications on file as of 05/16/2020.    Past Surgical History:  Procedure Laterality Date  . DILITATION & CURRETTAGE/HYSTROSCOPY WITH NOVASURE ABLATION N/A 07/21/2016   Procedure: DILATATION & CURETTAGE/HYSTEROSCOPY WITH NOVASURE ENDOMETRIAL ABLATION;  Surgeon: [name], MD;  Location: [facility];  Service: Gynecology;  Laterality: N/A;  . EXTRACORPOREAL SHOCK WAVE LITHOTRIPSY Right 03/01/2017   Procedure: RIGHT EXTRACORPOREAL SHOCK WAVE LITHOTRIPSY (ESWL);  Surgeon: Jerilee Field, MD;  Location: [facility];  Service: Urology;  Laterality: Right;  . EXTRACORPOREAL SHOCK WAVE LITHOTRIPSY Right 04/05/2017   Procedure: RIGHT EXTRACORPOREAL SHOCK WAVE LITHOTRIPSY (ESWL);  Surgeon: Heloise Purpura, MD;  Location: [facility];  Service: Urology;  Laterality: Right;  . POLYPECTOMY N/A 07/21/2016   Procedure: REMOVAL OF ENDOMETRIAL POLYP;  Surgeon: [name], MD;  Location: [facility];  Service: Gynecology;  Laterality: N/A;  . WISDOM TOOTH EXTRACTION      Family History  Problem Relation Age of Onset  . Hyperlipidemia Mother   . Hypertension Mother   . Cancer Mother        breast  . Breast cancer Mother   . Heart disease Father   . Hyperlipidemia Father   . Hypertension Father   . Hyperlipidemia Maternal Grandmother   . Breast cancer Maternal Grandmother   . Hyperlipidemia Maternal Grandfather   . Hyperlipidemia Paternal Grandmother   . Hyperlipidemia Paternal Grandfather     New complaints: Increased anxiety, would like  to try something to help her relax and help her sleep.   Social history: Lives at home with husband and two sons.   Controlled substance contract: n/a    Review of Systems  Constitutional: Negative.   HENT: Negative.   Eyes: Negative.   Respiratory: Negative.   Cardiovascular: Negative.   Gastrointestinal: Negative.   Endocrine: Negative.   Genitourinary: Negative.   Musculoskeletal: Negative.   Skin: Negative.   Allergic/Immunologic: Negative.   Neurological: Negative.   Hematological: Negative.   Psychiatric/Behavioral: Negative.   All other systems reviewed and are negative.      Objective:   Physical Exam Vitals and nursing note reviewed.  Constitutional:      Appearance: Normal appearance.  HENT:     Head: Normocephalic and atraumatic.     Right Ear: Tympanic membrane, ear canal and external ear normal.     Left Ear: Tympanic membrane, ear canal and external ear normal.     Nose: Nose normal.     Mouth/Throat:     Mouth: Mucous membranes are moist.     Pharynx: Oropharynx is clear.  Eyes:     Extraocular Movements: Extraocular movements intact.     Conjunctiva/sclera: Conjunctivae normal.     Pupils: Pupils are equal, round, and reactive to light.  Cardiovascular:     Rate and Rhythm: Normal rate and regular rhythm.     Pulses: Normal pulses.     Heart sounds: Normal heart sounds.  Pulmonary:     Effort: Pulmonary effort is normal.     Breath sounds: Normal breath sounds.  Abdominal:     General: Abdomen is flat.     Palpations: Abdomen is soft.  Musculoskeletal:        General: Normal range of motion.     Cervical back: Normal range of motion.  Skin:    General: Skin is warm and dry.     Capillary Refill: Capillary refill takes less than 2 seconds.  Neurological:     General: No focal deficit present.     Mental Status: She is alert and oriented to person, place, and time. Mental status is at baseline.  Psychiatric:        Mood and Affect: Mood  normal.        Behavior: Behavior normal.        Thought Content: Thought content normal.        Judgment: Judgment normal.    BP 114/86   Pulse 62   Temp 98.4 F (36.9 C) (Temporal)   Resp 20   Ht 5\' 4"  (1.626 m)   Wt 168 lb (76.2 kg)   SpO2 96%   BMI 28.84 kg/m       Assessment & Plan:.  Tiffanie Warda comes in today with chief complaint of Medical Management  of Chronic Issues   Diagnosis and orders addressed:  1. Essential hypertension Take medication as prescribed, avoid foods that are high in salt. Stay active.   2. Pure hypercholesterolemia Avoid foods that are fried or high in fat.   3. BMI 30.0-30.9,adult Stay active and eat a well balanced, heart healthy diet that is low in salt.   4. GAD (generalized anxiety disorder) Begin taking escitalopram (Lexapro) 10mg  (1 tab) daily.  Report any side effects of increased depression or thoughts of suicide.    Labs pending Health Maintenance reviewed Diet and exercise encouraged  Follow up plan: 6 months   Mary-Margaret , FNP

## 2020-05-16 NOTE — Patient Instructions (Signed)
Exercising to Stay Healthy To become healthy and stay healthy, it is recommended that you do moderate-intensity and vigorous-intensity exercise. You can tell that you are exercising at a moderate intensity if your heart starts beating faster and you start breathing faster but can still hold a conversation. You can tell that you are exercising at a vigorous intensity if you are breathing much harder and faster and cannot hold a conversation while exercising. Exercising regularly is important. It has many health benefits, such as:  Improving overall fitness, flexibility, and endurance.  Increasing bone density.  Helping with weight control.  Decreasing body fat.  Increasing muscle strength.  Reducing stress and tension.  Improving overall health. How often should I exercise? Choose an activity that you enjoy, and set realistic goals. Your health care provider can help you make an activity plan that works for you. Exercise regularly as told by your health care provider. This may include:  Doing strength training two times a week, such as: ? Lifting weights. ? Using resistance bands. ? Push-ups. ? Sit-ups. ? Yoga.  Doing a certain intensity of exercise for a given amount of time. Choose from these options: ? A total of 150 minutes of moderate-intensity exercise every week. ? A total of 75 minutes of vigorous-intensity exercise every week. ? A mix of moderate-intensity and vigorous-intensity exercise every week. Children, pregnant women, people who have not exercised regularly, people who are overweight, and older adults may need to talk with a health care provider about what activities are safe to do. If you have a medical condition, be sure to talk with your health care provider before you start a new exercise program. What are some exercise ideas? Moderate-intensity exercise ideas include:  Walking 1 mile (1.6 km) in about 15  minutes.  Biking.  Hiking.  Golfing.  Dancing.  Water aerobics. Vigorous-intensity exercise ideas include:  Walking 4.5 miles (7.2 km) or more in about 1 hour.  Jogging or running 5 miles (8 km) in about 1 hour.  Biking 10 miles (16.1 km) or more in about 1 hour.  Lap swimming.  Roller-skating or in-line skating.  Cross-country skiing.  Vigorous competitive sports, such as football, basketball, and soccer.  Jumping rope.  Aerobic dancing. What are some everyday activities that can help me to get exercise?  Yard work, such as: ? Pushing a lawn mower. ? Raking and bagging leaves.  Washing your car.  Pushing a stroller.  Shoveling snow.  Gardening.  Washing windows or floors. How can I be more active in my day-to-day activities?  Use stairs instead of an elevator.  Take a walk during your lunch break.  If you drive, park your car farther away from your work or school.  If you take public transportation, get off one stop early and walk the rest of the way.  Stand up or walk around during all of your indoor phone calls.  Get up, stretch, and walk around every 30 minutes throughout the day.  Enjoy exercise with a friend. Support to continue exercising will help you keep a regular routine of activity. What guidelines can I follow while exercising?  Before you start a new exercise program, talk with your health care provider.  Do not exercise so much that you hurt yourself, feel dizzy, or get very short of breath.  Wear comfortable clothes and wear shoes with good support.  Drink plenty of water while you exercise to prevent dehydration or heat stroke.  Work out until your breathing   and your heartbeat get faster. Where to find more information  [place] Department of Health and Human Services: www.hhs.gov  Centers for Disease Control and Prevention (CDC): www.cdc.gov Summary  Exercising regularly is important. It will improve your overall fitness,  flexibility, and endurance.  Regular exercise also will improve your overall health. It can help you control your weight, reduce stress, and improve your bone density.  Do not exercise so much that you hurt yourself, feel dizzy, or get very short of breath.  Before you start a new exercise program, talk with your health care provider. This information is not intended to replace advice given to you by your health care provider. Make sure you discuss any questions you have with your health care provider. Document Revised: 08/06/2017 Document Reviewed: 07/15/2017 Elsevier Patient Education  2020 Elsevier Inc.  

## 2020-05-17 ENCOUNTER — Ambulatory Visit: Payer: Self-pay | Admitting: Nurse Practitioner

## 2020-05-17 LAB — CBC WITH DIFFERENTIAL/PLATELET
Basophils Absolute: 0.1 10*3/uL (ref 0.0–0.2)
Basos: 1 %
EOS (ABSOLUTE): 0.1 10*3/uL (ref 0.0–0.4)
Eos: 2 %
Hematocrit: 38.8 % (ref 34.0–46.6)
Hemoglobin: 12.7 g/dL (ref 11.1–15.9)
Immature Grans (Abs): 0 10*3/uL (ref 0.0–0.1)
Immature Granulocytes: 0 %
Lymphocytes Absolute: 1.8 10*3/uL (ref 0.7–3.1)
Lymphs: 30 %
MCH: 29.2 pg (ref 26.6–33.0)
MCHC: 32.7 g/dL (ref 31.5–35.7)
MCV: 89 fL (ref 79–97)
Monocytes Absolute: 0.6 10*3/uL (ref 0.1–0.9)
Monocytes: 9 %
Neutrophils Absolute: 3.6 10*3/uL (ref 1.4–7.0)
Neutrophils: 58 %
Platelets: 204 10*3/uL (ref 150–450)
RBC: 4.35 x10E6/uL (ref 3.77–5.28)
RDW: 13 % (ref 11.7–15.4)
WBC: 6.2 10*3/uL (ref 3.4–10.8)

## 2020-05-17 LAB — CMP14+EGFR
ALT: 13 IU/L (ref 0–32)
AST: 14 IU/L (ref 0–40)
Albumin/Globulin Ratio: 2 (ref 1.2–2.2)
Albumin: 4.5 g/dL (ref 3.8–4.8)
Alkaline Phosphatase: 64 IU/L (ref 48–121)
BUN/Creatinine Ratio: 22 (ref 9–23)
BUN: 17 mg/dL (ref 6–24)
Bilirubin Total: 0.6 mg/dL (ref 0.0–1.2)
CO2: 26 mmol/L (ref 20–29)
Calcium: 9.8 mg/dL (ref 8.7–10.2)
Chloride: 102 mmol/L (ref 96–106)
Creatinine, Ser: 0.78 mg/dL (ref 0.57–1.00)
GFR calc Af Amer: 103 mL/min/{1.73_m2} (ref >59)
GFR calc non Af Amer: 89 mL/min/{1.73_m2} (ref >59)
Globulin, Total: 2.3 g/dL (ref 1.5–4.5)
Glucose: 89 mg/dL (ref 65–99)
Potassium: 3.5 mmol/L (ref 3.5–5.2)
Sodium: 141 mmol/L (ref 134–144)
Total Protein: 6.8 g/dL (ref 6.0–8.5)

## 2020-05-17 LAB — LIPID PANEL
Chol/HDL Ratio: 5.4 ratio — ABNORMAL HIGH (ref 0.0–4.4)
Cholesterol, Total: 188 mg/dL (ref 100–199)
HDL: 35 mg/dL — ABNORMAL LOW (ref >39)
LDL Chol Calc (NIH): 125 mg/dL — ABNORMAL HIGH (ref 0–99)
Triglycerides: 153 mg/dL — ABNORMAL HIGH (ref 0–149)
VLDL Cholesterol Cal: 28 mg/dL (ref 5–40)

## 2020-05-20 ENCOUNTER — Telehealth: Payer: Self-pay | Admitting: Nurse Practitioner

## 2020-05-20 NOTE — Telephone Encounter (Signed)
Patient aware and verbalized understanding. °

## 2020-09-11 ENCOUNTER — Ambulatory Visit: Payer: BC Managed Care – PPO | Admitting: Family Medicine

## 2020-09-12 ENCOUNTER — Ambulatory Visit (INDEPENDENT_AMBULATORY_CARE_PROVIDER_SITE_OTHER): Payer: BC Managed Care – PPO

## 2020-09-12 ENCOUNTER — Encounter: Payer: Self-pay | Admitting: Nurse Practitioner

## 2020-09-12 ENCOUNTER — Ambulatory Visit (INDEPENDENT_AMBULATORY_CARE_PROVIDER_SITE_OTHER): Payer: BC Managed Care – PPO | Admitting: Nurse Practitioner

## 2020-09-12 ENCOUNTER — Other Ambulatory Visit: Payer: Self-pay

## 2020-09-12 VITALS — BP 127/88 | HR 59 | Temp 98.2°F | Resp 20 | Ht 64.0 in | Wt 169.0 lb

## 2020-09-12 DIAGNOSIS — M79644 Pain in right finger(s): Secondary | ICD-10-CM

## 2020-09-12 DIAGNOSIS — M7989 Other specified soft tissue disorders: Secondary | ICD-10-CM | POA: Diagnosis not present

## 2020-09-12 NOTE — Patient Instructions (Signed)
Ganglion Cyst  A ganglion cyst is a non-cancerous, fluid-filled lump that occurs near a joint or tendon. The cyst grows out of a joint or the lining of a tendon. Ganglion cysts most often develop in the hand or wrist, but they can also develop in the shoulder, elbow, hip, knee, ankle, or foot. Ganglion cysts are ball-shaped or egg-shaped. Their size can range from the size of a pea to larger than a grape. Increased activity may cause the cyst to get bigger because more fluid starts to build up. What are the causes? The exact cause of this condition is not known, but it may be related to:  Inflammation or irritation around the joint.  An injury.  Repetitive movements or overuse.  Arthritis. What increases the risk? You are more likely to develop this condition if:  You are a woman.  You are 15-40 years old. What are the signs or symptoms? The main symptom of this condition is a lump. It most often appears on the hand or wrist. In many cases, there are no other symptoms, but a cyst can sometimes cause:  Tingling.  Pain.  Numbness.  Muscle weakness.  Weak grip.  Less range of motion in a joint. How is this diagnosed? Ganglion cysts are usually diagnosed based on a physical exam. Your health care provider will feel the lump and may shine a light next to it. If it is a ganglion cyst, the light will likely shine through it. Your health care provider may order an X-ray, ultrasound, or MRI to rule out other conditions. How is this treated? Ganglion cysts often go away on their own without treatment. If you have pain or other symptoms, treatment may be needed. Treatment is also needed if the ganglion cyst limits your movement or if it gets infected. Treatment may include:  Wearing a brace or splint on your wrist or finger.  Taking anti-inflammatory medicine.  Having fluid drained from the lump with a needle (aspiration).  Getting a steroid injected into the joint.  Having  surgery to remove the ganglion cyst.  Placing a pad on your shoe or wearing shoes that will not rub against the cyst if it is on your foot. Follow these instructions at home:  Do not press on the ganglion cyst, poke it with a needle, or hit it.  Take over-the-counter and prescription medicines only as told by your health care provider.  If you have a brace or splint: ? Wear it as told by your health care provider. ? Remove it as told by your health care provider. Ask if you need to remove it when you take a shower or a bath.  Watch your ganglion cyst for any changes.  Keep all follow-up visits as told by your health care provider. This is important. Contact a health care provider if:  Your ganglion cyst becomes larger or more painful.  You have pus coming from the lump.  You have weakness or numbness in the affected area.  You have a fever or chills. Get help right away if:  You have a fever and have any of these in the cyst area: ? Increased redness. ? Red streaks. ? Swelling. Summary  A ganglion cyst is a non-cancerous, fluid-filled lump that occurs near a joint or tendon.  Ganglion cysts most often develop in the hand or wrist, but they can also develop in the shoulder, elbow, hip, knee, ankle, or foot.  Ganglion cysts often go away on their own without treatment.   This information is not intended to replace advice given to you by your health care provider. Make sure you discuss any questions you have with your health care provider. Document Revised: 08/06/2017 Document Reviewed: 04/23/2017 Elsevier Patient Education  2020 Elsevier Inc.  

## 2020-09-12 NOTE — Progress Notes (Signed)
   Subjective:    Patient ID: Michelle Sellers, female    DOB: 1970/11/30, 50 y.o.   MRN: [id number]   Chief Complaint: Swollen right index finger   HPI Patient come sin today c/o finger pain. She says she hit on something at work 2 months ago and it has been sore every since.   Review of Systems  Constitutional: Negative for diaphoresis.  Eyes: Negative for pain.  Respiratory: Negative for shortness of breath.   Cardiovascular: Negative for chest pain, palpitations and leg swelling.  Gastrointestinal: Negative for abdominal pain.  Endocrine: Negative for polydipsia.  Skin: Negative for rash.  Neurological: Negative for dizziness, weakness and headaches.  Hematological: Does not bruise/bleed easily.  All other systems reviewed and are negative.      Objective:   Physical Exam Vitals and nursing note reviewed.  Constitutional:      Appearance: Normal appearance.  Cardiovascular:     Rate and Rhythm: Normal rate and regular rhythm.     Heart sounds: Normal heart sounds.  Pulmonary:     Breath sounds: Normal breath sounds.  Musculoskeletal:     Comments: Erythematous tender raised area on thimb side of right index finger.  Skin:    General: Skin is warm.  Neurological:     General: No focal deficit present.     Mental Status: She is alert.  Psychiatric:        Mood and Affect: Mood normal.        Behavior: Behavior normal.    BP 127/88   Pulse (!) 59   Temp 98.2 F (36.8 C) (Temporal)   Resp 20   Ht 5\' 4"  (1.626 m)   Wt 169 lb (76.7 kg)   SpO2 98%   BMI 29.01 kg/m   Rt index finger xray- normal-Preliminary reading by , FNP  [facility]     Assessment & Plan:  [facility] in today with chief complaint of Swollen right index finger   1. Finger pain, right Probable cyst - DG Finger Index Right - Ambulatory referral to Orthopedic Surgery    The above assessment and management plan was discussed with the patient. The patient verbalized  understanding of and has agreed to the management plan. Patient is aware to call the clinic if symptoms persist or worsen. Patient is aware when to return to the clinic for a follow-up visit. Patient educated on when it is appropriate to go to the emergency department.   Michelle Michelle Stalker, FNP

## 2020-10-02 DIAGNOSIS — R2231 Localized swelling, mass and lump, right upper limb: Secondary | ICD-10-CM | POA: Diagnosis not present

## 2020-11-05 ENCOUNTER — Other Ambulatory Visit: Payer: Self-pay | Admitting: Orthopaedic Surgery

## 2020-11-05 DIAGNOSIS — R2231 Localized swelling, mass and lump, right upper limb: Secondary | ICD-10-CM | POA: Diagnosis not present

## 2020-11-14 ENCOUNTER — Encounter: Payer: Self-pay | Admitting: Nurse Practitioner

## 2020-11-14 ENCOUNTER — Ambulatory Visit: Payer: Self-pay | Admitting: Nurse Practitioner

## 2020-11-14 NOTE — Progress Notes (Deleted)
Subjective

## 2020-12-06 ENCOUNTER — Ambulatory Visit (INDEPENDENT_AMBULATORY_CARE_PROVIDER_SITE_OTHER): Payer: BC Managed Care – PPO | Admitting: Nurse Practitioner

## 2020-12-06 ENCOUNTER — Encounter: Payer: Self-pay | Admitting: Nurse Practitioner

## 2020-12-06 ENCOUNTER — Other Ambulatory Visit: Payer: Self-pay

## 2020-12-06 VITALS — BP 113/83 | HR 61 | Temp 97.6°F | Resp 20 | Ht 64.0 in | Wt 171.0 lb

## 2020-12-06 DIAGNOSIS — R300 Dysuria: Secondary | ICD-10-CM | POA: Diagnosis not present

## 2020-12-06 DIAGNOSIS — F411 Generalized anxiety disorder: Secondary | ICD-10-CM

## 2020-12-06 DIAGNOSIS — E7800 Pure hypercholesterolemia, unspecified: Secondary | ICD-10-CM | POA: Diagnosis not present

## 2020-12-06 DIAGNOSIS — B373 Candidiasis of vulva and vagina: Secondary | ICD-10-CM

## 2020-12-06 DIAGNOSIS — B3731 Acute candidiasis of vulva and vagina: Secondary | ICD-10-CM

## 2020-12-06 DIAGNOSIS — I10 Essential (primary) hypertension: Secondary | ICD-10-CM | POA: Diagnosis not present

## 2020-12-06 DIAGNOSIS — Z6830 Body mass index (BMI) 30.0-30.9, adult: Secondary | ICD-10-CM | POA: Diagnosis not present

## 2020-12-06 LAB — URINALYSIS, COMPLETE
Bilirubin, UA: NEGATIVE
Glucose, UA: NEGATIVE
Ketones, UA: NEGATIVE
Leukocytes,UA: NEGATIVE
Nitrite, UA: NEGATIVE
Protein,UA: NEGATIVE
RBC, UA: NEGATIVE
Specific Gravity, UA: 1.02 (ref 1.005–1.030)
Urobilinogen, Ur: 0.2 mg/dL (ref 0.2–1.0)
pH, UA: 7 (ref 5.0–7.5)

## 2020-12-06 LAB — MICROSCOPIC EXAMINATION: WBC, UA: NONE SEEN /hpf (ref 0–5)

## 2020-12-06 LAB — CBC WITH DIFFERENTIAL/PLATELET
Basophils Absolute: 0.1 10*3/uL (ref 0.0–0.2)
Basos: 1 %
EOS (ABSOLUTE): 0.1 10*3/uL (ref 0.0–0.4)
Eos: 1 %
Hematocrit: 36.5 % (ref 34.0–46.6)
Hemoglobin: 12.3 g/dL (ref 11.1–15.9)
Immature Grans (Abs): 0 10*3/uL (ref 0.0–0.1)
Immature Granulocytes: 0 %
Lymphocytes Absolute: 2.4 10*3/uL (ref 0.7–3.1)
Lymphs: 37 %
MCH: 29.9 pg (ref 26.6–33.0)
MCHC: 33.7 g/dL (ref 31.5–35.7)
MCV: 89 fL (ref 79–97)
Monocytes Absolute: 0.5 10*3/uL (ref 0.1–0.9)
Monocytes: 7 %
Neutrophils Absolute: 3.5 10*3/uL (ref 1.4–7.0)
Neutrophils: 54 %
Platelets: 165 10*3/uL (ref 150–450)
RBC: 4.12 x10E6/uL (ref 3.77–5.28)
RDW: 13.2 % (ref 11.7–15.4)
WBC: 6.6 10*3/uL (ref 3.4–10.8)

## 2020-12-06 LAB — CMP14+EGFR
ALT: 11 IU/L (ref 0–32)
AST: 14 IU/L (ref 0–40)
Albumin/Globulin Ratio: 2 (ref 1.2–2.2)
Albumin: 4.2 g/dL (ref 3.8–4.9)
Alkaline Phosphatase: 64 IU/L (ref 44–121)
BUN/Creatinine Ratio: 29 — ABNORMAL HIGH (ref 9–23)
BUN: 21 mg/dL (ref 6–24)
Bilirubin Total: 0.6 mg/dL (ref 0.0–1.2)
CO2: 23 mmol/L (ref 20–29)
Calcium: 9.1 mg/dL (ref 8.7–10.2)
Chloride: 103 mmol/L (ref 96–106)
Creatinine, Ser: 0.73 mg/dL (ref 0.57–1.00)
Globulin, Total: 2.1 g/dL (ref 1.5–4.5)
Glucose: 90 mg/dL (ref 65–99)
Potassium: 3.9 mmol/L (ref 3.5–5.2)
Sodium: 141 mmol/L (ref 134–144)
Total Protein: 6.3 g/dL (ref 6.0–8.5)
eGFR: 100 mL/min/{1.73_m2} (ref >59)

## 2020-12-06 LAB — LIPID PANEL
Chol/HDL Ratio: 4.5 ratio — ABNORMAL HIGH (ref 0.0–4.4)
Cholesterol, Total: 179 mg/dL (ref 100–199)
HDL: 40 mg/dL (ref >39)
LDL Chol Calc (NIH): 118 mg/dL — ABNORMAL HIGH (ref 0–99)
Triglycerides: 113 mg/dL (ref 0–149)
VLDL Cholesterol Cal: 21 mg/dL (ref 5–40)

## 2020-12-06 MED ORDER — HYDROCHLOROTHIAZIDE 25 MG PO TABS: 25.0000 mg | ORAL_TABLET | Freq: Every day | ORAL | 1 refills | Status: DC

## 2020-12-06 MED ORDER — FLUCONAZOLE 150 MG PO TABS: ORAL_TABLET | ORAL | 0 refills | Status: DC

## 2020-12-06 NOTE — Patient Instructions (Signed)

## 2020-12-06 NOTE — Progress Notes (Signed)
Subjective:    Patient ID: Michelle Sellers, female    DOB: 03/31/1971, 50 y.o.   MRN: [id number]   Chief Complaint: Chronic disease management.    HPI:  1. Primary hypertension Taking HCTZ qd. Doing well with this. No HA, dizziness, vision changes.   BP Readings from Last 3 Encounters:  12/06/20 113/83  09/12/20 127/88  05/16/20 114/86    2. Pure hypercholesterolemia Control with diet and exercise.   Lab Results  Component Value Date   CHOL 188 05/16/2020   HDL 35 (L) 05/16/2020   LDLCALC 125 (H) 05/16/2020   TRIG 153 (H) 05/16/2020   CHOLHDL 5.4 (H) 05/16/2020  \The 10-year ASCVD risk score Mikey Bussing DC Jr., et al., 2013) is: 2.3%   Values used to calculate the score:     Age: 32 years     Sex: Female     Is Non-Hispanic African American: No     Diabetic: No     Tobacco smoker: No     Systolic Blood Pressure: 675 mmHg     Is BP treated: Yes     HDL Cholesterol: 35 mg/dL     Total Cholesterol: 188 mg/dL   3. GAD (generalized anxiety disorder) Managing well on her own. Does not take anything to help manage.    4. BMI 30.0-30.9,adult Walks every day.   Wt Readings from Last 3 Encounters:  12/06/20 171 lb (77.6 kg)  09/12/20 169 lb (76.7 kg)  05/16/20 168 lb (76.2 kg)       Outpatient Encounter Medications as of 12/06/2020  Medication Sig  . fexofenadine (ALLEGRA) 180 MG tablet Take 180 mg by mouth daily.  . hydrochlorothiazide (HYDRODIURIL) 25 MG tablet Take 1 tablet (25 mg total) by mouth daily.   No facility-administered encounter medications on file as of 12/06/2020.    Past Surgical History:  Procedure Laterality Date  . DILITATION & CURRETTAGE/HYSTROSCOPY WITH NOVASURE ABLATION N/A 07/21/2016   Procedure: DILATATION & CURETTAGE/HYSTEROSCOPY WITH NOVASURE ENDOMETRIAL ABLATION;  Surgeon: Jonnie Kind, MD;  Location: [facility];  Service: Gynecology;  Laterality: N/A;  . EXTRACORPOREAL SHOCK WAVE LITHOTRIPSY Right 03/01/2017   Procedure: RIGHT  EXTRACORPOREAL SHOCK WAVE LITHOTRIPSY (ESWL);  Surgeon: Festus Aloe, MD;  Location: [facility];  Service: Urology;  Laterality: Right;  . EXTRACORPOREAL SHOCK WAVE LITHOTRIPSY Right 04/05/2017   Procedure: RIGHT EXTRACORPOREAL SHOCK WAVE LITHOTRIPSY (ESWL);  Surgeon: Raynelle Bring, MD;  Location: [facility];  Service: Urology;  Laterality: Right;  . POLYPECTOMY N/A 07/21/2016   Procedure: REMOVAL OF ENDOMETRIAL POLYP;  Surgeon: Jonnie Kind, MD;  Location: [facility];  Service: Gynecology;  Laterality: N/A;  . WISDOM TOOTH EXTRACTION      Family History  Problem Relation Age of Onset  . Hyperlipidemia Mother   . Hypertension Mother   . Cancer Mother        breast  . Breast cancer Mother   . Heart disease Father   . Hyperlipidemia Father   . Hypertension Father   . Hyperlipidemia Maternal Grandmother   . Breast cancer Maternal Grandmother   . Hyperlipidemia Maternal Grandfather   . Hyperlipidemia Paternal Grandmother   . Hyperlipidemia Paternal Grandfather     New complaints: Burning on urination and pain on right lower side. She was recently on abx so she isn't sure if it may be a vaginal yeast infection. She has been drinking a lot of water and lemon with no relief.   Social history: Lives with husband.   Controlled substance contract:  N/A     Review of Systems  Constitutional: Negative for chills, fatigue and fever.  Respiratory: Negative for cough, shortness of breath and wheezing.   Cardiovascular: Negative for chest pain.  Gastrointestinal: Negative for abdominal pain, constipation, diarrhea and nausea.  Genitourinary: Positive for dysuria. Negative for difficulty urinating and hematuria.  Musculoskeletal: Positive for back pain.  Neurological: Negative for dizziness, light-headedness and headaches.  Psychiatric/Behavioral: The patient is not nervous/anxious.    Urine dipstick: (-) leukocytes (-) nitrates    Objective:   Physical Exam Eyes:     Extraocular  Movements: Extraocular movements intact.     Pupils: Pupils are equal, round, and reactive to light.  Cardiovascular:     Rate and Rhythm: Normal rate and regular rhythm.     Pulses: Normal pulses.     Heart sounds: Normal heart sounds.  Pulmonary:     Effort: Pulmonary effort is normal.     Breath sounds: Normal breath sounds.  Abdominal:     General: Abdomen is flat. Bowel sounds are normal.     Palpations: Abdomen is soft.  Musculoskeletal:        General: Normal range of motion.     Cervical back: Normal range of motion and neck supple.  Skin:    General: Skin is warm and dry.     Capillary Refill: Capillary refill takes less than 2 seconds.  Neurological:     Mental Status: She is alert and oriented to person, place, and time.  Psychiatric:        Mood and Affect: Mood normal.        Behavior: Behavior normal.    Vitals:   12/06/20 0838  BP: 113/83  Pulse: 61  Resp: 20  Temp: 97.6 F (36.4 C)  SpO2: 98%       Assessment & Plan:   Michelle Sellers comes in today with chief complaint of Medical Management of Chronic Issues   Diagnosis and orders addressed:  1. Primary hypertension Continue HCTZ. Continue walking and watching your diet.  - CBC with Differential/Platelet - CMP14+EGFR - hydrochlorothiazide (HYDRODIURIL) 25 MG tablet; Take 1 tablet (25 mg total) by mouth daily.  Dispense: 90 tablet; Refill: 1  2. Pure hypercholesterolemia Discussed family hx and risk factor. She is not interested in starting a statin at this time.  - Lipid panel  3. GAD (generalized anxiety disorder) She does not feel she needs any assistance controlling her anxiety at this time.   4. BMI 30.0-30.9,adult Continue low fat diet and regular exercise.   5. Dysuria  - Urinalysis, Complete  6. Vaginal candidiasis Take fluconazole to treat. Contact the office if symptoms do not resolve after course.   - fluconazole (DIFLUCAN) 150 MG tablet; 1 po q week x 4 weeks   Dispense: 4 tablet; Refill: 0    Labs pending Health Maintenance reviewed Diet and exercise encouraged  Follow up plan: 6 months  Dollene Primrose, RN, BSN, FNP-Student    Mary-Margaret Hassell Done, FNP

## 2020-12-10 ENCOUNTER — Other Ambulatory Visit: Payer: BC Managed Care – PPO | Admitting: Adult Health

## 2020-12-11 ENCOUNTER — Telehealth: Payer: Self-pay

## 2020-12-11 NOTE — Telephone Encounter (Signed)
I do not see that a culture is pending. Should we have patient come in and leave urine to send?

## 2020-12-11 NOTE — Telephone Encounter (Signed)
If she is still having dysuria, I agree she needs a culture.

## 2020-12-11 NOTE — Telephone Encounter (Signed)
Scheduled appointment for tomorrow

## 2020-12-12 ENCOUNTER — Ambulatory Visit: Payer: BC Managed Care – PPO | Admitting: Nurse Practitioner

## 2020-12-25 ENCOUNTER — Ambulatory Visit: Payer: BC Managed Care – PPO | Admitting: Nurse Practitioner

## 2020-12-26 ENCOUNTER — Encounter: Payer: Self-pay | Admitting: Nurse Practitioner

## 2021-01-29 ENCOUNTER — Other Ambulatory Visit: Payer: BC Managed Care – PPO | Admitting: Adult Health

## 2021-02-06 ENCOUNTER — Encounter: Payer: Self-pay | Admitting: Adult Health

## 2021-02-06 ENCOUNTER — Other Ambulatory Visit: Payer: Self-pay

## 2021-02-06 ENCOUNTER — Ambulatory Visit (INDEPENDENT_AMBULATORY_CARE_PROVIDER_SITE_OTHER): Payer: BC Managed Care – PPO | Admitting: Adult Health

## 2021-02-06 VITALS — BP 112/85 | HR 62 | Ht 65.0 in | Wt 168.0 lb

## 2021-02-06 DIAGNOSIS — Z01419 Encounter for gynecological examination (general) (routine) without abnormal findings: Secondary | ICD-10-CM

## 2021-02-06 DIAGNOSIS — Z1231 Encounter for screening mammogram for malignant neoplasm of breast: Secondary | ICD-10-CM

## 2021-02-06 DIAGNOSIS — R232 Flushing: Secondary | ICD-10-CM

## 2021-02-06 DIAGNOSIS — Z780 Asymptomatic menopausal state: Secondary | ICD-10-CM

## 2021-02-06 DIAGNOSIS — Z1211 Encounter for screening for malignant neoplasm of colon: Secondary | ICD-10-CM | POA: Diagnosis not present

## 2021-02-06 DIAGNOSIS — F419 Anxiety disorder, unspecified: Secondary | ICD-10-CM

## 2021-02-06 LAB — HEMOCCULT GUIAC POC 1CARD (OFFICE): Fecal Occult Blood, POC: NEGATIVE

## 2021-02-06 MED ORDER — PAROXETINE HCL 10 MG PO TABS: 10.0000 mg | ORAL_TABLET | Freq: Every day | ORAL | 6 refills | Status: DC

## 2021-02-06 NOTE — Progress Notes (Signed)
Patient ID: Michelle Sellers, female   DOB: 15-Feb-1971, 50 y.o.   MRN: [id number] History of Present Illness: Michelle Sellers is a 50 year old white female,married, G2P2, in for [place] well woman gyn exam. Lab Results  Component Value Date   DIAGPAP  10/25/2019    - Negative for intraepithelial lesion or malignancy (NILM)   HPVHIGH Negative 10/25/2019  She is having hot flashes, esp at night, increased anxiety and mood, and has always been a light sleeper. Has stress at work.  PCP is Michelle Daphine Deutscher NP   Current Medications, Allergies, Past Medical History, Past Surgical History, Family History and Social History were reviewed in [name].     Review of Systems: Patient denies any headaches, hearing loss, fatigue, blurred vision, shortness of breath, chest pain, abdominal pain, problems with bowel movements, urination, or intercourse. No joint pain or mood swings. See HPI for positives.    Physical Exam:BP 112/85 (BP Location: Right Arm, Patient Position: Sitting, Cuff Size: Normal)   Pulse 62   Ht 5\' 5"  (1.651 m)   Wt 168 lb (76.2 kg)   BMI 27.96 kg/m  General:  Well developed, well nourished, no acute distress Skin:  Warm and dry Neck:  Midline trachea, normal thyroid, good ROM, no lymphadenopathy Lungs; Clear to auscultation bilaterally Breast:  No dominant palpable mass, retraction, or nipple discharge Cardiovascular: Regular rate and rhythm Abdomen:  Soft, non tender, no hepatosplenomegaly Pelvic:  External genitalia is normal in appearance, no lesions.  The vagina is normal in appearance. Urethra has no lesions or masses. The cervix is bulbous.  Uterus is felt to be normal size, shape, and contour.  No adnexal masses or tenderness noted.Bladder is non tender, no masses felt. Rectal: Good sphincter tone, no polyps, or hemorrhoids felt.  Hemoccult negative. Extremities/musculoskeletal:  No swelling or varicosities noted, no clubbing or cyanosis Psych:   No mood changes, alert and cooperative,seems happy AA is 3 Fall risk is low Depression screen [facility] 2/9 02/06/2021 12/06/2020 09/12/2020  Decreased Interest 0 0 0  Down, Depressed, Hopeless 0 0 0  PHQ - 2 Score 0 0 0  Altered sleeping 1 - -  Tired, decreased energy 1 - -  Change in appetite 0 - -  Feeling bad or failure about yourself  0 - -  Trouble concentrating 0 - -  Moving slowly or fidgety/restless 0 - -  Suicidal thoughts 0 - -  PHQ-9 Score 2 - -   GAD 7 : Generalized Anxiety Score 02/06/2021 05/16/2020 08/06/2015  Nervous, Anxious, on Edge 1 3 3   Control/stop worrying 1 3 3   Worry too much - different things 0 3 3  Trouble relaxing 0 0 3  Restless 0 1 2  Easily annoyed or irritable 0 0 2  Afraid - awful might happen 0 0 0  Total GAD 7 Score 2 10 16   Anxiety Difficulty - - Very difficult    Upstream - 02/06/21 0859      Pregnancy Intention Screening   Does the patient want to become pregnant in the next year? No    Does the patient's partner want to become pregnant in the next year? No    Would the patient like to discuss contraceptive options today? No      Contraception Wrap Up   Current Method Female Sterilization    End Method Female Sterilization    Contraception Counseling Provided No         Examination chaperoned by .  Impression and Plan; 1. Encounter for well woman exam with routine gynecological exam Physical in 1 year Pap in 2024 Mammogram in August in [place] with PCP  2. Encounter for screening fecal occult blood testing  3. Menopause  4. Hot flashes  5. Anxiety Will try Paxil 10 mg to see if helps with hot flashes and anxiety   6. Screening mammogram for breast cancer In [place] in August

## 2021-02-28 ENCOUNTER — Other Ambulatory Visit: Payer: Self-pay | Admitting: Adult Health

## 2021-02-28 ENCOUNTER — Ambulatory Visit (INDEPENDENT_AMBULATORY_CARE_PROVIDER_SITE_OTHER): Payer: BC Managed Care – PPO | Admitting: Family Medicine

## 2021-02-28 ENCOUNTER — Telehealth: Payer: Self-pay | Admitting: Nurse Practitioner

## 2021-02-28 ENCOUNTER — Other Ambulatory Visit: Payer: Self-pay

## 2021-02-28 ENCOUNTER — Encounter: Payer: Self-pay | Admitting: Family Medicine

## 2021-02-28 VITALS — BP 120/83 | HR 64 | Temp 97.9°F | Ht 65.0 in | Wt 166.8 lb

## 2021-02-28 DIAGNOSIS — L03211 Cellulitis of face: Secondary | ICD-10-CM | POA: Diagnosis not present

## 2021-02-28 DIAGNOSIS — L538 Other specified erythematous conditions: Secondary | ICD-10-CM | POA: Diagnosis not present

## 2021-02-28 DIAGNOSIS — R220 Localized swelling, mass and lump, head: Secondary | ICD-10-CM | POA: Diagnosis not present

## 2021-02-28 DIAGNOSIS — H0100A Unspecified blepharitis right eye, upper and lower eyelids: Secondary | ICD-10-CM | POA: Diagnosis not present

## 2021-02-28 MED ORDER — AMOXICILLIN-POT CLAVULANATE 875-125 MG PO TABS
1.0000 | ORAL_TABLET | Freq: Two times a day (BID) | ORAL | 0 refills | Status: DC
Start: 2021-02-28 — End: 2021-03-12

## 2021-02-28 NOTE — Telephone Encounter (Signed)
CALLED PATIENT, NO ANSWER, LEFT MESSAGE TO RETURN CALL 

## 2021-02-28 NOTE — Telephone Encounter (Signed)
The pills will work far far better. Nothing is instant. For relief use warm compresses aand tylenol. Ointment for the eye isn't going to help the lids.

## 2021-02-28 NOTE — Progress Notes (Signed)
blepharitis Chief Complaint  Patient presents with   Eye Problem    Right eye swollen      HPI  Patient presents today for onset last night of pain and swelling of right eye. Itching also. Wears glasses.  PMH: Smoking status noted ROS: Per HPI  Objective: BP 120/83   Pulse 64   Temp 97.9 F (36.6 C)   Ht 5\' 5"  (1.651 m)   Wt 166 lb 12.8 oz (75.7 kg)   SpO2 97%   BMI 27.76 kg/m  Gen: NAD, alert, cooperative with exam HEENT: NCAT, EOMI, PERRL the conjunctiva are noninjected.  There is no discharge. 2+ edema and erythema of right upper and lower eyelids Conjunctiva not injected Neuro: Alert and oriented, No gross deficits  Assessment and plan:  1. Blepharitis of both upper and lower eyelid of right eye, unspecified type     Meds ordered this encounter  Medications   amoxicillin-clavulanate (AUGMENTIN) 875-125 MG tablet    Sig: Take 1 tablet by mouth 2 (two) times daily. Take all of this medication    Dispense:  20 tablet    Refill:  0     Follow up as needed.  , MD

## 2021-02-28 NOTE — Telephone Encounter (Signed)
Pt wants an abx eye ointment send to [facility]  She said her eye has gotten worse this passed hour.

## 2021-03-02 DIAGNOSIS — L259 Unspecified contact dermatitis, unspecified cause: Secondary | ICD-10-CM | POA: Diagnosis not present

## 2021-03-02 DIAGNOSIS — R21 Rash and other nonspecific skin eruption: Secondary | ICD-10-CM | POA: Diagnosis not present

## 2021-03-04 NOTE — Telephone Encounter (Signed)
Encounter closed, patient went to ER over weekend

## 2021-03-12 ENCOUNTER — Encounter: Payer: Self-pay | Admitting: Family Medicine

## 2021-03-12 ENCOUNTER — Ambulatory Visit (INDEPENDENT_AMBULATORY_CARE_PROVIDER_SITE_OTHER): Payer: BC Managed Care – PPO | Admitting: Family Medicine

## 2021-03-12 ENCOUNTER — Other Ambulatory Visit: Payer: Self-pay

## 2021-03-12 ENCOUNTER — Ambulatory Visit (INDEPENDENT_AMBULATORY_CARE_PROVIDER_SITE_OTHER): Payer: BC Managed Care – PPO

## 2021-03-12 VITALS — BP 112/77 | HR 80 | Temp 98.1°F | Ht 65.0 in | Wt 175.2 lb

## 2021-03-12 DIAGNOSIS — R079 Chest pain, unspecified: Secondary | ICD-10-CM

## 2021-03-12 DIAGNOSIS — R0602 Shortness of breath: Secondary | ICD-10-CM | POA: Diagnosis not present

## 2021-03-12 DIAGNOSIS — R5383 Other fatigue: Secondary | ICD-10-CM | POA: Diagnosis not present

## 2021-03-12 DIAGNOSIS — R059 Cough, unspecified: Secondary | ICD-10-CM | POA: Diagnosis not present

## 2021-03-12 LAB — URINALYSIS
Bilirubin, UA: NEGATIVE
Glucose, UA: NEGATIVE
Ketones, UA: NEGATIVE
Leukocytes,UA: NEGATIVE
Nitrite, UA: NEGATIVE
Protein,UA: NEGATIVE
RBC, UA: NEGATIVE
Specific Gravity, UA: 1.015 (ref 1.005–1.030)
Urobilinogen, Ur: 0.2 mg/dL (ref 0.2–1.0)
pH, UA: 6.5 (ref 5.0–7.5)

## 2021-03-12 LAB — HEMOGLOBIN, FINGERSTICK: Hemoglobin: 12.3 g/dL (ref 11.1–15.9)

## 2021-03-12 NOTE — Progress Notes (Signed)
Subjective:  Patient ID: Michelle Sellers, female    DOB: 1971/04/13  Age: 50 y.o. MRN: [id number]  CC: Fatigue, DRY MOUTH, LEG CRAMPS, and Chest Pain   HPI Michelle Sellers presents for weakness onset 1 week ago. Feels like she ran a marathon. Mouth dry. CoVID neg at home yest. . Everything tastes salty. Chest pain during the night last night with dyspnea. Felt like a "closing in." It was moderately severe. Self-limited after an undetermined time.  Getting a lot of fluid including sports drinks.  These don't help for very long. No NVD. A little upset stomach for 2 days. She had just finished two different antibiotics for blepharitis.  Depression screen [facility] 2/9 03/12/2021 02/28/2021 02/28/2021  Decreased Interest 0 0 0  Down, Depressed, Hopeless 0 0 0  PHQ - 2 Score 0 0 0  Altered sleeping - 1 -  Tired, decreased energy - 0 -  Change in appetite - 0 -  Feeling bad or failure about yourself  - 0 -  Trouble concentrating - 0 -  Moving slowly or fidgety/restless - 0 -  Suicidal thoughts - 0 -  PHQ-9 Score - 1 -  Difficult doing work/chores - Not difficult at all -    History Michelle Sellers has a past medical history of Allergy, Anxiety, Chronic kidney disease, History of kidney stones, Menorrhagia with regular cycle (08/06/2014), and Peri-menopause (08/06/2014).   She has a past surgical history that includes Wisdom tooth extraction; Dilatation & currettage/hysteroscopy with novasure ablation (N/A, 07/21/2016); polypectomy (N/A, 07/21/2016); Extracorporeal shock wave lithotripsy (Right, 03/01/2017); and Extracorporeal shock wave lithotripsy (Right, 04/05/2017).   Her family history includes Breast cancer in her maternal grandmother and mother; Cancer in her mother; Heart disease in her father; Hyperlipidemia in her father, maternal grandfather, maternal grandmother, mother, paternal grandfather, and paternal grandmother; Hypertension in her father and mother.She reports that she has never  smoked. She has never used smokeless tobacco. She reports current alcohol use of about 1.0 standard drink of alcohol per week. She reports that she does not use drugs.    ROS Review of Systems  Constitutional:  Positive for fatigue. Negative for diaphoresis and fever.  HENT: Negative.  Negative for sneezing and sore throat.   Eyes:  Negative for visual disturbance.  Respiratory:  Negative for cough and shortness of breath.   Cardiovascular:  Negative for chest pain.  Gastrointestinal:  Negative for abdominal pain.  Musculoskeletal:  Negative for arthralgias.   Objective:  BP 112/77   Pulse 80   Temp 98.1 F (36.7 C)   Ht 5' 5"  (1.651 m)   Wt 175 lb 3.2 oz (79.5 kg)   SpO2 99%   BMI 29.15 kg/m   BP Readings from Last 3 Encounters:  03/12/21 112/77  02/28/21 120/83  02/06/21 112/85    Wt Readings from Last 3 Encounters:  03/12/21 175 lb 3.2 oz (79.5 kg)  02/28/21 166 lb 12.8 oz (75.7 kg)  02/06/21 168 lb (76.2 kg)     Physical Exam Constitutional:      General: She is not in acute distress.    Appearance: She is well-developed.  HENT:     Head: Normocephalic and atraumatic.  Eyes:     Conjunctiva/sclera: Conjunctivae normal.     Pupils: Pupils are equal, round, and reactive to light.  Neck:     Thyroid: No thyromegaly.  Cardiovascular:     Rate and Rhythm: Normal rate and regular rhythm.     Heart sounds: Normal heart  sounds. No murmur heard. Pulmonary:     Effort: Pulmonary effort is normal. No respiratory distress.     Breath sounds: Normal breath sounds. No wheezing or rales.  Abdominal:     General: Bowel sounds are normal. There is no distension.     Palpations: Abdomen is soft.     Tenderness: There is no abdominal tenderness.  Musculoskeletal:        General: Normal range of motion.     Cervical back: Normal range of motion and neck supple.  Lymphadenopathy:     Cervical: No cervical adenopathy.  Skin:    General: Skin is warm and dry.   Neurological:     Mental Status: She is alert and oriented to person, place, and time.  Psychiatric:        Behavior: Behavior normal.        Thought Content: Thought content normal.        Judgment: Judgment normal.   EKG: NSR, no ischemic changes   Assessment & Plan:   Catheline was seen today for fatigue, dry mouth, leg cramps and chest pain.  Diagnoses and all orders for this visit:  Chest pain, unspecified type -     EKG 12-Lead -     DG Chest 2 View; Future -     Urinalysis -     CBC with Differential/Platelet -     CMP14+EGFR -     D-dimer, quantitative -     Hemoglobin, fingerstick  Shortness of breath -     DG Chest 2 View; Future -     CBC with Differential/Platelet -     D-dimer, quantitative  Fatigue, unspecified type -     EKG 12-Lead -     DG Chest 2 View; Future -     Urinalysis -     CBC with Differential/Platelet -     CMP14+EGFR -     D-dimer, quantitative -     Hemoglobin, fingerstick      I have discontinued Michelle Petre "Pam"'s amoxicillin-clavulanate. I am also having her maintain her fexofenadine, hydrochlorothiazide, and PARoxetine.  Allergies as of 03/12/2021   No Known Allergies      Medication List        Accurate as of March 12, 2021  9:53 PM. If you have any questions, ask your nurse or doctor.          STOP taking these medications    amoxicillin-clavulanate 875-125 MG tablet Commonly known as: AUGMENTIN Stopped by: Claretta Fraise, MD       TAKE these medications    fexofenadine 180 MG tablet Commonly known as: ALLEGRA Take 180 mg by mouth daily.   hydrochlorothiazide 25 MG tablet Commonly known as: HYDRODIURIL Take 1 tablet (25 mg total) by mouth daily.   PARoxetine 10 MG tablet Commonly known as: PAXIL TAKE 1 TABLET BY MOUTH EVERY DAY         Follow-up: No follow-ups on file.  Claretta Fraise, M.D.

## 2021-03-13 ENCOUNTER — Other Ambulatory Visit: Payer: BC Managed Care – PPO

## 2021-03-13 ENCOUNTER — Ambulatory Visit (HOSPITAL_COMMUNITY)
Admission: RE | Admit: 2021-03-13 | Discharge: 2021-03-13 | Disposition: A | Discharge status: Home / Self Care | Payer: BC Managed Care – PPO | Source: Ambulatory Visit | Attending: Family Medicine | Admitting: Family Medicine

## 2021-03-13 ENCOUNTER — Other Ambulatory Visit: Payer: Self-pay | Admitting: *Deleted

## 2021-03-13 ENCOUNTER — Other Ambulatory Visit: Payer: Self-pay | Admitting: Family Medicine

## 2021-03-13 DIAGNOSIS — R072 Precordial pain: Secondary | ICD-10-CM | POA: Diagnosis not present

## 2021-03-13 DIAGNOSIS — R0600 Dyspnea, unspecified: Secondary | ICD-10-CM | POA: Diagnosis not present

## 2021-03-13 DIAGNOSIS — Z1152 Encounter for screening for COVID-19: Secondary | ICD-10-CM | POA: Diagnosis not present

## 2021-03-13 DIAGNOSIS — R079 Chest pain, unspecified: Secondary | ICD-10-CM

## 2021-03-13 DIAGNOSIS — R5383 Other fatigue: Secondary | ICD-10-CM

## 2021-03-13 DIAGNOSIS — R531 Weakness: Secondary | ICD-10-CM | POA: Diagnosis not present

## 2021-03-13 LAB — CBC WITH DIFFERENTIAL/PLATELET
Basophils Absolute: 0 10*3/uL (ref 0.0–0.2)
Basos: 0 %
EOS (ABSOLUTE): 0.1 10*3/uL (ref 0.0–0.4)
Eos: 1 %
Hematocrit: 37.5 % (ref 34.0–46.6)
Hemoglobin: 12.7 g/dL (ref 11.1–15.9)
Immature Grans (Abs): 0.1 10*3/uL (ref 0.0–0.1)
Immature Granulocytes: 1 %
Lymphocytes Absolute: 2.3 10*3/uL (ref 0.7–3.1)
Lymphs: 15 %
MCH: 29.7 pg (ref 26.6–33.0)
MCHC: 33.9 g/dL (ref 31.5–35.7)
MCV: 88 fL (ref 79–97)
Monocytes Absolute: 1 10*3/uL — ABNORMAL HIGH (ref 0.1–0.9)
Monocytes: 7 %
Neutrophils Absolute: 11.3 10*3/uL — ABNORMAL HIGH (ref 1.4–7.0)
Neutrophils: 76 %
Platelets: 184 10*3/uL (ref 150–450)
RBC: 4.28 x10E6/uL (ref 3.77–5.28)
RDW: 13.5 % (ref 11.7–15.4)
WBC: 14.9 10*3/uL — ABNORMAL HIGH (ref 3.4–10.8)

## 2021-03-13 LAB — CMP14+EGFR
ALT: 138 IU/L — ABNORMAL HIGH (ref 0–32)
AST: 80 IU/L — ABNORMAL HIGH (ref 0–40)
Albumin/Globulin Ratio: 1.8 (ref 1.2–2.2)
Albumin: 3.6 g/dL — ABNORMAL LOW (ref 3.8–4.9)
Alkaline Phosphatase: 72 IU/L (ref 44–121)
BUN/Creatinine Ratio: 24 — ABNORMAL HIGH (ref 9–23)
BUN: 14 mg/dL (ref 6–24)
Bilirubin Total: 0.4 mg/dL (ref 0.0–1.2)
CO2: 24 mmol/L (ref 20–29)
Calcium: 8.4 mg/dL — ABNORMAL LOW (ref 8.7–10.2)
Chloride: 105 mmol/L (ref 96–106)
Creatinine, Ser: 0.59 mg/dL (ref 0.57–1.00)
Globulin, Total: 2 g/dL (ref 1.5–4.5)
Glucose: 99 mg/dL (ref 65–99)
Potassium: 3.7 mmol/L (ref 3.5–5.2)
Sodium: 141 mmol/L (ref 134–144)
Total Protein: 5.6 g/dL — ABNORMAL LOW (ref 6.0–8.5)
eGFR: 109 mL/min/{1.73_m2} (ref >59)

## 2021-03-13 LAB — D-DIMER, QUANTITATIVE: D-DIMER: 0.84 mg/L FEU — ABNORMAL HIGH (ref 0.00–0.49)

## 2021-03-13 MED ORDER — IOHEXOL 350 MG/ML SOLN
100.0000 mL | Freq: Once | INTRAVENOUS | Status: AC | PRN
  Administered 2021-03-13: 100 mL via INTRAVENOUS

## 2021-03-13 NOTE — Addendum Note (Signed)
Addended by: Adella Hare B on: 03/13/2021 11:59 AM   Modules accepted: Orders

## 2021-03-14 LAB — NOVEL CORONAVIRUS, NAA: SARS-CoV-2, NAA: NOT DETECTED

## 2021-03-14 LAB — SARS-COV-2, NAA 2 DAY TAT

## 2021-03-18 ENCOUNTER — Encounter: Payer: Self-pay | Admitting: Nurse Practitioner

## 2021-03-18 ENCOUNTER — Ambulatory Visit (INDEPENDENT_AMBULATORY_CARE_PROVIDER_SITE_OTHER): Payer: BC Managed Care – PPO | Admitting: Nurse Practitioner

## 2021-03-18 ENCOUNTER — Other Ambulatory Visit: Payer: Self-pay

## 2021-03-18 VITALS — BP 117/87 | HR 63 | Temp 97.0°F | Resp 20 | Ht 65.0 in | Wt 168.0 lb

## 2021-03-18 DIAGNOSIS — T7840XD Allergy, unspecified, subsequent encounter: Secondary | ICD-10-CM

## 2021-03-18 DIAGNOSIS — R300 Dysuria: Secondary | ICD-10-CM | POA: Diagnosis not present

## 2021-03-18 LAB — URINALYSIS, COMPLETE
Bilirubin, UA: NEGATIVE
Glucose, UA: NEGATIVE
Ketones, UA: NEGATIVE
Nitrite, UA: NEGATIVE
Protein,UA: NEGATIVE
RBC, UA: NEGATIVE
Specific Gravity, UA: 1.015 (ref 1.005–1.030)
Urobilinogen, Ur: 0.2 mg/dL (ref 0.2–1.0)
pH, UA: 6 (ref 5.0–7.5)

## 2021-03-18 LAB — MICROSCOPIC EXAMINATION: RBC, Urine: NONE SEEN /hpf (ref 0–2)

## 2021-03-18 MED ORDER — PREDNISONE 10 MG (21) PO TBPK: ORAL_TABLET | ORAL | 0 refills | Status: DC

## 2021-03-18 NOTE — Progress Notes (Signed)
   Subjective:    Patient ID: Michelle Sellers, female    DOB: April 08, 1971, 50 y.o.   MRN: [id number]   Chief Complaint: ear pain  HPI Patient comes in today for ear pain. She has been seen several times at what was thought to be right right infection. She had the meds changed 3 times. Eye is better. She says the right side of her face had rash was swollen and painful. She is not sure if rash went across midline on forehead. But right side of face still feels swollen with ear pain. Is gradually starting to come back just like it started. The ED was sure it is not shingles.     Review of Systems  Constitutional:  Negative for diaphoresis.  Eyes:  Negative for pain.  Respiratory:  Negative for shortness of breath.   Cardiovascular:  Negative for chest pain, palpitations and leg swelling.  Gastrointestinal:  Negative for abdominal pain.  Endocrine: Negative for polydipsia.  Skin:  Negative for rash.  Neurological:  Negative for dizziness, weakness and headaches.  Hematological:  Does not bruise/bleed easily.  All other systems reviewed and are negative.     Objective:   Physical Exam Constitutional:      Appearance: Normal appearance.  HENT:     Ears:     Comments: Iight ear lobe mildly edmatous. No erythem Patient says feels like swelling. Cardiovascular:     Rate and Rhythm: Normal rate and regular rhythm.     Heart sounds: Normal heart sounds.  Pulmonary:     Effort: Pulmonary effort is normal.     Breath sounds: Normal breath sounds.  Skin:    General: Skin is warm.     Findings: No rash.  Neurological:     General: No focal deficit present.     Mental Status: She is alert and oriented to person, place, and time.  Psychiatric:        Mood and Affect: Mood normal.        Behavior: Behavior normal.    BP 117/87   Pulse 63   Temp (!) 97 F (36.1 C) (Temporal)   Resp 20   Ht 5\' 5"  (1.651 m)   Wt 168 lb (76.2 kg)   SpO2 100%   BMI 27.96 kg/m         Assessment & Plan:   Michelle Sellers in today with chief complaint of No chief complaint on file.   1. Dysuria Urine clear - Urinalysis, Complete  2. Allergic reaction, subsequent encounter Meds ordered this encounter  Medications   predniSONE (STERAPRED UNI-PAK 21 TAB) 10 MG (21) TBPK tablet    Sig: As directed x 6 days    Dispense:  21 tablet    Refill:  0    Order Specific Question:   Supervising Provider    Answer:   [facility]   Call if worsens    The above assessment and management plan was discussed with the patient. The patient verbalized understanding of and has agreed to the management plan. Patient is aware to call the clinic if symptoms persist or worsen. Patient is aware when to return to the clinic for a follow-up visit. Patient educated on when it is appropriate to go to the emergency department.   Michelle [id number], FNP

## 2021-06-10 ENCOUNTER — Ambulatory Visit: Payer: Self-pay | Admitting: Nurse Practitioner

## 2021-07-04 ENCOUNTER — Ambulatory Visit: Payer: BC Managed Care – PPO | Admitting: Nurse Practitioner

## 2021-07-10 DIAGNOSIS — Z1231 Encounter for screening mammogram for malignant neoplasm of breast: Secondary | ICD-10-CM | POA: Diagnosis not present

## 2021-07-17 ENCOUNTER — Encounter: Payer: Self-pay | Admitting: Adult Health

## 2021-07-17 ENCOUNTER — Encounter: Payer: Self-pay | Admitting: Nurse Practitioner

## 2021-07-17 ENCOUNTER — Other Ambulatory Visit: Payer: Self-pay

## 2021-07-17 ENCOUNTER — Ambulatory Visit: Payer: BC Managed Care – PPO | Admitting: Nurse Practitioner

## 2021-07-17 VITALS — BP 110/79 | HR 73 | Temp 97.7°F | Resp 20 | Ht 65.0 in | Wt 169.0 lb

## 2021-07-17 DIAGNOSIS — Z1211 Encounter for screening for malignant neoplasm of colon: Secondary | ICD-10-CM

## 2021-07-17 DIAGNOSIS — M545 Low back pain, unspecified: Secondary | ICD-10-CM | POA: Diagnosis not present

## 2021-07-17 DIAGNOSIS — E7800 Pure hypercholesterolemia, unspecified: Secondary | ICD-10-CM | POA: Diagnosis not present

## 2021-07-17 DIAGNOSIS — I10 Essential (primary) hypertension: Secondary | ICD-10-CM | POA: Diagnosis not present

## 2021-07-17 DIAGNOSIS — F419 Anxiety disorder, unspecified: Secondary | ICD-10-CM

## 2021-07-17 DIAGNOSIS — Z6830 Body mass index (BMI) 30.0-30.9, adult: Secondary | ICD-10-CM

## 2021-07-17 LAB — MICROSCOPIC EXAMINATION: Renal Epithel, UA: NONE SEEN /hpf

## 2021-07-17 LAB — URINALYSIS, COMPLETE
Bilirubin, UA: NEGATIVE
Glucose, UA: NEGATIVE
Ketones, UA: NEGATIVE
Nitrite, UA: NEGATIVE
Protein,UA: NEGATIVE
Specific Gravity, UA: 1.02 (ref 1.005–1.030)
Urobilinogen, Ur: 0.2 mg/dL (ref 0.2–1.0)
pH, UA: 7.5 (ref 5.0–7.5)

## 2021-07-17 MED ORDER — METHYLPREDNISOLONE ACETATE 80 MG/ML IJ SUSP
80.0000 mg | Freq: Once | INTRAMUSCULAR | Status: AC
Start: 2021-07-17 — End: 2021-07-17
  Administered 2021-07-17: 80 mg via INTRAMUSCULAR

## 2021-07-17 MED ORDER — HYDROCHLOROTHIAZIDE 25 MG PO TABS: 25.0000 mg | ORAL_TABLET | Freq: Every day | ORAL | 1 refills | Status: DC

## 2021-07-17 MED ORDER — ESCITALOPRAM OXALATE 10 MG PO TABS: 10.0000 mg | ORAL_TABLET | Freq: Every day | ORAL | 5 refills | Status: DC

## 2021-07-17 NOTE — Progress Notes (Addendum)
Subjective:    Patient ID: Michelle Sellers, female    DOB: 07/24/1971, 50 y.o.   MRN: [id number]   Chief Complaint: Medical Management of Chronic Issues    HPI:  1. Right-sided low back pain without sciatica, unspecified chronicity This is a new issue today. Patient says started a few days ago. Rates 4-5/10 today.pain doe snot radiate down leg. Standing and walking increases pain. Sitting decreases pain.  2. Essential hypertension No c/o chest pain, sob or  headache. Does not check blood pressure at home BP Readings from Last 3 Encounters:  07/17/21 110/79  03/18/21 117/87  03/12/21 112/77     3. Pure hypercholesterolemia Does try to watch diet and stays very active  4. Anxiety Is doing well. No recent anxiety. Still feels stressed was on paxil for awhile but did not seem to be helping. GAD 7 : Generalized Anxiety Score 07/17/2021 02/28/2021 02/06/2021 05/16/2020  Nervous, Anxious, on Edge 0 _0 Control/stop worrying 0 0 1 3  Worry too much - different things 0 0 0 3  Trouble relaxing 0 0 0 0  Restless 0 0 0 1  Easily annoyed or irritable 0 0 0 0  Afraid - awful might happen 0 0 0 0  Total GAD 7 Score 0 _1 Anxiety Difficulty Not difficult at all Not difficult at all - -      5. BMI 30.0-30.9,adult No recent weight changes Wt Readings from Last 3 Encounters:  07/17/21 169 lb (76.7 kg)  03/18/21 168 lb (76.2 kg)  03/12/21 175 lb 3.2 oz (79.5 kg)   BMI Readings from Last 3 Encounters:  07/17/21 28.12 kg/m  03/18/21 27.96 kg/m  03/12/21 29.15 kg/m       Outpatient Encounter Medications as of 07/17/2021  Medication Sig   fexofenadine (ALLEGRA) 180 MG tablet Take 180 mg by mouth daily.   hydrochlorothiazide (HYDRODIURIL) 25 MG tablet Take 1 tablet (25 mg total) by mouth daily.   [DISCONTINUED] PARoxetine (PAXIL) 10 MG tablet TAKE 1 TABLET BY MOUTH EVERY DAY   [DISCONTINUED] predniSONE (STERAPRED UNI-PAK 21 TAB) 10 MG (21) TBPK tablet As directed x  6 days   No facility-administered encounter medications on file as of 07/17/2021.    Past Surgical History:  Procedure Laterality Date   DILITATION & CURRETTAGE/HYSTROSCOPY WITH NOVASURE ABLATION N/A 07/21/2016   Procedure: DILATATION & CURETTAGE/HYSTEROSCOPY WITH NOVASURE ENDOMETRIAL ABLATION;  Surgeon: Jonnie Kind, MD;  Location: [facility];  Service: Gynecology;  Laterality: N/A;   EXTRACORPOREAL SHOCK WAVE LITHOTRIPSY Right 03/01/2017   Procedure: RIGHT EXTRACORPOREAL SHOCK WAVE LITHOTRIPSY (ESWL);  Surgeon: Festus Aloe, MD;  Location: [facility];  Service: Urology;  Laterality: Right;   EXTRACORPOREAL SHOCK WAVE LITHOTRIPSY Right 04/05/2017   Procedure: RIGHT EXTRACORPOREAL SHOCK WAVE LITHOTRIPSY (ESWL);  Surgeon: Raynelle Bring, MD;  Location: [facility];  Service: Urology;  Laterality: Right;   POLYPECTOMY N/A 07/21/2016   Procedure: REMOVAL OF ENDOMETRIAL POLYP;  Surgeon: Jonnie Kind, MD;  Location: [facility];  Service: Gynecology;  Laterality: N/A;   WISDOM TOOTH EXTRACTION      Family History  Problem Relation Age of Onset   Hyperlipidemia Mother    Hypertension Mother    Cancer Mother        breast   Breast cancer Mother    Heart disease Father    Hyperlipidemia Father    Hypertension Father    Hyperlipidemia Maternal Grandmother    Breast cancer Maternal Grandmother  Hyperlipidemia Maternal Grandfather    Hyperlipidemia Paternal Grandmother    Hyperlipidemia Paternal Grandfather     New complaints: Back pain as stated above  Social history: 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  Controlled substance contract: n/a     Review of Systems  Constitutional:  Negative for diaphoresis.  Eyes:  Negative for pain.  Respiratory:  Negative for shortness of breath.   Cardiovascular:  Negative for chest pain, palpitations and leg swelling.  Gastrointestinal:  Negative for abdominal pain.  Endocrine: Negative for polydipsia.  Skin:  Negative for rash.   Neurological:  Negative for dizziness, weakness and headaches.  Hematological:  Does not bruise/bleed easily.  All other systems reviewed and are negative.     Objective:   Physical Exam Vitals and nursing note reviewed.  Constitutional:      General: She is not in acute distress.    Appearance: Normal appearance. She is well-developed.  HENT:     Head: Normocephalic.     Right Ear: Tympanic membrane normal.     Left Ear: Tympanic membrane normal.     Nose: Nose normal.     Mouth/Throat:     Mouth: Mucous membranes are moist.  Eyes:     Pupils: Pupils are equal, round, and reactive to light.  Neck:     Vascular: No carotid bruit or JVD.  Cardiovascular:     Rate and Rhythm: Normal rate and regular rhythm.     Heart sounds: Normal heart sounds.  Pulmonary:     Effort: Pulmonary effort is normal. No respiratory distress.     Breath sounds: Normal breath sounds. No wheezing or rales.  Chest:     Chest wall: No tenderness.  Abdominal:     General: Bowel sounds are normal. There is no distension or abdominal bruit.     Palpations: Abdomen is soft. There is no hepatomegaly, splenomegaly, mass or pulsatile mass.     Tenderness: There is no abdominal tenderness.  Musculoskeletal:        General: Normal range of motion.     Cervical back: Normal range of motion and neck supple.  Lymphadenopathy:     Cervical: No cervical adenopathy.  Skin:    General: Skin is warm and dry.  Neurological:     Mental Status: She is alert and oriented to person, place, and time.     Deep Tendon Reflexes: Reflexes are normal and symmetric.  Psychiatric:        Behavior: Behavior normal.        Thought Content: Thought content normal.        Judgment: Judgment normal.    BP 110/79   Pulse 73   Temp 97.7 F (36.5 C) (Temporal)   Resp 20   Ht _0  (1.651 m)   Wt 169 lb (76.7 kg)   SpO2 97%   BMI 28.12 kg/m        Assessment & Plan:  Michelle Sellers comes in today with chief  complaint of Medical Management of Chronic Issues   Diagnosis and orders addressed:  1. Right-sided low back pain without sciatica, unspecified chronicity Moist heat Rest Rto if not improving - Urinalysis, Complete - methylPREDNISolone acetate (DEPO-MEDROL) injection 80 mg  2. Primary hypertension Low sodium diet - hydrochlorothiazide (HYDRODIURIL) 25 MG tablet; Take 1 tablet (25 mg total) by mouth daily.  Dispense: 90 tablet; Refill: 1 - CBC with Differential/Platelet - CMP14+EGFR  3. Pure hypercholesterolemia Low fat diet - Lipid panel  4. Anxiety  Stress management Started lexapro 58m daily - side effects discussed  5. BMI 30.0-30.9,adult Discussed diet and exercise for person with BMI >25 Will recheck weight in 3-6 months    Labs pending Health Maintenance reviewed Diet and exercise encouraged  Follow up plan: 6 months   Mary-Margaret MHassell Done FNP

## 2021-07-17 NOTE — Patient Instructions (Signed)

## 2021-07-18 LAB — CBC WITH DIFFERENTIAL/PLATELET
Basophils Absolute: 0.1 10*3/uL (ref 0.0–0.2)
Basos: 1 %
EOS (ABSOLUTE): 0 10*3/uL (ref 0.0–0.4)
Eos: 1 %
Hematocrit: 37.7 % (ref 34.0–46.6)
Hemoglobin: 12.8 g/dL (ref 11.1–15.9)
Immature Grans (Abs): 0 10*3/uL (ref 0.0–0.1)
Immature Granulocytes: 0 %
Lymphocytes Absolute: 2.1 10*3/uL (ref 0.7–3.1)
Lymphs: 36 %
MCH: 29.4 pg (ref 26.6–33.0)
MCHC: 34 g/dL (ref 31.5–35.7)
MCV: 87 fL (ref 79–97)
Monocytes Absolute: 0.4 10*3/uL (ref 0.1–0.9)
Monocytes: 7 %
Neutrophils Absolute: 3.3 10*3/uL (ref 1.4–7.0)
Neutrophils: 55 %
Platelets: 180 10*3/uL (ref 150–450)
RBC: 4.35 x10E6/uL (ref 3.77–5.28)
RDW: 12.7 % (ref 11.7–15.4)
WBC: 6 10*3/uL (ref 3.4–10.8)

## 2021-07-18 LAB — CMP14+EGFR
ALT: 10 IU/L (ref 0–32)
AST: 13 IU/L (ref 0–40)
Albumin/Globulin Ratio: 2.3 — ABNORMAL HIGH (ref 1.2–2.2)
Albumin: 4.6 g/dL (ref 3.8–4.9)
Alkaline Phosphatase: 67 IU/L (ref 44–121)
BUN/Creatinine Ratio: 22 (ref 9–23)
BUN: 17 mg/dL (ref 6–24)
Bilirubin Total: 0.5 mg/dL (ref 0.0–1.2)
CO2: 26 mmol/L (ref 20–29)
Calcium: 9.6 mg/dL (ref 8.7–10.2)
Chloride: 105 mmol/L (ref 96–106)
Creatinine, Ser: 0.79 mg/dL (ref 0.57–1.00)
Globulin, Total: 2 g/dL (ref 1.5–4.5)
Glucose: 82 mg/dL (ref 70–99)
Potassium: 3.8 mmol/L (ref 3.5–5.2)
Sodium: 143 mmol/L (ref 134–144)
Total Protein: 6.6 g/dL (ref 6.0–8.5)
eGFR: 91 mL/min/{1.73_m2} (ref >59)

## 2021-07-18 LAB — LIPID PANEL
Chol/HDL Ratio: 4.9 ratio — ABNORMAL HIGH (ref 0.0–4.4)
Cholesterol, Total: 192 mg/dL (ref 100–199)
HDL: 39 mg/dL — ABNORMAL LOW (ref >39)
LDL Chol Calc (NIH): 128 mg/dL — ABNORMAL HIGH (ref 0–99)
Triglycerides: 141 mg/dL (ref 0–149)
VLDL Cholesterol Cal: 25 mg/dL (ref 5–40)

## 2021-07-21 ENCOUNTER — Telehealth: Payer: Self-pay | Admitting: Nurse Practitioner

## 2021-07-21 NOTE — Telephone Encounter (Signed)
Pt would like for a nurse to call and go over lab results with her. Please call back.

## 2021-08-13 ENCOUNTER — Other Ambulatory Visit: Payer: Self-pay | Admitting: Nurse Practitioner

## 2021-08-13 DIAGNOSIS — F419 Anxiety disorder, unspecified: Secondary | ICD-10-CM

## 2021-08-22 ENCOUNTER — Encounter: Payer: Self-pay | Admitting: Family Medicine

## 2021-08-22 ENCOUNTER — Ambulatory Visit (INDEPENDENT_AMBULATORY_CARE_PROVIDER_SITE_OTHER): Payer: BC Managed Care – PPO | Admitting: Family Medicine

## 2021-08-22 ENCOUNTER — Other Ambulatory Visit: Payer: Self-pay | Admitting: Family Medicine

## 2021-08-22 DIAGNOSIS — J0140 Acute pansinusitis, unspecified: Secondary | ICD-10-CM | POA: Diagnosis not present

## 2021-08-22 DIAGNOSIS — Z8619 Personal history of other infectious and parasitic diseases: Secondary | ICD-10-CM

## 2021-08-22 MED ORDER — PREDNISONE 20 MG PO TABS: ORAL_TABLET | ORAL | 0 refills | Status: DC

## 2021-08-22 MED ORDER — AMOXICILLIN-POT CLAVULANATE 875-125 MG PO TABS: 1.0000 | ORAL_TABLET | Freq: Two times a day (BID) | ORAL | 0 refills | Status: AC

## 2021-08-22 MED ORDER — FLUCONAZOLE 150 MG PO TABS: ORAL_TABLET | ORAL | 0 refills | Status: DC

## 2021-08-22 MED ORDER — FLUTICASONE PROPIONATE 50 MCG/ACT NA SUSP: 2.0000 | Freq: Every day | NASAL | 6 refills | Status: DC

## 2021-08-22 NOTE — Progress Notes (Signed)
Virtual Visit via telephone note Due to COVID-19 pandemic this visit was conducted virtually. This visit type was conducted due to national recommendations for restrictions regarding the COVID-19 Pandemic (e.g. social distancing, sheltering in place) in an effort to limit this patient's exposure and mitigate transmission in our community. All issues noted in this document were discussed and addressed.  A physical exam was not performed with this format.   I connected with Michelle Sellers on 08/22/2021 at 1527 by telephone and verified that I am speaking with the correct person using two identifiers. Michelle Sellers is currently located at home and family is currently with them during visit. The provider, Kari Baars, FNP is located in their office at time of visit.  I discussed the limitations, risks, security and privacy concerns of performing an evaluation and management service by virtual visit and the availability of in person appointments. I also discussed with the patient that there may be a patient responsible charge related to this service. The patient expressed understanding and agreed to proceed.  Subjective:  Patient ID: Michelle Sellers, female    DOB: May 18, 1971, 50 y.o.   MRN: [id number]  Chief Complaint:  Sinusitis   HPI: Michelle Sellers is a 50 y.o. female presenting on 08/22/2021 for Sinusitis   Patient reports ongoing and worsening frontal and maxillary sinus pressure with postnasal drainage, rhinorrhea, headaches, and malaise.  States she has been using Flonase, Sudafed, and Mucinex without relief of symptoms.  States symptoms come on all of a sudden and seem worse than prior upper respiratory infections she has had.  No weakness or confusion.  Has had chills but has not checked temperature.  Sinusitis This is a new problem. The current episode started in the past 7 days. The problem has been rapidly worsening since onset. Her pain is at a severity of 7/10.  The pain is moderate. Associated symptoms include chills, congestion, coughing, headaches, sinus pressure and a sore throat. Pertinent negatives include no diaphoresis, ear pain, hoarse voice, neck pain, shortness of breath, sneezing or swollen glands. Past treatments include oral decongestants and spray decongestants. The treatment provided no relief.    Relevant past medical, surgical, family, and social history reviewed and updated as indicated.  Allergies and medications reviewed and updated.   Past Medical History:  Diagnosis Date   Allergy    Anxiety    Chronic kidney disease    kidney stones   History of kidney stones    Menorrhagia with regular cycle 08/06/2014   Peri-menopause 08/06/2014    Past Surgical History:  Procedure Laterality Date   DILITATION & CURRETTAGE/HYSTROSCOPY WITH NOVASURE ABLATION N/A 07/21/2016   Procedure: DILATATION & CURETTAGE/HYSTEROSCOPY WITH NOVASURE ENDOMETRIAL ABLATION;  Surgeon: [name], MD;  Location: [facility];  Service: Gynecology;  Laterality: N/A;   EXTRACORPOREAL SHOCK WAVE LITHOTRIPSY Right 03/01/2017   Procedure: RIGHT EXTRACORPOREAL SHOCK WAVE LITHOTRIPSY (ESWL);  Surgeon: Jerilee Field, MD;  Location: [facility];  Service: Urology;  Laterality: Right;   EXTRACORPOREAL SHOCK WAVE LITHOTRIPSY Right 04/05/2017   Procedure: RIGHT EXTRACORPOREAL SHOCK WAVE LITHOTRIPSY (ESWL);  Surgeon: Heloise Purpura, MD;  Location: [facility];  Service: Urology;  Laterality: Right;   POLYPECTOMY N/A 07/21/2016   Procedure: REMOVAL OF ENDOMETRIAL POLYP;  Surgeon: [name], MD;  Location: [facility];  Service: Gynecology;  Laterality: N/A;   WISDOM TOOTH EXTRACTION      Social History   Socioeconomic History   Marital status: Married    Spouse name: Not on file  Number of children: Not on file   Years of education: Not on file   Highest education level: Not on file  Occupational History   Not on file  Tobacco Use   Smoking status: Never    Smokeless tobacco: Never  Vaping Use   Vaping Use: Never used  Substance and Sexual Activity   Alcohol use: Yes    Alcohol/week: 1.0 standard drink    Types: 1 Glasses of wine per week    Comment: occ wine   Drug use: No   Sexual activity: Yes    Birth control/protection: Surgical    Comment: ablation  Other Topics Concern   Not on file  Social History Narrative   Not on file   Social Determinants of Health   Financial Resource Strain: Low Risk    Difficulty of Paying Living Expenses: Not hard at all  Food Insecurity: No Food Insecurity   Worried About Programme researcher, broadcasting/film/video in the Last Year: Never true   Ran Out of Food in the Last Year: Never true  Transportation Needs: No Transportation Needs   Lack of Transportation (Medical): No   Lack of Transportation (Non-Medical): No  Physical Activity: Insufficiently Active   Days of Exercise per Week: 2 days   Minutes of Exercise per Session: 60 min  Stress: Stress Concern Present   Feeling of Stress : To some extent  Social Connections: Moderately Integrated   Frequency of Communication with Friends and Family: Three times a week   Frequency of Social Gatherings with Friends and Family: Twice a week   Attends Religious Services: More than 4 times per year   Active Member of [employer] or Organizations: No   Attends Banker Meetings: Never   Marital Status: Married  Catering manager Violence: Not At Risk   Fear of Current or Ex-Partner: No   Emotionally Abused: No   Physically Abused: No   Sexually Abused: No    Outpatient Encounter Medications as of 08/22/2021  Medication Sig   amoxicillin-clavulanate (AUGMENTIN) 875-125 MG tablet Take 1 tablet by mouth 2 (two) times daily for 10 days.   fluconazole (DIFLUCAN) 150 MG tablet 1 po q week x 4 weeks   fluticasone (FLONASE) 50 MCG/ACT nasal spray Place 2 sprays into both nostrils daily.   predniSONE (DELTASONE) 20 MG tablet 2 po at sametime daily for 5 days- start  tomorrow   escitalopram (LEXAPRO) 10 MG tablet TAKE 1 TABLET BY MOUTH EVERY DAY   fexofenadine (ALLEGRA) 180 MG tablet Take 180 mg by mouth daily.   hydrochlorothiazide (HYDRODIURIL) 25 MG tablet Take 1 tablet (25 mg total) by mouth daily.   No facility-administered encounter medications on file as of 08/22/2021.    No Known Allergies  Review of Systems  Constitutional:  Positive for activity change, chills and fatigue. Negative for appetite change, diaphoresis, fever and unexpected weight change.  HENT:  Positive for congestion, postnasal drip, rhinorrhea, sinus pressure, sinus pain and sore throat. Negative for ear pain, hoarse voice, sneezing, tinnitus, trouble swallowing and voice change.   Respiratory:  Positive for cough. Negative for apnea, choking, chest tightness, shortness of breath, wheezing and stridor.   Cardiovascular:  Negative for chest pain, palpitations and leg swelling.  Genitourinary:  Negative for decreased urine volume.  Musculoskeletal:  Negative for back pain, myalgias and neck pain.  Neurological:  Positive for headaches. Negative for dizziness, tremors, seizures, syncope, facial asymmetry, speech difficulty, weakness, light-headedness and numbness.  Psychiatric/Behavioral:  Negative for confusion.  All other systems reviewed and are negative.       Observations/Objective: No vital signs or physical exam, this was a virtual health encounter.  Pt alert and oriented, answers all questions appropriately, and able to speak in full sentences.    Assessment and Plan: Michelle Sellers was seen today for sinusitis.  Diagnoses and all orders for this visit:  Acute non-recurrent pansinusitis Symptoms consistent with acute bacterial sinusitis.  Will initiate Flonase, prednisone, and Augmentin as prescribed.  Continue Mucinex with plenty of fluids.  Tylenol or Advil as needed for fever and pain control.  Report any new, worsening, persistent symptoms.  Follow-up as needed. -      fluticasone (FLONASE) 50 MCG/ACT nasal spray; Place 2 sprays into both nostrils daily. -     predniSONE (DELTASONE) 20 MG tablet; 2 po at sametime daily for 5 days- start tomorrow -     amoxicillin-clavulanate (AUGMENTIN) 875-125 MG tablet; Take 1 tablet by mouth 2 (two) times daily for 10 days.  History of candidiasis Patient reports frequent history of vaginal candidiasis with antibiotic use.  Will prescribe Diflucan.  Patient aware of symptoms which require treatment and willing to initiate treatment. -     fluconazole (DIFLUCAN) 150 MG tablet; 1 po q week x 4 weeks     Follow Up Instructions: Return if symptoms worsen or fail to improve.    I discussed the assessment and treatment plan with the patient. The patient was provided an opportunity to ask questions and all were answered. The patient agreed with the plan and demonstrated an understanding of the instructions.   The patient was advised to call back or seek an in-person evaluation if the symptoms worsen or if the condition fails to improve as anticipated.  The above assessment and management plan was discussed with the patient. The patient verbalized understanding of and has agreed to the management plan. Patient is aware to call the clinic if they develop any new symptoms or if symptoms persist or worsen. Patient is aware when to return to the clinic for a follow-up visit. Patient educated on when it is appropriate to go to the emergency department.    I provided 15 minutes of time during this telephone encounter.   Kari Baars, FNP-C [facility] [address] [place], [place] [address] [phone] 08/22/2021

## 2021-08-28 ENCOUNTER — Encounter: Payer: Self-pay | Admitting: Family Medicine

## 2021-08-28 ENCOUNTER — Ambulatory Visit (INDEPENDENT_AMBULATORY_CARE_PROVIDER_SITE_OTHER): Payer: BC Managed Care – PPO

## 2021-08-28 ENCOUNTER — Ambulatory Visit: Payer: BC Managed Care – PPO | Admitting: Family Medicine

## 2021-08-28 VITALS — BP 141/93 | HR 54 | Temp 97.2°F | Ht 65.0 in | Wt 172.4 lb

## 2021-08-28 DIAGNOSIS — R0602 Shortness of breath: Secondary | ICD-10-CM | POA: Diagnosis not present

## 2021-08-28 DIAGNOSIS — J988 Other specified respiratory disorders: Secondary | ICD-10-CM

## 2021-08-28 DIAGNOSIS — R030 Elevated blood-pressure reading, without diagnosis of hypertension: Secondary | ICD-10-CM | POA: Diagnosis not present

## 2021-08-28 DIAGNOSIS — I517 Cardiomegaly: Secondary | ICD-10-CM | POA: Diagnosis not present

## 2021-08-28 MED ORDER — AZITHROMYCIN 250 MG PO TABS: ORAL_TABLET | ORAL | 0 refills | Status: DC

## 2021-08-28 MED ORDER — METHYLPREDNISOLONE 4 MG PO TBPK: ORAL_TABLET | ORAL | 0 refills | Status: DC

## 2021-08-28 MED ORDER — ALBUTEROL SULFATE HFA 108 (90 BASE) MCG/ACT IN AERS: 2.0000 | INHALATION_SPRAY | Freq: Four times a day (QID) | RESPIRATORY_TRACT | 2 refills | Status: DC | PRN

## 2021-08-28 NOTE — Progress Notes (Signed)
Assessment & Plan:  1. Respiratory infection - azithromycin (ZITHROMAX Z-PAK) 250 MG tablet; Take 2 tablets (500 mg) PO today, then 1 tablet (250 mg) PO daily x4 days.  Dispense: 6 tablet; Refill: 0 - methylPREDNISolone (MEDROL DOSEPAK) 4 MG TBPK tablet; Use as directed.  Dispense: 21 each; Refill: 0  2. Shortness of breath - DG Chest 2 View - albuterol (VENTOLIN HFA) 108 (90 Base) MCG/ACT inhaler; Inhale 2 puffs into the lungs every 6 (six) hours as needed.  Dispense: 18 g; Refill: 2  3. Elevated blood pressure reading in office without diagnosis of hypertension - encouraged to take BP at home and keep a log and report to PCP if getting readings consistently high.    Follow up plan: Return if symptoms worsen or fail to improve.  Floy Sabina, NP Student  I personally was present during the history, physical exam, and medical decision-making activities of this service and have verified that the service and findings are accurately documented in the nurse practitioner student's note.  Deliah Boston, MSN, APRN, FNP-C [facility]  Subjective:   Patient ID: Michelle Sellers, female    DOB: June 15, 1971, 50 y.o.   MRN: [id number]  HPI: [name] is a 50 y.o. female presenting on 08/28/2021 for chest congestion , Shortness of Breath, and Cough (Had a visit on 12/16 and now patient states she is having chest congestion and sob )  She was seen via virtual visit on 12/16 for acute bacterial sinusitis and given Flonase, prednisone, and Augmentin. She was also taking mucinex, tylenol, and ibuprofen prior to that appointment. Today she reports decreased appetite, chest congestion, shortness of breath with activity. She reports her upper respiratory symptoms are slightly better but now its mostly in her chest. She does not have an albuterol inhaler.   She states her BP is higher than her normal and she is concerned about this as well. She takes HCTZ for edema,  but has not taken it yet today. She does not check her BP regularly at home but is able to. She is only slightly elevated at 141/39 today.    ROS: Negative unless specifically indicated above in HPI.   Relevant past medical history reviewed and updated as indicated.   Allergies and medications reviewed and updated.   Current Outpatient Medications:    amoxicillin-clavulanate (AUGMENTIN) 875-125 MG tablet, Take 1 tablet by mouth 2 (two) times daily for 10 days., Disp: 20 tablet, Rfl: 0   escitalopram (LEXAPRO) 10 MG tablet, TAKE 1 TABLET BY MOUTH EVERY DAY, Disp: 90 tablet, Rfl: 1   fexofenadine (ALLEGRA) 180 MG tablet, Take 180 mg by mouth daily., Disp: , Rfl:    fluticasone (FLONASE) 50 MCG/ACT nasal spray, SPRAY 2 SPRAYS INTO EACH NOSTRIL EVERY DAY, Disp: 16 mL, Rfl: 6   hydrochlorothiazide (HYDRODIURIL) 25 MG tablet, Take 1 tablet (25 mg total) by mouth daily., Disp: 90 tablet, Rfl: 1   fluconazole (DIFLUCAN) 150 MG tablet, 1 po q week x 4 weeks (Patient not taking: Reported on 08/28/2021), Disp: 4 tablet, Rfl: 0  No Known Allergies  Objective:   BP (!) 141/93    Pulse (!) 54    Temp (!) 97.2 F (36.2 C) (Temporal)    Ht 5\' 5"  (1.651 m)    Wt 78.2 kg    SpO2 99%    BMI 28.69 kg/m    Physical Exam Vitals reviewed.  Constitutional:      General: She is not in acute distress.  Appearance: Normal appearance. She is ill-appearing. She is not toxic-appearing or diaphoretic.  HENT:     Head: Normocephalic and atraumatic.     Nose: Congestion present.     Right Sinus: Maxillary sinus tenderness present.     Left Sinus: Maxillary sinus tenderness present.     Mouth/Throat:     Mouth: Mucous membranes are moist.     Pharynx: Oropharynx is clear.  Eyes:     Extraocular Movements: Extraocular movements intact.     Conjunctiva/sclera: Conjunctivae normal.     Pupils: Pupils are equal, round, and reactive to light.  Cardiovascular:     Rate and Rhythm: Normal rate and regular  rhythm.     Pulses: Normal pulses.     Heart sounds: Normal heart sounds.  Pulmonary:     Effort: Pulmonary effort is normal. No respiratory distress.     Breath sounds: Wheezing present.  Chest:     Chest wall: Tenderness present. There is dullness to percussion.  Abdominal:     General: There is no distension.     Palpations: Abdomen is soft. There is no mass.  Musculoskeletal:        General: Normal range of motion.     Cervical back: Normal range of motion. Muscular tenderness present.  Skin:    General: Skin is warm and dry.     Capillary Refill: Capillary refill takes less than 2 seconds.  Neurological:     General: No focal deficit present.     Mental Status: She is alert and oriented to person, place, and time.     Motor: No weakness.     Gait: Gait normal.  Psychiatric:        Mood and Affect: Mood normal.        Behavior: Behavior normal.        Thought Content: Thought content normal.        Judgment: Judgment normal.

## 2021-12-30 ENCOUNTER — Other Ambulatory Visit: Payer: Self-pay | Admitting: Family Medicine

## 2021-12-30 DIAGNOSIS — Z8619 Personal history of other infectious and parasitic diseases: Secondary | ICD-10-CM

## 2022-01-19 ENCOUNTER — Ambulatory Visit: Payer: BC Managed Care – PPO | Admitting: Nurse Practitioner

## 2022-01-29 ENCOUNTER — Encounter: Payer: Self-pay | Admitting: Nurse Practitioner

## 2022-01-29 ENCOUNTER — Ambulatory Visit: Payer: BC Managed Care – PPO | Admitting: Nurse Practitioner

## 2022-01-29 ENCOUNTER — Other Ambulatory Visit: Payer: Self-pay

## 2022-01-29 VITALS — BP 109/81 | HR 65 | Temp 97.9°F | Resp 20 | Ht 65.0 in | Wt 163.0 lb

## 2022-01-29 DIAGNOSIS — E7800 Pure hypercholesterolemia, unspecified: Secondary | ICD-10-CM | POA: Diagnosis not present

## 2022-01-29 DIAGNOSIS — F419 Anxiety disorder, unspecified: Secondary | ICD-10-CM | POA: Diagnosis not present

## 2022-01-29 DIAGNOSIS — Z6830 Body mass index (BMI) 30.0-30.9, adult: Secondary | ICD-10-CM

## 2022-01-29 DIAGNOSIS — Z8619 Personal history of other infectious and parasitic diseases: Secondary | ICD-10-CM

## 2022-01-29 DIAGNOSIS — Z780 Asymptomatic menopausal state: Secondary | ICD-10-CM

## 2022-01-29 DIAGNOSIS — J0140 Acute pansinusitis, unspecified: Secondary | ICD-10-CM

## 2022-01-29 DIAGNOSIS — I10 Essential (primary) hypertension: Secondary | ICD-10-CM

## 2022-01-29 DIAGNOSIS — J301 Allergic rhinitis due to pollen: Secondary | ICD-10-CM

## 2022-01-29 LAB — CBC WITH DIFFERENTIAL/PLATELET
Basophils Absolute: 0.1 10*3/uL (ref 0.0–0.2)
Basos: 1 %
EOS (ABSOLUTE): 0.1 10*3/uL (ref 0.0–0.4)
Eos: 1 %
Hematocrit: 39.6 % (ref 34.0–46.6)
Hemoglobin: 13.5 g/dL (ref 11.1–15.9)
Immature Grans (Abs): 0 10*3/uL (ref 0.0–0.1)
Immature Granulocytes: 0 %
Lymphocytes Absolute: 1.9 10*3/uL (ref 0.7–3.1)
Lymphs: 29 %
MCH: 29.9 pg (ref 26.6–33.0)
MCHC: 34.1 g/dL (ref 31.5–35.7)
MCV: 88 fL (ref 79–97)
Monocytes Absolute: 0.5 10*3/uL (ref 0.1–0.9)
Monocytes: 7 %
Neutrophils Absolute: 4.2 10*3/uL (ref 1.4–7.0)
Neutrophils: 62 %
Platelets: 189 10*3/uL (ref 150–450)
RBC: 4.51 x10E6/uL (ref 3.77–5.28)
RDW: 12.7 % (ref 11.7–15.4)
WBC: 6.7 10*3/uL (ref 3.4–10.8)

## 2022-01-29 LAB — CMP14+EGFR
ALT: 12 IU/L (ref 0–32)
AST: 13 IU/L (ref 0–40)
Albumin/Globulin Ratio: 1.8 (ref 1.2–2.2)
Albumin: 4.4 g/dL (ref 3.8–4.9)
Alkaline Phosphatase: 69 IU/L (ref 44–121)
BUN/Creatinine Ratio: 22 (ref 9–23)
BUN: 17 mg/dL (ref 6–24)
Bilirubin Total: 0.7 mg/dL (ref 0.0–1.2)
CO2: 26 mmol/L (ref 20–29)
Calcium: 9.6 mg/dL (ref 8.7–10.2)
Chloride: 104 mmol/L (ref 96–106)
Creatinine, Ser: 0.76 mg/dL (ref 0.57–1.00)
Globulin, Total: 2.4 g/dL (ref 1.5–4.5)
Glucose: 91 mg/dL (ref 70–99)
Potassium: 3.7 mmol/L (ref 3.5–5.2)
Sodium: 143 mmol/L (ref 134–144)
Total Protein: 6.8 g/dL (ref 6.0–8.5)
eGFR: 94 mL/min/{1.73_m2} (ref >59)

## 2022-01-29 LAB — LIPID PANEL
Chol/HDL Ratio: 4.3 ratio (ref 0.0–4.4)
Cholesterol, Total: 184 mg/dL (ref 100–199)
HDL: 43 mg/dL (ref >39)
LDL Chol Calc (NIH): 120 mg/dL — ABNORMAL HIGH (ref 0–99)
Triglycerides: 115 mg/dL (ref 0–149)
VLDL Cholesterol Cal: 21 mg/dL (ref 5–40)

## 2022-01-29 MED ORDER — FLUCONAZOLE 150 MG PO TABS: ORAL_TABLET | ORAL | 1 refills | Status: DC

## 2022-01-29 MED ORDER — FLUTICASONE PROPIONATE 50 MCG/ACT NA SUSP: NASAL | 6 refills | Status: DC

## 2022-01-29 MED ORDER — HYDROCHLOROTHIAZIDE 25 MG PO TABS: 25.0000 mg | ORAL_TABLET | Freq: Every day | ORAL | 1 refills | Status: DC

## 2022-01-29 NOTE — Progress Notes (Signed)
Subjective:    Patient ID: Michelle Sellers, female    DOB: 1971-03-28, 51 y.o.   MRN: [id number]   Chief Complaint: medical management of chronic issues     HPI:  Michelle Sellers is a 51 y.o. who identifies as a female who was assigned female at birth.   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 Primary hypertension No c/o chest pain, sob or headache. Does not check blood pressure at home. BP Readings from Last 3 Encounters:  08/28/21 (!) 141/93  07/17/21 110/79  03/18/21 117/87     2. Pure hypercholesterolemia Does try  to watch diet and stays very active. Does no dedicated exercise. Lab Results  Component Value Date   CHOL 192 07/17/2021   HDL 39 (L) 07/17/2021   LDLCALC 128 (H) 07/17/2021   TRIG 141 07/17/2021   CHOLHDL 4.9 (H) 07/17/2021   The 10-year ASCVD risk score (Arnett DK, et al., 2019) is: 2%   3. Seasonal allergic rhinitis due to pollen Is on flonase daily and is doing well  4. Anxiety Is doing well.    01/29/2022   10:04 AM 07/17/2021   10:23 AM 02/28/2021   11:42 AM 02/06/2021    8:59 AM  GAD 7 : Generalized Anxiety Score  Nervous, Anxious, on Edge 0 0 1 1  Control/stop worrying 0 0 0 1  Worry too much - different things 0 0 0 0  Trouble relaxing 0 0 0 0  Restless 0 0 0 0  Easily annoyed or irritable 0 0 0 0  Afraid - awful might happen 0 0 0 0  Total GAD 7 Score 0 0 1 2  Anxiety Difficulty Not difficult at all Not difficult at all Not difficult at all       5. Menopause Is having occasional hot flashes.   6. BMI 30.0-30.9,adult No recent weight changes. Wt Readings from Last 3 Encounters:  01/29/22 163 lb (73.9 kg)  08/28/21 172 lb 6.4 oz (78.2 kg)  07/17/21 169 lb (76.7 kg)   BMI Readings from Last 3 Encounters:  01/29/22 27.12 kg/m  08/28/21 28.69 kg/m  07/17/21 28.12 kg/m      New  complaints: None today  No Known Allergies Outpatient Encounter Medications as of 01/29/2022  Medication Sig   albuterol (VENTOLIN HFA) 108 (90 Base) MCG/ACT inhaler Inhale 2 puffs into the lungs every 6 (six) hours as needed.   azithromycin (ZITHROMAX Z-PAK) 250 MG tablet Take 2 tablets (500 mg) PO today, then 1 tablet (250 mg) PO daily x4 days.   escitalopram (LEXAPRO) 10 MG tablet TAKE 1 TABLET BY MOUTH EVERY DAY   fexofenadine (ALLEGRA) 180 MG tablet Take 180 mg by mouth daily.   fluconazole (DIFLUCAN) 150 MG tablet 1 po q week x 4 weeks (Patient not taking: Reported on 08/28/2021)   fluticasone (FLONASE) 50 MCG/ACT nasal spray SPRAY 2 SPRAYS INTO EACH NOSTRIL EVERY DAY   hydrochlorothiazide (HYDRODIURIL) 25 MG tablet Take 1 tablet (25 mg total) by mouth daily.   methylPREDNISolone (MEDROL DOSEPAK) 4 MG TBPK tablet Use as directed.   No facility-administered encounter medications on file as of 01/29/2022.    Past Surgical History:  Procedure Laterality Date   DILITATION & CURRETTAGE/HYSTROSCOPY WITH NOVASURE ABLATION N/A 07/21/2016   Procedure: DILATATION & CURETTAGE/HYSTEROSCOPY WITH NOVASURE ENDOMETRIAL ABLATION;  Surgeon: Jonnie Kind, MD;  Location: [facility];  Service: Gynecology;  Laterality: N/A;   EXTRACORPOREAL SHOCK WAVE LITHOTRIPSY Right 03/01/2017   Procedure: RIGHT EXTRACORPOREAL SHOCK WAVE LITHOTRIPSY (ESWL);  Surgeon: Festus Aloe, MD;  Location: [facility];  Service: Urology;  Laterality: Right;   EXTRACORPOREAL SHOCK WAVE LITHOTRIPSY Right 04/05/2017   Procedure: RIGHT EXTRACORPOREAL SHOCK WAVE LITHOTRIPSY (ESWL);  Surgeon: Raynelle Bring, MD;  Location: [facility];  Service: Urology;  Laterality: Right;   POLYPECTOMY N/A 07/21/2016   Procedure: REMOVAL OF ENDOMETRIAL POLYP;  Surgeon: Jonnie Kind, MD;  Location: [facility];  Service: Gynecology;  Laterality: N/A;   WISDOM TOOTH EXTRACTION      Family History  Problem Relation Age of Onset   Hyperlipidemia Mother     Hypertension Mother    Cancer Mother        breast   Breast cancer Mother    Heart disease Father    Hyperlipidemia Father    Hypertension Father    Hyperlipidemia Maternal Grandmother    Breast cancer Maternal Grandmother    Hyperlipidemia Maternal Grandfather    Hyperlipidemia Paternal Grandmother    Hyperlipidemia Paternal Grandfather       Controlled substance contract: n/a     Review of Systems  Constitutional:  Negative for diaphoresis.  Eyes:  Negative for pain.  Respiratory:  Negative for shortness of breath.   Cardiovascular:  Negative for chest pain, palpitations and leg swelling.  Gastrointestinal:  Negative for abdominal pain.  Endocrine: Negative for polydipsia.  Skin:  Negative for rash.  Neurological:  Negative for dizziness, weakness and headaches.  Hematological:  Does not bruise/bleed easily.  All other systems reviewed and are negative.     Objective:   Physical Exam Vitals and nursing note reviewed.  Constitutional:      General: She is not in acute distress.    Appearance: Normal appearance. She is well-developed.  HENT:     Head: Normocephalic.     Right Ear: Tympanic membrane normal.     Left Ear: Tympanic membrane normal.     Nose: Nose normal.     Mouth/Throat:     Mouth: Mucous membranes are moist.  Eyes:     Pupils: Pupils are equal, round, and reactive to light.  Neck:     Vascular: No carotid bruit or JVD.  Cardiovascular:     Rate and Rhythm: Normal rate and regular rhythm.     Heart sounds: Normal heart sounds.  Pulmonary:     Effort: Pulmonary effort is normal. No respiratory distress.     Breath sounds: Normal breath sounds. No wheezing or rales.  Chest:     Chest wall: No tenderness.  Abdominal:     General: Bowel sounds are normal. There is no distension or abdominal bruit.     Palpations: Abdomen is soft. There is no hepatomegaly, splenomegaly, mass or pulsatile mass.     Tenderness: There is no abdominal tenderness.   Musculoskeletal:        General: Normal range of motion.     Cervical back: Normal range of motion and neck supple.  Lymphadenopathy:     Cervical: No cervical adenopathy.  Skin:    General: Skin is warm and dry.  Neurological:     Mental Status: She is alert and oriented to person, place, and time.     Deep Tendon Reflexes: Reflexes are normal and symmetric.  Psychiatric:        Behavior: Behavior normal.        Thought Content: Thought content normal.  Judgment: Judgment normal.    BP 109/81   Pulse 65   Temp 97.9 F (36.6 C) (Temporal)   Resp 20   Ht 5' 5"  (1.651 m)   Wt 163 lb (73.9 kg)   SpO2 97%   BMI 27.12 kg/m        Assessment & Plan:   Michelle Sellers comes in today with chief complaint of Right heel pain and Medical Management of Chronic Issues   Diagnosis and orders addressed:  1. Primary hypertension Low sodium diet - hydrochlorothiazide (HYDRODIURIL) 25 MG tablet; Take 1 tablet (25 mg total) by mouth daily.  Dispense: 90 tablet; Refill: 1 - CBC with Differential/Platelet - CMP14+EGFR  2. Pure hypercholesterolemia Low fat diet - Lipid panel  3. Seasonal allergic rhinitis due to pollen Continue flonase  fluticasone (FLONASE) 50 MCG/ACT nasal spray; SPRAY 2 SPRAYS INTO EACH NOSTRIL EVERY DAY  Dispense: 16 mL; Refill: 6  4. Anxiety Stress management  5. Menopause Black kohash OTC as needed  6. BMI 30.0-30.9,adult Discussed diet and exercise for person with BMI >25 Will recheck weight in 3-6 months   Encouraged to do cologuard that was previously ordered Labs pending Health Maintenance reviewed Diet and exercise encouraged  Follow up plan: 6 months   [place], FNP

## 2022-01-29 NOTE — Patient Instructions (Signed)
  Cologuard  Your provider has prescribed Cologuard, an easy-to-use, noninvasive test for colon cancer screening, based on the latest advances in stool DNA science.   Here's what will happen next:  1. You may receive a call or email from [employer] to confirm your mailing address and insurance information 2. Your kit will be shipped directly to you 3. You collect your stool sample in the privacy of your own home. Please follow the instructions that come with the kit 4. You return the kit via [employer] shipping or pick-up, in the same box it arrived in 5. You should receive a call with the results once they are available. If you do not receive a call, please contact our office at [phone]  Insurance Coverage  Cologuard is covered by Medicare and most major insurers Cologuard is covered by Medicare and Medicare Advantage with no co-pay or deductible for eligible patients ages 32-85. [employer], more than 94% of Cologuard patients have no out-of-pocket cost for screening. Based on the [place] should be covered by most private insurers with no co-pay or deductible for eligible patients (ages 70-75; at average risk for colon cancer; without symptoms). Currently, ~74% of Cologuard patients 45-49 have had no out-of-pocket cost for screening.  Many national and regional payers have begun paying for CRC screening at 98. [employer] continues to work with payers to expand coverage and access for patients ages 75-49.   Only your healthcare insurance provider can confirm how Cologuard will be covered for you. If you have questions about coverage, you can contact your insurance company directly or ask the specialists at [employer] to do that for you. A Customer Support Specialist can be reached at [phone].    Patient Support Screening for colon cancer is very important to your good health, so if you have any questions at all, please call [facility]  Customer Support Specialists at [phone]. They are available 24 hours a day, 6 days a week. An instructional video is available to view online at [email or web address]   *Information and graphics obtained from [email or web address]

## 2022-02-05 ENCOUNTER — Telehealth: Payer: Self-pay | Admitting: Nurse Practitioner

## 2022-02-05 NOTE — Telephone Encounter (Signed)
Patient aware and verbalizes understanding about her lab results.

## 2022-02-05 NOTE — Telephone Encounter (Signed)
Patient would like to discuss lab results. Please call back.

## 2022-02-16 ENCOUNTER — Ambulatory Visit: Payer: BC Managed Care – PPO | Admitting: Family Medicine

## 2022-02-17 ENCOUNTER — Ambulatory Visit: Payer: BC Managed Care – PPO | Admitting: Nurse Practitioner

## 2022-02-17 ENCOUNTER — Ambulatory Visit (INDEPENDENT_AMBULATORY_CARE_PROVIDER_SITE_OTHER): Payer: BC Managed Care – PPO

## 2022-02-17 ENCOUNTER — Encounter: Payer: Self-pay | Admitting: Nurse Practitioner

## 2022-02-17 VITALS — BP 108/82 | HR 70 | Temp 98.1°F | Resp 20 | Ht 65.0 in | Wt 163.0 lb

## 2022-02-17 DIAGNOSIS — N200 Calculus of kidney: Secondary | ICD-10-CM | POA: Diagnosis not present

## 2022-02-17 DIAGNOSIS — K5901 Slow transit constipation: Secondary | ICD-10-CM | POA: Diagnosis not present

## 2022-02-17 DIAGNOSIS — J029 Acute pharyngitis, unspecified: Secondary | ICD-10-CM | POA: Diagnosis not present

## 2022-02-17 DIAGNOSIS — R319 Hematuria, unspecified: Secondary | ICD-10-CM

## 2022-02-17 DIAGNOSIS — R109 Unspecified abdominal pain: Secondary | ICD-10-CM | POA: Diagnosis not present

## 2022-02-17 LAB — URINALYSIS, COMPLETE
Bilirubin, UA: NEGATIVE
Glucose, UA: NEGATIVE
Leukocytes,UA: NEGATIVE
Nitrite, UA: NEGATIVE
Protein,UA: NEGATIVE
RBC, UA: NEGATIVE
Specific Gravity, UA: 1.025 (ref 1.005–1.030)
Urobilinogen, Ur: 0.2 mg/dL (ref 0.2–1.0)
pH, UA: 6 (ref 5.0–7.5)

## 2022-02-17 LAB — MICROSCOPIC EXAMINATION
RBC, Urine: NONE SEEN /hpf (ref 0–2)
Renal Epithel, UA: NONE SEEN /hpf

## 2022-02-17 LAB — RAPID STREP SCREEN (MED CTR MEBANE ONLY): Strep Gp A Ag, IA W/Reflex: NEGATIVE

## 2022-02-17 LAB — CULTURE, GROUP A STREP

## 2022-02-17 NOTE — Patient Instructions (Signed)

## 2022-02-17 NOTE — Progress Notes (Signed)
   Subjective:    Patient ID: Michelle Sellers, female    DOB: 1970-12-05, 51 y.o.   MRN: [id number]  Chief Complaint: hematuria  Hematuria Pertinent negatives include no abdominal pain, chills or fever.   Patient comes in  today with several complaints: - sore throat that started yesterday. Rates 6/10. Hurts to swallow. Has been taking advil. - right flank pain. Started this morning.   Review of Systems  Constitutional:  Negative for chills, diaphoresis and fever.  HENT:  Positive for congestion, sore throat and trouble swallowing.   Eyes:  Negative for pain.  Respiratory:  Negative for shortness of breath.   Cardiovascular:  Negative for chest pain, palpitations and leg swelling.  Gastrointestinal:  Negative for abdominal pain.  Endocrine: Negative for polydipsia.  Genitourinary:  Positive for hematuria.  Skin:  Negative for rash.  Neurological:  Negative for dizziness, weakness and headaches.  Hematological:  Does not bruise/bleed easily.  All other systems reviewed and are negative.      Objective:   Physical Exam Vitals reviewed.  Constitutional:      Appearance: Normal appearance.  HENT:     Right Ear: Tympanic membrane normal.     Left Ear: Tympanic membrane normal.     Nose: Congestion present. No rhinorrhea.     Mouth/Throat:     Pharynx: Posterior oropharyngeal erythema (mild) present. No oropharyngeal exudate.  Cardiovascular:     Rate and Rhythm: Normal rate and regular rhythm.     Heart sounds: Normal heart sounds.  Pulmonary:     Effort: Pulmonary effort is normal.     Breath sounds: Normal breath sounds.  Abdominal:     Tenderness: There is right CVA tenderness (mild). There is no left CVA tenderness.  Skin:    General: Skin is warm.  Neurological:     General: No focal deficit present.     Mental Status: She is alert and oriented to person, place, and time.  Psychiatric:        Mood and Affect: Mood normal.        Behavior: Behavior normal.      BP 108/82   Pulse 70   Temp 98.1 F (36.7 C) (Temporal)   Resp 20   Ht 5\' 5"  (1.651 m)   Wt 163 lb (73.9 kg)   SpO2 94%   BMI 27.12 kg/m    Urine clear KUB- Moderate stool burden on right-Preliminary reading by , FNP  [facility] Strep negative     Assessment & Plan:    Michelle Sellers in today with chief complaint of Hematuria   1. Hematuria, unspecified type - Urinalysis, Complete - DG Abd 1 View  2. Slow transit constipation Milk of magnesia and orune juice' increase fiber in diet Trulance samples given-  1 po prn  3. Sore throat Force fluids Motrin or tylenol OTC OTC decongestant Throat lozenges if help New toothbrush in 3 days  - Rapid Strep Screen ([facility] ONLY)    The above assessment and management plan was discussed with the patient. The patient verbalized understanding of and has agreed to the management plan. Patient is aware to call the clinic if symptoms persist or worsen. Patient is aware when to return to the clinic for a follow-up visit. Patient educated on when it is appropriate to go to the emergency department.   Mary-Margaret [facility], FNP

## 2022-02-18 ENCOUNTER — Telehealth: Payer: Self-pay | Admitting: Nurse Practitioner

## 2022-02-18 MED ORDER — CEFDINIR 300 MG PO CAPS: 300.0000 mg | ORAL_CAPSULE | Freq: Two times a day (BID) | ORAL | 0 refills | Status: DC

## 2022-02-18 NOTE — Telephone Encounter (Signed)
Will send in omnicef to take BID  Meds ordered this encounter  Medications   cefdinir (OMNICEF) 300 MG capsule    Sig: Take 1 capsule (300 mg total) by mouth 2 (two) times daily. 1 po BID    Dispense:  20 capsule    Refill:  0    Order Specific Question:   Supervising Provider    Answer:   Worthy Rancher [id number]   [place], FNP

## 2022-02-18 NOTE — Telephone Encounter (Signed)
Please review and advise.

## 2022-02-18 NOTE — Telephone Encounter (Signed)
Patient notified and verbalized understanding. 

## 2022-02-19 NOTE — Addendum Note (Signed)
Addended by: Bennie Pierini on: 02/19/2022 10:53 AM   Modules accepted: Orders

## 2022-02-20 ENCOUNTER — Telehealth: Payer: Self-pay | Admitting: Nurse Practitioner

## 2022-02-20 NOTE — Telephone Encounter (Signed)
Patient aware and verbalizes understanding. 

## 2022-02-20 NOTE — Telephone Encounter (Signed)
I would do symptom management - throat lozenges, warm salt water gargles, chloraseptic spray, honey, hot tea, Tylenol/Ibuprofen, etc.

## 2022-02-20 NOTE — Telephone Encounter (Signed)
Patient still unable to swallow without it hurting. She was seen 6/13 and given an antibiotic. Can something else be called in? Please call back.

## 2022-02-20 NOTE — Telephone Encounter (Signed)
Veatrice Kells from [facility] calling  Return call if needed to [phone] option 2 then option 3 Needs order for CT scan of abdomin and pelvic to be faxed to [facility] [phone]

## 2022-02-20 NOTE — Telephone Encounter (Signed)
Pt saw MMM given Cefdinir 300mg  bid 10 days  Please review and advise

## 2022-02-23 ENCOUNTER — Ambulatory Visit: Payer: BC Managed Care – PPO | Admitting: Physician Assistant

## 2022-02-23 ENCOUNTER — Ambulatory Visit (HOSPITAL_BASED_OUTPATIENT_CLINIC_OR_DEPARTMENT_OTHER): Payer: BC Managed Care – PPO

## 2022-02-24 ENCOUNTER — Encounter: Payer: Self-pay | Admitting: Physician Assistant

## 2022-02-24 ENCOUNTER — Ambulatory Visit: Payer: BC Managed Care – PPO | Admitting: Physician Assistant

## 2022-02-24 VITALS — BP 119/82 | HR 64 | Temp 97.7°F | Ht 65.0 in | Wt 166.4 lb

## 2022-02-24 DIAGNOSIS — J029 Acute pharyngitis, unspecified: Secondary | ICD-10-CM | POA: Diagnosis not present

## 2022-02-24 NOTE — Progress Notes (Signed)
  Subjective:     Patient ID: Michelle Sellers, female   DOB: 28-Feb-1971, 51 y.o.   MRN: [id number]  Sore Throat  Associated symptoms include ear pain. Pertinent negatives include no trouble swallowing.  Otalgia  Associated symptoms include a sore throat.   Pt here for f/u Prev seen by MMM and started on Omnicef for pharyngitis Still having sx despite on ATB for 1 Week Sx to the R side of the throat only with some radiation to the R ear Using OTC meds and salt water gargles which are helping some  Review of Systems  HENT:  Positive for ear pain and sore throat. Negative for mouth sores, postnasal drip and trouble swallowing.        Objective:   Physical Exam Vitals and nursing note reviewed.  Constitutional:      General: She is not in acute distress.    Appearance: She is not ill-appearing or toxic-appearing.  HENT:     Right Ear: Tympanic membrane and ear canal normal.  Neurological:     Mental Status: She is alert.   + ulceration noted to the superior R tonsil but no enlargement No cerv node     Assessment:     1. Sore throat        Plan:     Discussed nl course with pt Finish course of ATB Warm salt water gargles OTC meds for sx F/U prn PATP

## 2022-02-24 NOTE — Patient Instructions (Signed)

## 2022-03-02 ENCOUNTER — Ambulatory Visit (HOSPITAL_BASED_OUTPATIENT_CLINIC_OR_DEPARTMENT_OTHER): Payer: BC Managed Care – PPO

## 2022-03-13 ENCOUNTER — Ambulatory Visit (HOSPITAL_BASED_OUTPATIENT_CLINIC_OR_DEPARTMENT_OTHER): Payer: BC Managed Care – PPO

## 2022-03-21 ENCOUNTER — Ambulatory Visit (HOSPITAL_BASED_OUTPATIENT_CLINIC_OR_DEPARTMENT_OTHER): Admission: RE | Admit: 2022-03-21 | Payer: BC Managed Care – PPO | Source: Ambulatory Visit

## 2022-04-14 ENCOUNTER — Other Ambulatory Visit (HOSPITAL_BASED_OUTPATIENT_CLINIC_OR_DEPARTMENT_OTHER): Payer: BC Managed Care – PPO

## 2022-05-04 ENCOUNTER — Other Ambulatory Visit: Payer: Self-pay | Admitting: Nurse Practitioner

## 2022-05-04 DIAGNOSIS — R399 Unspecified symptoms and signs involving the genitourinary system: Secondary | ICD-10-CM

## 2022-05-04 MED ORDER — NITROFURANTOIN MONOHYD MACRO 100 MG PO CAPS: 100.0000 mg | ORAL_CAPSULE | Freq: Two times a day (BID) | ORAL | 0 refills | Status: DC

## 2022-05-04 NOTE — Progress Notes (Signed)
UTI sympms for 2 days. Dysuria, frequency and urgency  Meds ordered this encounter  Medications   nitrofurantoin, macrocrystal-monohydrate, (MACROBID) 100 MG capsule    Sig: Take 1 capsule (100 mg total) by mouth 2 (two) times daily.    Dispense:  14 capsule    Refill:  0    Order Specific Question:   Supervising Provider    Answer:   Nils Pyle [[id number]]   Mary-Margaret Daphine Deutscher, FNP

## 2022-05-08 ENCOUNTER — Telehealth: Payer: Self-pay | Admitting: Nurse Practitioner

## 2022-05-14 ENCOUNTER — Ambulatory Visit (HOSPITAL_BASED_OUTPATIENT_CLINIC_OR_DEPARTMENT_OTHER): Payer: BC Managed Care – PPO | Attending: Nurse Practitioner

## 2022-06-10 ENCOUNTER — Encounter: Payer: Self-pay | Admitting: Family Medicine

## 2022-06-10 ENCOUNTER — Ambulatory Visit: Payer: BC Managed Care – PPO | Admitting: Family Medicine

## 2022-06-10 VITALS — BP 114/75 | HR 61 | Temp 98.8°F | Ht 65.0 in | Wt 164.0 lb

## 2022-06-10 DIAGNOSIS — B029 Zoster without complications: Secondary | ICD-10-CM | POA: Diagnosis not present

## 2022-06-10 MED ORDER — VALACYCLOVIR HCL 1 G PO TABS: 1000.0000 mg | ORAL_TABLET | Freq: Three times a day (TID) | ORAL | 0 refills | Status: AC

## 2022-06-10 MED ORDER — VALACYCLOVIR HCL 1 G PO TABS: 1000.0000 mg | ORAL_TABLET | Freq: Three times a day (TID) | ORAL | 0 refills | Status: DC

## 2022-06-10 NOTE — Progress Notes (Signed)
Subjective:  Patient ID: Michelle Sellers, female    DOB: October 14, 1970, 51 y.o.   MRN: [id number]  Patient Care Team: Bennie Pierini, FNP as PCP - General (Family Medicine)   Chief Complaint:  fever blister   HPI: Michelle Sellers is a 51 y.o. female presenting on 06/10/2022 for fever blister   Pt presents today for evaluation of rash to upper lip. Started yesterday. States she woke up today and the rash has worsened and she is not having pruritis and burning to her right face. No visual or hearing changes. No known sick exposures.      Relevant past medical, surgical, family, and social history reviewed and updated as indicated.  Allergies and medications reviewed and updated. Data reviewed: Chart in Epic.   Past Medical History:  Diagnosis Date   Allergy    Anxiety    Chronic kidney disease    kidney stones   History of kidney stones    Menorrhagia with regular cycle 08/06/2014   Peri-menopause 08/06/2014    Past Surgical History:  Procedure Laterality Date   DILITATION & CURRETTAGE/HYSTROSCOPY WITH NOVASURE ABLATION N/A 07/21/2016   Procedure: DILATATION & CURETTAGE/HYSTEROSCOPY WITH NOVASURE ENDOMETRIAL ABLATION;  Surgeon: [name], MD;  Location: [facility];  Service: Gynecology;  Laterality: N/A;   EXTRACORPOREAL SHOCK WAVE LITHOTRIPSY Right 03/01/2017   Procedure: RIGHT EXTRACORPOREAL SHOCK WAVE LITHOTRIPSY (ESWL);  Surgeon: Jerilee Field, MD;  Location: [facility];  Service: Urology;  Laterality: Right;   EXTRACORPOREAL SHOCK WAVE LITHOTRIPSY Right 04/05/2017   Procedure: RIGHT EXTRACORPOREAL SHOCK WAVE LITHOTRIPSY (ESWL);  Surgeon: Heloise Purpura, MD;  Location: [facility];  Service: Urology;  Laterality: Right;   POLYPECTOMY N/A 07/21/2016   Procedure: REMOVAL OF ENDOMETRIAL POLYP;  Surgeon: [name], MD;  Location: [facility];  Service: Gynecology;  Laterality: N/A;   WISDOM TOOTH EXTRACTION      Social History   Socioeconomic History    Marital status: Married    Spouse name: Not on file   Number of children: Not on file   Years of education: Not on file   Highest education level: Not on file  Occupational History   Not on file  Tobacco Use   Smoking status: Never   Smokeless tobacco: Never  Vaping Use   Vaping Use: Never used  Substance and Sexual Activity   Alcohol use: Yes    Alcohol/week: 1.0 standard drink of alcohol    Types: 1 Glasses of wine per week    Comment: occ wine   Drug use: No   Sexual activity: Yes    Birth control/protection: Surgical    Comment: ablation  Other Topics Concern   Not on file  Social History Narrative   Not on file   Social Determinants of Health   Financial Resource Strain: Low Risk  (02/06/2021)   Overall Financial Resource Strain (CARDIA)    Difficulty of Paying Living Expenses: Not hard at all  Food Insecurity: No Food Insecurity (02/06/2021)   Hunger Vital Sign    Worried About Running Out of Food in the Last Year: Never true    Ran Out of Food in the Last Year: Never true  Transportation Needs: No Transportation Needs (02/06/2021)   PRAPARE - Administrator, Civil Service (Medical): No    Lack of Transportation (Non-Medical): No  Physical Activity: Insufficiently Active (02/06/2021)   Exercise Vital Sign    Days of Exercise per Week: 2 days    Minutes of  Exercise per Session: 60 min  Stress: Stress Concern Present (02/06/2021)   [employer] of Occupational Health - Occupational Stress Questionnaire    Feeling of Stress : To some extent  Social Connections: Moderately Integrated (02/06/2021)   Social Connection and Isolation Panel [NHANES]    Frequency of Communication with Friends and Family: Three times a week    Frequency of Social Gatherings with Friends and Family: Twice a week    Attends Religious Services: More than 4 times per year    Active Member of [employer] or Organizations: No    Attends Banker Meetings: Never    Marital Status:  Married  Catering manager Violence: Not At Risk (02/06/2021)   Humiliation, Afraid, Rape, and Kick questionnaire    Fear of Current or Ex-Partner: No    Emotionally Abused: No    Physically Abused: No    Sexually Abused: No    Outpatient Encounter Medications as of 06/10/2022  Medication Sig   fexofenadine (ALLEGRA) 180 MG tablet Take 180 mg by mouth daily.   hydrochlorothiazide (HYDRODIURIL) 25 MG tablet Take 1 tablet (25 mg total) by mouth daily.   [DISCONTINUED] valACYclovir (VALTREX) 1000 MG tablet Take 1 tablet (1,000 mg total) by mouth 3 (three) times daily for 10 days.   valACYclovir (VALTREX) 1000 MG tablet Take 1 tablet (1,000 mg total) by mouth 3 (three) times daily for 10 days.   [DISCONTINUED] albuterol (VENTOLIN HFA) 108 (90 Base) MCG/ACT inhaler Inhale 2 puffs into the lungs every 6 (six) hours as needed.   [DISCONTINUED] cefdinir (OMNICEF) 300 MG capsule Take 1 capsule (300 mg total) by mouth 2 (two) times daily. 1 po BID   [DISCONTINUED] nitrofurantoin, macrocrystal-monohydrate, (MACROBID) 100 MG capsule Take 1 capsule (100 mg total) by mouth 2 (two) times daily.   No facility-administered encounter medications on file as of 06/10/2022.    No Known Allergies  Review of Systems  Constitutional:  Negative for activity change, appetite change, chills, fatigue and fever.  HENT: Negative.    Eyes: Negative.   Respiratory:  Negative for cough, chest tightness and shortness of breath.   Cardiovascular:  Negative for chest pain, palpitations and leg swelling.  Gastrointestinal:  Negative for blood in stool, constipation, diarrhea, nausea and vomiting.  Endocrine: Negative.   Genitourinary:  Negative for dysuria, frequency and urgency.  Musculoskeletal:  Negative for arthralgias and myalgias.  Skin:  Positive for color change and rash.  Allergic/Immunologic: Negative.   Neurological:  Negative for dizziness and headaches.  Hematological: Negative.   Psychiatric/Behavioral:   Negative for confusion, hallucinations, sleep disturbance and suicidal ideas.   All other systems reviewed and are negative.       Objective:  BP 114/75   Pulse 61   Temp 98.8 F (37.1 C)   Ht 5\' 5"  (1.651 m)   Wt 164 lb (74.4 kg)   SpO2 96%   BMI 27.29 kg/m    Wt Readings from Last 3 Encounters:  06/10/22 164 lb (74.4 kg)  02/24/22 166 lb 6.4 oz (75.5 kg)  02/17/22 163 lb (73.9 kg)    Physical Exam Vitals and nursing note reviewed.  Constitutional:      General: She is not in acute distress.    Appearance: Normal appearance. She is not ill-appearing, toxic-appearing or diaphoretic.  HENT:     Head: Normocephalic and atraumatic.     Right Ear: Tympanic membrane, ear canal and external ear normal.     Left Ear: Tympanic membrane, ear canal  and external ear normal.     Nose: Nose normal.     Mouth/Throat:     Mouth: Mucous membranes are moist.     Pharynx: Oropharynx is clear.  Eyes:     Extraocular Movements: Extraocular movements intact.     Conjunctiva/sclera: Conjunctivae normal.     Pupils: Pupils are equal, round, and reactive to light.  Cardiovascular:     Rate and Rhythm: Normal rate and regular rhythm.     Heart sounds: Normal heart sounds.  Pulmonary:     Effort: Pulmonary effort is normal.     Breath sounds: Normal breath sounds.  Skin:    General: Skin is warm.     Capillary Refill: Capillary refill takes less than 2 seconds.     Findings: Erythema, lesion and rash present. Rash is papular and vesicular.     Comments: Grouped edematous pink papules and early vesicles on right upper lip border  Neurological:     General: No focal deficit present.     Mental Status: She is alert and oriented to person, place, and time.  Psychiatric:        Mood and Affect: Mood normal.        Behavior: Behavior normal.        Thought Content: Thought content normal.        Judgment: Judgment normal.     Results for orders placed or performed in visit on 02/17/22   Rapid Strep Screen ([facility] ONLY)   Specimen: Other   Other  Result Value Ref Range   Strep Gp A Ag, IA W/Reflex Negative Negative  Microscopic Examination   Urine  Result Value Ref Range   WBC, UA 0-5 0 - 5 /hpf   RBC, Urine None seen 0 - 2 /hpf   Epithelial Cells (non renal) 0-10 0 - 10 /hpf   Renal Epithel, UA None seen None seen /hpf   Mucus, UA Present (A) Not Estab.   Bacteria, UA Few (A) None seen/Few  Culture, Group A Strep   Other  Result Value Ref Range   Strep A Culture CANCELED   Urinalysis, Complete  Result Value Ref Range   Specific Gravity, UA 1.025 1.005 - 1.030   pH, UA 6.0 5.0 - 7.5   Color, UA Yellow Yellow   Appearance Ur Clear Clear   Leukocytes,UA Negative Negative   Protein,UA Negative Negative/Trace   Glucose, UA Negative Negative   Ketones, UA Trace (A) Negative   RBC, UA Negative Negative   Bilirubin, UA Negative Negative   Urobilinogen, Ur 0.2 0.2 - 1.0 mg/dL   Nitrite, UA Negative Negative   Microscopic Examination See below:        Pertinent labs & imaging results that were available during my care of the patient were reviewed by me and considered in my medical decision making.  Assessment & Plan:  Zoey was seen today for fever blister.  Diagnoses and all orders for this visit:  Herpes zoster without complication Classic herpes zoster rash to right upper lip with pruritis and burning to right face. No eye or ear involvement. Antiviral therapy as prescribed. Red flags discussed in detail with pt. Report new, worsening, or persistent symptoms.  -     valACYclovir (VALTREX) 1000 MG tablet; Take 1 tablet (1,000 mg total) by mouth 3 (three) times daily for 10 days.     Continue all other maintenance medications.  Follow up plan: Return if symptoms worsen or fail to improve.  Continue healthy lifestyle choices, including diet (rich in fruits, vegetables, and lean proteins, and low in salt and simple carbohydrates) and exercise  (at least 30 minutes of moderate physical activity daily).  Educational handout given for shingles  The above assessment and management plan was discussed with the patient. The patient verbalized understanding of and has agreed to the management plan. Patient is aware to call the clinic if they develop any new symptoms or if symptoms persist or worsen. Patient is aware when to return to the clinic for a follow-up visit. Patient educated on when it is appropriate to go to the emergency department.   Monia Pouch, FNP-C [facility] [phone]

## 2022-06-11 ENCOUNTER — Telehealth: Payer: Self-pay | Admitting: Family Medicine

## 2022-06-11 NOTE — Telephone Encounter (Signed)
Pt called stating that she had a visit with Monia Pouch yesterday and was prescribed an antibiotic. Was told to call and let her know if symptoms changed or worsened.   Pt says the right side of her face and under her chin is very swollen and painful to touch.   Wants to make sure she is taking the right antibiotic. Please advise and call patient.

## 2022-06-11 NOTE — Telephone Encounter (Signed)
Contacted patient. Notified patient. Patient verbalized understanding  °

## 2022-07-13 DIAGNOSIS — Z1231 Encounter for screening mammogram for malignant neoplasm of breast: Secondary | ICD-10-CM | POA: Diagnosis not present

## 2022-08-03 ENCOUNTER — Ambulatory Visit: Payer: BC Managed Care – PPO | Admitting: Nurse Practitioner

## 2022-08-03 NOTE — Progress Notes (Deleted)
   Subjective:    Patient ID: Michelle Sellers, female    DOB: 04-24-1971, 51 y.o.   MRN: [id number]   Chief Complaint: medical management of chronic issues   HPI:  Michelle Sellers is a 51 y.o. who identifies as a female who was assigned female at birth.   Social history: Lives with: *** Work history: ***   Comes in today for follow up of the following chronic medical issues:  1. Primary hypertension No c/o chest pain, sob, or headaches. Does *** check BP at home.  2. Pure hypercholesterolemia Does try to watch diet and lives a very active lifestyle; no dedicated exercise. Lab Results  Component Value Date   CHOL 184 01/29/2022   HDL 43 01/29/2022   LDLCALC 120 (H) 01/29/2022   TRIG 115 01/29/2022   CHOLHDL 4.3 01/29/2022   The 10-year ASCVD risk score (Arnett DK, et al., 2019) is: 1.9%   3. Seasonal allergic rhinitis due to pollen Uses flonase daily which helps.  4. Menopause ***Has hot flashes from time to time  5. Anxiety ***  6. BMI 30.0-30.9,adult ***   New complaints: ***  No Known Allergies Outpatient Encounter Medications as of 08/03/2022  Medication Sig   fexofenadine (ALLEGRA) 180 MG tablet Take 180 mg by mouth daily.   hydrochlorothiazide (HYDRODIURIL) 25 MG tablet Take 1 tablet (25 mg total) by mouth daily.   No facility-administered encounter medications on file as of 08/03/2022.    Past Surgical History:  Procedure Laterality Date   DILITATION & CURRETTAGE/HYSTROSCOPY WITH NOVASURE ABLATION N/A 07/21/2016   Procedure: DILATATION & CURETTAGE/HYSTEROSCOPY WITH NOVASURE ENDOMETRIAL ABLATION;  Surgeon: [name], MD;  Location: [facility];  Service: Gynecology;  Laterality: N/A;   EXTRACORPOREAL SHOCK WAVE LITHOTRIPSY Right 03/01/2017   Procedure: RIGHT EXTRACORPOREAL SHOCK WAVE LITHOTRIPSY (ESWL);  Surgeon: Jerilee Field, MD;  Location: [facility];  Service: Urology;  Laterality: Right;   EXTRACORPOREAL SHOCK WAVE LITHOTRIPSY Right  04/05/2017   Procedure: RIGHT EXTRACORPOREAL SHOCK WAVE LITHOTRIPSY (ESWL);  Surgeon: Heloise Purpura, MD;  Location: [facility];  Service: Urology;  Laterality: Right;   POLYPECTOMY N/A 07/21/2016   Procedure: REMOVAL OF ENDOMETRIAL POLYP;  Surgeon: [name], MD;  Location: [facility];  Service: Gynecology;  Laterality: N/A;   WISDOM TOOTH EXTRACTION      Family History  Problem Relation Age of Onset   Hyperlipidemia Mother    Hypertension Mother    Cancer Mother        breast   Breast cancer Mother    Heart disease Father    Hyperlipidemia Father    Hypertension Father    Hyperlipidemia Maternal Grandmother    Breast cancer Maternal Grandmother    Hyperlipidemia Maternal Grandfather    Hyperlipidemia Paternal Grandmother    Hyperlipidemia Paternal Grandfather       Controlled substance contract: ***     Review of Systems     Objective:   Physical Exam        Assessment & Plan:

## 2022-08-04 ENCOUNTER — Encounter: Payer: Self-pay | Admitting: Nurse Practitioner

## 2022-08-05 ENCOUNTER — Encounter: Payer: Self-pay | Admitting: Adult Health

## 2022-08-05 ENCOUNTER — Other Ambulatory Visit (HOSPITAL_COMMUNITY)
Admission: RE | Admit: 2022-08-05 | Discharge: 2022-08-05 | Disposition: A | Discharge status: Home / Self Care | Payer: BC Managed Care – PPO | Source: Ambulatory Visit | Attending: Adult Health | Admitting: Adult Health

## 2022-08-05 ENCOUNTER — Ambulatory Visit (INDEPENDENT_AMBULATORY_CARE_PROVIDER_SITE_OTHER): Payer: BC Managed Care – PPO | Admitting: Adult Health

## 2022-08-05 VITALS — BP 122/86 | HR 69 | Ht 64.5 in | Wt 168.0 lb

## 2022-08-05 DIAGNOSIS — Z1211 Encounter for screening for malignant neoplasm of colon: Secondary | ICD-10-CM

## 2022-08-05 DIAGNOSIS — Z01419 Encounter for gynecological examination (general) (routine) without abnormal findings: Secondary | ICD-10-CM | POA: Insufficient documentation

## 2022-08-05 LAB — HEMOCCULT GUIAC POC 1CARD (OFFICE): Fecal Occult Blood, POC: NEGATIVE

## 2022-08-05 NOTE — Progress Notes (Signed)
Patient ID: Michelle Sellers, female   DOB: [birth year], 51 y.o.   MRN: [id number] History of Present Illness: Michelle Sellers is a 51 year old white female,married, G2P2 in for a well woman gyn exam and pap.  PCP is Michelle Pretty NP    Current Medications, Allergies, Past Medical History, Past Surgical History, Family History and Social History were reviewed in [employer] record.     Review of Systems: Patient denies any headaches, hearing loss, fatigue, blurred vision, shortness of breath, chest pain, abdominal pain, problems with bowel movements, urination, or intercourse. No joint pain or mood swings. Does not always sleep well No vaginal bleeding sp ablation    Physical Exam:BP 122/86 (BP Location: Left Arm, Patient Position: Sitting, Cuff Size: Normal)   Pulse 69   Ht 5' 4.5" (1.638 m)   Wt 168 lb (76.2 kg)   BMI 28.39 kg/m   General:  Well developed, well nourished, no acute distress Skin:  Warm and dry Neck:  Midline trachea, normal thyroid, good ROM, no lymphadenopathy Lungs; Clear to auscultation bilaterally Breast:  No dominant palpable mass, retraction, or nipple discharge Cardiovascular: Regular rate and rhythm Abdomen:  Soft, non tender, no hepatosplenomegaly Pelvic:  External genitalia is normal in appearance, no lesions.  The vagina is normal in appearance. Urethra has no lesions or masses. The cervix is smooth, pap with HR HPV genotyping performed.   Uterus is felt to be normal size, shape, and contour.  No adnexal masses or tenderness noted.Bladder is non tender, no masses felt. Rectal: Good sphincter tone, no polyps, or hemorrhoids felt.  Hemoccult negative. Extremities/musculoskeletal:  No swelling or varicosities noted, no clubbing or cyanosis Psych:  No mood changes, alert and cooperative,seems happy AA is 2 Fall risk is low    08/05/2022    9:17 AM 02/17/2022    3:05 PM 01/29/2022   10:04 AM  Depression screen PHQ 2/9  Decreased  Interest 0 0 0  Down, Depressed, Hopeless 0 0 0  PHQ - 2 Score 0 0 0  Altered sleeping 2 0 0  Tired, decreased energy 1 0 0  Change in appetite 0 0 0  Feeling bad or failure about yourself  0 0 0  Trouble concentrating 0 0 0  Moving slowly or fidgety/restless 0 0 0  Suicidal thoughts 0 0 0  PHQ-9 Score 3 0 0  Difficult doing work/chores  Not difficult at all Not difficult at all       08/05/2022    9:17 AM 02/17/2022    3:06 PM 01/29/2022   10:04 AM 07/17/2021   10:23 AM  GAD 7 : Generalized Anxiety Score  Nervous, Anxious, on Edge 1 0 0 0  Control/stop worrying 1 0 0 0  Worry too much - different things 0 0 0 0  Trouble relaxing 0 0 0 0  Restless 0 0 0 0  Easily annoyed or irritable 1 0 0 0  Afraid - awful might happen 0 0 0 0  Total GAD 7 Score 3 0 0 0  Anxiety Difficulty  Not difficult at all Not difficult at all Not difficult at all    Upstream - 08/05/22 0916       Pregnancy Intention Screening   Does the patient want to become pregnant in the next year? No    Does the patient's partner want to become pregnant in the next year? No    Would the patient like to discuss contraceptive options today? No  Contraception Wrap Up   Current Method No Method - Other Reason   ablation   End Method No Method - Other Reason   ablation   Contraception Counseling Provided No            Examination chaperoned by Malachy Mood LPN     Impression and Plan: 1. Encounter for gynecological examination with Papanicolaou smear of cervix Pap sent Pap in 3 years if normal Physical in 1 year Labs wit PCP Mammogram was negative 07/13/22 at [facility]  She is going to do Cologuard  - Cytology - PAP( [place]) Stay active  2. Encounter for screening fecal occult blood testing Hemoccult was negative  - POCT occult blood stool

## 2022-08-06 LAB — CYTOLOGY - PAP
Comment: NEGATIVE
Diagnosis: NEGATIVE
High risk HPV: NEGATIVE

## 2022-08-11 ENCOUNTER — Ambulatory Visit: Payer: BC Managed Care – PPO | Admitting: Nurse Practitioner

## 2022-08-24 ENCOUNTER — Encounter: Payer: Self-pay | Admitting: Nurse Practitioner

## 2022-09-18 ENCOUNTER — Telehealth: Payer: Self-pay

## 2022-09-18 NOTE — Telephone Encounter (Signed)
Patient has been having chest pains for last couple of days with pain radiating down her left arm.  She reports that the pain has worsened today and she feels light headed.  Patient was advised that she should go to the ER for further evaluation.  Patient voices understanding.

## 2022-09-22 ENCOUNTER — Telehealth: Payer: Self-pay

## 2022-09-22 ENCOUNTER — Telehealth: Payer: Self-pay | Admitting: Nurse Practitioner

## 2022-09-22 DIAGNOSIS — N3000 Acute cystitis without hematuria: Secondary | ICD-10-CM | POA: Diagnosis not present

## 2022-09-22 DIAGNOSIS — E876 Hypokalemia: Secondary | ICD-10-CM | POA: Diagnosis not present

## 2022-09-22 DIAGNOSIS — R0789 Other chest pain: Secondary | ICD-10-CM | POA: Diagnosis not present

## 2022-09-22 DIAGNOSIS — R079 Chest pain, unspecified: Secondary | ICD-10-CM | POA: Diagnosis not present

## 2022-09-22 DIAGNOSIS — K219 Gastro-esophageal reflux disease without esophagitis: Secondary | ICD-10-CM | POA: Diagnosis not present

## 2022-09-22 DIAGNOSIS — R0602 Shortness of breath: Secondary | ICD-10-CM | POA: Diagnosis not present

## 2022-09-22 NOTE — Telephone Encounter (Signed)
Chest Pain: Patient calls and complains of chest pain that radiates into left arm with slight nausea and abdominal bloating.. Onset was 2 days ago, with worsening course since that time. The patient describes the pain as intermittent, pressure like in nature, radiates to the left arm. Patient rates pain as a 5/10 in intensity.  Associated symptoms are chest pain and chest pressure/discomfort. Aggravating factors are none.  Alleviating factors are: none. Patient's cardiac risk factors are dyslipidemia.  Patient's risk factors for DVT/PE: none. Previous cardiac testing: none. Patient was advised she should go immediately to the ER or call 911 for assessment.  Patient had called a few days ago with similar symptoms but at that time did not have any nausea.  She was also advised to go to the ER at that time.  Patient states she did not go because she thought maybe symptoms would improve or it was all just in her head.  Patient was told today that she should definitely go so that they could perform labs we are not able to do here and evaluate her for any issues she may be having with her heart.  Patient reports she will go to ER.

## 2022-09-23 ENCOUNTER — Ambulatory Visit: Payer: BC Managed Care – PPO

## 2022-11-06 ENCOUNTER — Encounter: Payer: Self-pay | Admitting: Family Medicine

## 2022-11-06 ENCOUNTER — Other Ambulatory Visit: Payer: Self-pay | Admitting: Family Medicine

## 2022-11-06 ENCOUNTER — Telehealth: Payer: BC Managed Care – PPO | Admitting: Family Medicine

## 2022-11-06 DIAGNOSIS — R102 Pelvic and perineal pain: Secondary | ICD-10-CM | POA: Diagnosis not present

## 2022-11-06 DIAGNOSIS — R399 Unspecified symptoms and signs involving the genitourinary system: Secondary | ICD-10-CM

## 2022-11-06 DIAGNOSIS — N760 Acute vaginitis: Secondary | ICD-10-CM | POA: Diagnosis not present

## 2022-11-06 LAB — URINALYSIS, ROUTINE W REFLEX MICROSCOPIC
Bilirubin, UA: NEGATIVE
Glucose, UA: NEGATIVE
Ketones, UA: NEGATIVE
Leukocytes,UA: NEGATIVE
Nitrite, UA: NEGATIVE
Protein,UA: NEGATIVE
RBC, UA: NEGATIVE
Specific Gravity, UA: 1.02 (ref 1.005–1.030)
Urobilinogen, Ur: 0.2 mg/dL (ref 0.2–1.0)
pH, UA: 6.5 (ref 5.0–7.5)

## 2022-11-06 MED ORDER — FLUCONAZOLE 150 MG PO TABS: ORAL_TABLET | ORAL | 0 refills | Status: DC

## 2022-11-06 NOTE — Progress Notes (Signed)
Virtual Visit  Note Due to COVID-19 pandemic this visit was conducted virtually. This visit type was conducted due to national recommendations for restrictions regarding the COVID-19 Pandemic (e.g. social distancing, sheltering in place) in an effort to limit this patient's exposure and mitigate transmission in our community. All issues noted in this document were discussed and addressed.  A physical exam was not performed with this format.  I connected with Michelle Sellers on 11/06/22 at 12:59 by telephone and verified that I am speaking with the correct person using two identifiers. Michelle Sellers is currently located at work and no one is currently with her during the visit. The provider, Gwenlyn Perking, FNP is located in their office at time of visit.  I discussed the limitations, risks, security and privacy concerns of performing an evaluation and management service by telephone and the availability of in person appointments. I also discussed with the patient that there may be a patient responsible charge related to this service. The patient expressed understanding and agreed to proceed.  CC: UTI symptoms  History and Present Illness:  Urinary Tract Infection  This is a new problem. The current episode started in the past 7 days. The problem occurs intermittently. The problem has been gradually worsening. The quality of the pain is described as aching. There has been no fever. Associated symptoms include a discharge (white, thick) and urgency. Pertinent negatives include no chills, flank pain, frequency, hematuria, nausea or vomiting. Associated symptoms comments: Vaginal itching. She has tried acetaminophen and increased fluids for the symptoms. The treatment provided no relief. Her past medical history is significant for kidney stones. There is no history of recurrent UTIs.    Review of Systems  Constitutional:  Negative for chills and fever.  Gastrointestinal:  Negative for  blood in stool, constipation, diarrhea, melena, nausea and vomiting.  Genitourinary:  Positive for urgency. Negative for dysuria, flank pain, frequency and hematuria.  Musculoskeletal:  Negative for back pain.     Observations/Objective: Alert and oriented x 3. Able to speak in full sentences without difficulty.   Urine dipstick shows negative for all components.    Assessment and Plan: Michelle Sellers was seen today for urinary tract infection.  Diagnoses and all orders for this visit:  Suprapubic pressure Negative UA. Urine culture pending. Increase hydration. Will treat with vaginitis with diflucan as she reports vaginal itching and white discharge. Discussed possibility of kidney stones. Discussed to go to ER for new or worsening symptoms over the weekend.    Acute vaginitis -     fluconazole (DIFLUCAN) 150 MG tablet; Take one tablet by mouth now. May repeat in 3 days if symptoms persist.     Follow Up Instructions: As needed.     I discussed the assessment and treatment plan with the patient. The patient was provided an opportunity to ask questions and all were answered. The patient agreed with the plan and demonstrated an understanding of the instructions.   The patient was advised to call back or seek an in-person evaluation if the symptoms worsen or if the condition fails to improve as anticipated.  The above assessment and management plan was discussed with the patient. The patient verbalized understanding of and has agreed to the management plan. Patient is aware to call the clinic if symptoms persist or worsen. Patient is aware when to return to the clinic for a follow-up visit. Patient educated on when it is appropriate to go to the emergency department.   Time call ended:  1310    I provided 11 minutes of  non face-to-face time during this encounter.    Gwenlyn Perking, FNP

## 2022-11-09 LAB — URINE CULTURE

## 2022-11-19 ENCOUNTER — Ambulatory Visit (INDEPENDENT_AMBULATORY_CARE_PROVIDER_SITE_OTHER): Payer: BC Managed Care – PPO

## 2022-11-19 ENCOUNTER — Ambulatory Visit (INDEPENDENT_AMBULATORY_CARE_PROVIDER_SITE_OTHER): Payer: BC Managed Care – PPO | Admitting: Nurse Practitioner

## 2022-11-19 ENCOUNTER — Encounter: Payer: Self-pay | Admitting: Nurse Practitioner

## 2022-11-19 VITALS — BP 115/79 | HR 70 | Temp 96.8°F | Resp 20 | Ht 64.0 in | Wt 164.0 lb

## 2022-11-19 DIAGNOSIS — R1032 Left lower quadrant pain: Secondary | ICD-10-CM

## 2022-11-19 DIAGNOSIS — I10 Essential (primary) hypertension: Secondary | ICD-10-CM

## 2022-11-19 DIAGNOSIS — Z0001 Encounter for general adult medical examination with abnormal findings: Secondary | ICD-10-CM | POA: Diagnosis not present

## 2022-11-19 DIAGNOSIS — Z0000 Encounter for general adult medical examination without abnormal findings: Secondary | ICD-10-CM | POA: Diagnosis not present

## 2022-11-19 DIAGNOSIS — E7800 Pure hypercholesterolemia, unspecified: Secondary | ICD-10-CM | POA: Diagnosis not present

## 2022-11-19 DIAGNOSIS — Z6830 Body mass index (BMI) 30.0-30.9, adult: Secondary | ICD-10-CM

## 2022-11-19 DIAGNOSIS — F419 Anxiety disorder, unspecified: Secondary | ICD-10-CM

## 2022-11-19 DIAGNOSIS — R109 Unspecified abdominal pain: Secondary | ICD-10-CM | POA: Diagnosis not present

## 2022-11-19 DIAGNOSIS — Z0389 Encounter for observation for other suspected diseases and conditions ruled out: Secondary | ICD-10-CM | POA: Diagnosis not present

## 2022-11-19 DIAGNOSIS — R3989 Other symptoms and signs involving the genitourinary system: Secondary | ICD-10-CM | POA: Diagnosis not present

## 2022-11-19 DIAGNOSIS — R7989 Other specified abnormal findings of blood chemistry: Secondary | ICD-10-CM

## 2022-11-19 LAB — URINALYSIS, COMPLETE
Bilirubin, UA: NEGATIVE
Glucose, UA: NEGATIVE
Leukocytes,UA: NEGATIVE
Nitrite, UA: NEGATIVE
Protein,UA: NEGATIVE
RBC, UA: NEGATIVE
Specific Gravity, UA: 1.02 (ref 1.005–1.030)
Urobilinogen, Ur: 0.2 mg/dL (ref 0.2–1.0)
pH, UA: 6.5 (ref 5.0–7.5)

## 2022-11-19 LAB — MICROSCOPIC EXAMINATION
Bacteria, UA: NONE SEEN
Epithelial Cells (non renal): NONE SEEN /hpf (ref 0–10)
Renal Epithel, UA: NONE SEEN /hpf

## 2022-11-19 NOTE — Progress Notes (Signed)
Subjective:    Patient ID: Michelle Sellers, female    DOB: 1970-11-18, 52 y.o.   MRN: [id number]   Chief Complaint: Annual Exam    HPI:  Michelle Sellers is a 52 y.o. who identifies as a female who was assigned female at birth.   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 Annual physical exam Without PAP  2. Urinary problem Pressure in pelvis area, mainly when she urinates.  3. Primary hypertension No c/o chest pain, sob or headache. Doe snot check blood pressure at home. BP Readings from Last 3 Encounters:  11/19/22 115/79  08/05/22 122/86  06/10/22 114/75     4. Pure hypercholesterolemia Does not really watch diet and does no dedicated exercise. Lab Results  Component Value Date   CHOL 184 01/29/2022   HDL 43 01/29/2022   LDLCALC 120 (H) 01/29/2022   TRIG 115 01/29/2022   CHOLHDL 4.3 01/29/2022     5. Anxiety Doing well. On no meds  6. BMI 30.0-30.9,adult Weight is down 4lbs Wt Readings from Last 3 Encounters:  11/19/22 164 lb (74.4 kg)  08/05/22 168 lb (76.2 kg)  06/10/22 164 lb (74.4 kg)   BMI Readings from Last 3 Encounters:  11/19/22 28.15 kg/m  08/05/22 28.39 kg/m  06/10/22 27.29 kg/m      New complaints: None today  No Known Allergies Outpatient Encounter Medications as of 11/19/2022  Medication Sig   fexofenadine (ALLEGRA) 180 MG tablet Take 180 mg by mouth daily.   hydrochlorothiazide (HYDRODIURIL) 25 MG tablet Take 1 tablet (25 mg total) by mouth daily.   [DISCONTINUED] fluconazole (DIFLUCAN) 150 MG tablet Take one tablet by mouth now. May repeat in 3 days if symptoms persist.   No facility-administered encounter medications on file as of 11/19/2022.    Past Surgical History:  Procedure Laterality Date   DILITATION & CURRETTAGE/HYSTROSCOPY WITH NOVASURE ABLATION N/A 07/21/2016   Procedure:  DILATATION & CURETTAGE/HYSTEROSCOPY WITH NOVASURE ENDOMETRIAL ABLATION;  Surgeon: Jonnie Kind, MD;  Location: [facility];  Service: Gynecology;  Laterality: N/A;   EXTRACORPOREAL SHOCK WAVE LITHOTRIPSY Right 03/01/2017   Procedure: RIGHT EXTRACORPOREAL SHOCK WAVE LITHOTRIPSY (ESWL);  Surgeon: Festus Aloe, MD;  Location: [facility];  Service: Urology;  Laterality: Right;   EXTRACORPOREAL SHOCK WAVE LITHOTRIPSY Right 04/05/2017   Procedure: RIGHT EXTRACORPOREAL SHOCK WAVE LITHOTRIPSY (ESWL);  Surgeon: Raynelle Bring, MD;  Location: [facility];  Service: Urology;  Laterality: Right;   POLYPECTOMY N/A 07/21/2016   Procedure: REMOVAL OF ENDOMETRIAL POLYP;  Surgeon: Jonnie Kind, MD;  Location: [facility];  Service: Gynecology;  Laterality: N/A;   WISDOM TOOTH EXTRACTION      Family History  Problem Relation Age of Onset   Hyperlipidemia Mother    Hypertension Mother    Cancer Mother        breast   Breast cancer Mother    Heart disease Father    Hyperlipidemia Father    Hypertension Father    Hyperlipidemia Maternal Grandmother    Breast cancer Maternal Grandmother    Hyperlipidemia Maternal Grandfather    Hyperlipidemia Paternal Grandmother    Hyperlipidemia Paternal Grandfather       Controlled substance contract: n/a     Review of Systems  Constitutional:  Negative for diaphoresis.  Eyes:  Negative for pain.  Respiratory:  Negative for shortness of breath.   Cardiovascular:  Negative for  chest pain, palpitations and leg swelling.  Gastrointestinal:  Negative for abdominal pain.  Endocrine: Negative for polydipsia.  Skin:  Negative for rash.  Neurological:  Negative for dizziness, weakness and headaches.  Hematological:  Does not bruise/bleed easily.  All other systems reviewed and are negative.      Objective:   Physical Exam Vitals and nursing note reviewed.  Constitutional:      General: She is not in acute distress.    Appearance: Normal appearance. She is  well-developed.  HENT:     Head: Normocephalic.     Right Ear: Tympanic membrane normal.     Left Ear: Tympanic membrane normal.     Nose: Nose normal.     Mouth/Throat:     Mouth: Mucous membranes are moist.  Eyes:     Pupils: Pupils are equal, round, and reactive to light.  Neck:     Vascular: No carotid bruit or JVD.  Cardiovascular:     Rate and Rhythm: Normal rate and regular rhythm.     Heart sounds: Normal heart sounds.  Pulmonary:     Effort: Pulmonary effort is normal. No respiratory distress.     Breath sounds: Normal breath sounds. No wheezing or rales.  Chest:     Chest wall: No tenderness.  Abdominal:     General: Bowel sounds are normal. There is no distension or abdominal bruit.     Palpations: Abdomen is soft. There is no hepatomegaly, splenomegaly, mass or pulsatile mass.     Tenderness: There is no abdominal tenderness.  Musculoskeletal:        General: Normal range of motion.     Cervical back: Normal range of motion and neck supple.  Lymphadenopathy:     Cervical: No cervical adenopathy.  Skin:    General: Skin is warm and dry.  Neurological:     Mental Status: She is alert and oriented to person, place, and time.     Deep Tendon Reflexes: Reflexes are normal and symmetric.  Psychiatric:        Behavior: Behavior normal.        Thought Content: Thought content normal.        Judgment: Judgment normal.    BP 115/79   Pulse 70   Temp (!) 96.8 F (36 C) (Temporal)   Resp 20   Ht '5\' 4"'$  (1.626 m)   Wt 164 lb (74.4 kg)   SpO2 97%   BMI 28.15 kg/m   KUB- moderate amount of stool burden-Preliminary reading by Ronnald Collum, FNP  [facility]   Chest xray- normal-Preliminary reading by Ronnald Collum, FNP  [facility]  Adella Nissen, FNP   Urine clear      Assessment & Plan:   Michelle Sellers comes in today with chief complaint of Annual Exam   Diagnosis and orders addressed:  1. Annual physical exam  2. Urinary problem Lab normal -  Urinalysis, Complete - Urine Culture  3. Primary hypertension Low sodium diet - DG Chest 2 View - EKG 12-Lead  4. Pure hypercholesterolemia Low fat diet  5. Anxiety Stress management  6. BMI 30.0-30.9,adult Discussed diet and exercise for person with BMI >25 Will recheck weight in 3-6 months   7. LLQ pain Constipation Increase fiber in diet Force fluids MOM and prune juice OTC-then after goodresults, miralax daily If no better will need to do U/S - DG Abd 1 View   Labs pending Health Maintenance reviewed Diet and exercise encouraged  Follow up plan: prn  Mary-Margaret Hassell Done, FNP

## 2022-11-19 NOTE — Patient Instructions (Signed)

## 2022-11-20 LAB — CMP14+EGFR
ALT: 12 IU/L (ref 0–32)
AST: 11 IU/L (ref 0–40)
Albumin/Globulin Ratio: 2.3 — ABNORMAL HIGH (ref 1.2–2.2)
Albumin: 4.4 g/dL (ref 3.8–4.9)
Alkaline Phosphatase: 80 IU/L (ref 44–121)
BUN/Creatinine Ratio: 20 (ref 9–23)
BUN: 18 mg/dL (ref 6–24)
Bilirubin Total: 0.5 mg/dL (ref 0.0–1.2)
CO2: 23 mmol/L (ref 20–29)
Calcium: 9.2 mg/dL (ref 8.7–10.2)
Chloride: 106 mmol/L (ref 96–106)
Creatinine, Ser: 0.92 mg/dL (ref 0.57–1.00)
Globulin, Total: 1.9 g/dL (ref 1.5–4.5)
Glucose: 100 mg/dL — ABNORMAL HIGH (ref 70–99)
Potassium: 4 mmol/L (ref 3.5–5.2)
Sodium: 142 mmol/L (ref 134–144)
Total Protein: 6.3 g/dL (ref 6.0–8.5)
eGFR: 74 mL/min/{1.73_m2} (ref >59)

## 2022-11-20 LAB — THYROID PANEL WITH TSH
Free Thyroxine Index: 2.2 (ref 1.2–4.9)
T3 Uptake Ratio: 29 % (ref 24–39)
T4, Total: 7.5 ug/dL (ref 4.5–12.0)
TSH: 0.744 u[IU]/mL (ref 0.450–4.500)

## 2022-11-20 LAB — CBC WITH DIFFERENTIAL/PLATELET
Basophils Absolute: 0.1 10*3/uL (ref 0.0–0.2)
Basos: 1 %
EOS (ABSOLUTE): 0 10*3/uL (ref 0.0–0.4)
Eos: 0 %
Hematocrit: 37.2 % (ref 34.0–46.6)
Hemoglobin: 12.6 g/dL (ref 11.1–15.9)
Immature Grans (Abs): 0 10*3/uL (ref 0.0–0.1)
Immature Granulocytes: 0 %
Lymphocytes Absolute: 1.9 10*3/uL (ref 0.7–3.1)
Lymphs: 26 %
MCH: 30.3 pg (ref 26.6–33.0)
MCHC: 33.9 g/dL (ref 31.5–35.7)
MCV: 89 fL (ref 79–97)
Monocytes Absolute: 0.5 10*3/uL (ref 0.1–0.9)
Monocytes: 7 %
Neutrophils Absolute: 4.8 10*3/uL (ref 1.4–7.0)
Neutrophils: 66 %
Platelets: 182 10*3/uL (ref 150–450)
RBC: 4.16 x10E6/uL (ref 3.77–5.28)
RDW: 12.9 % (ref 11.7–15.4)
WBC: 7.3 10*3/uL (ref 3.4–10.8)

## 2022-11-20 LAB — LIPID PANEL
Chol/HDL Ratio: 4.9 ratio — ABNORMAL HIGH (ref 0.0–4.4)
Cholesterol, Total: 188 mg/dL (ref 100–199)
HDL: 38 mg/dL — ABNORMAL LOW (ref >39)
LDL Chol Calc (NIH): 117 mg/dL — ABNORMAL HIGH (ref 0–99)
Triglycerides: 187 mg/dL — ABNORMAL HIGH (ref 0–149)
VLDL Cholesterol Cal: 33 mg/dL (ref 5–40)

## 2022-11-20 LAB — VITAMIN B12: Vitamin B-12: 516 pg/mL (ref 232–1245)

## 2022-11-20 LAB — VITAMIN D 25 HYDROXY (VIT D DEFICIENCY, FRACTURES): Vit D, 25-Hydroxy: 14.4 ng/mL — ABNORMAL LOW (ref 30.0–100.0)

## 2022-11-20 MED ORDER — VITAMIN D (ERGOCALCIFEROL) 1.25 MG (50000 UNIT) PO CAPS: 50000.0000 [IU] | ORAL_CAPSULE | ORAL | 3 refills | Status: DC

## 2022-11-20 NOTE — Addendum Note (Signed)
Addended by: Chevis Pretty on: 11/20/2022 11:34 AM   Modules accepted: Orders

## 2022-11-21 LAB — URINE CULTURE

## 2023-03-23 ENCOUNTER — Ambulatory Visit: Payer: BC Managed Care – PPO | Admitting: Nurse Practitioner

## 2023-03-23 ENCOUNTER — Encounter: Payer: Self-pay | Admitting: Nurse Practitioner

## 2023-03-23 VITALS — BP 122/92 | HR 79 | Temp 97.7°F | Resp 20 | Ht 64.0 in | Wt 159.0 lb

## 2023-03-23 DIAGNOSIS — N3001 Acute cystitis with hematuria: Secondary | ICD-10-CM | POA: Diagnosis not present

## 2023-03-23 DIAGNOSIS — R319 Hematuria, unspecified: Secondary | ICD-10-CM | POA: Diagnosis not present

## 2023-03-23 LAB — URINALYSIS, COMPLETE
Bilirubin, UA: NEGATIVE
Glucose, UA: NEGATIVE
Nitrite, UA: POSITIVE — AB
Specific Gravity, UA: >1.03 — ABNORMAL HIGH (ref 1.005–1.030)
Urobilinogen, Ur: 1 mg/dL (ref 0.2–1.0)
pH, UA: 5.5 (ref 5.0–7.5)

## 2023-03-23 LAB — MICROSCOPIC EXAMINATION: RBC, Urine: >30 /hpf — AB (ref 0–2)

## 2023-03-23 MED ORDER — SULFAMETHOXAZOLE-TRIMETHOPRIM 800-160 MG PO TABS: 1.0000 | ORAL_TABLET | Freq: Two times a day (BID) | ORAL | 0 refills | Status: DC

## 2023-03-23 NOTE — Patient Instructions (Signed)
 Take medication as prescribe Cotton underwear Take shower not bath Cranberry juice, yogurt Force fluids AZO over the counter X2 days Culture pending RTO prn  

## 2023-03-23 NOTE — Progress Notes (Signed)
   Subjective:    Patient ID: [name], female    DOB: 08-01-1971, 52 y.o.   MRN: [id number]   Chief Complaint: Back Pain and Hematuria   Urinary Tract Infection  This is a new problem. The current episode started 1 to 4 weeks ago. The problem occurs intermittently. The problem has been gradually worsening. The quality of the pain is described as burning. The pain is at a severity of 5/10. The pain is mild. There is No history of pyelonephritis. Associated symptoms include flank pain, frequency, hematuria and urgency. She has tried nothing for the symptoms.    Patient Active Problem List   Diagnosis Date Noted   Anxiety 02/06/2021   Hot flashes 10/25/2019   Menopause 10/25/2019   Mass of upper inner quadrant of left breast 08/22/2019   Vaginal dryness 12/15/2017   BMI 30.0-30.9,adult 08/06/2015   Primary hypertension 06/14/2014   Hyperlipidemia 08/02/2013   Allergic rhinitis 12/03/2012       Review of Systems  Genitourinary:  Positive for flank pain, frequency, hematuria and urgency.       Objective:   Physical Exam Constitutional:      Appearance: Normal appearance.  Cardiovascular:     Rate and Rhythm: Normal rate and regular rhythm.     Heart sounds: Normal heart sounds.  Pulmonary:     Breath sounds: Normal breath sounds.  Abdominal:     General: Abdomen is flat.     Tenderness: There is abdominal tenderness (mild suprapubic pain on palpation). There is no right CVA tenderness, left CVA tenderness or guarding.  Skin:    General: Skin is warm.  Neurological:     General: No focal deficit present.     Mental Status: She is alert and oriented to person, place, and time.  Psychiatric:        Mood and Affect: Mood normal.        Behavior: Behavior normal.    BP (!) 122/92   Pulse 79   Temp 97.7 F (36.5 C) (Temporal)   Resp 20   Ht 5\' 4"  (1.626 m)   Wt 159 lb (72.1 kg)   SpO2 98%   BMI 27.29 kg/m    Positive  nitrites in urine      Assessment & Plan:  Chace Galano in today with chief complaint of Back Pain and Hematuria   1. Hematuria, unspecified type - Urinalysis, Complete - Urine Culture  2. Acute cystitis with hematuria Take medication as prescribe Cotton underwear Take shower not bath Cranberry juice, yogurt Force fluids AZO over the counter X2 days Culture pending RTO prn  Meds ordered this encounter  Medications   sulfamethoxazole-trimethoprim (BACTRIM DS) 800-160 MG tablet    Sig: Take 1 tablet by mouth 2 (two) times daily.    Dispense:  20 tablet    Refill:  0    Order Specific Question:   Supervising Provider    Answer:   Arville Care A [[id number]]      The above assessment and management plan was discussed with the patient. The patient verbalized understanding of and has agreed to the management plan. Patient is aware to call the clinic if symptoms persist or worsen. Patient is aware when to return to the clinic for a follow-up visit. Patient educated on when it is appropriate to go to the emergency department.   Mary-Margaret Daphine Deutscher, FNP

## 2023-03-26 LAB — URINE CULTURE

## 2023-08-04 DIAGNOSIS — Z1231 Encounter for screening mammogram for malignant neoplasm of breast: Secondary | ICD-10-CM | POA: Diagnosis not present

## 2023-08-09 LAB — HM MAMMOGRAPHY

## 2023-08-11 ENCOUNTER — Encounter: Payer: Self-pay | Admitting: Adult Health

## 2023-08-11 ENCOUNTER — Ambulatory Visit: Payer: BC Managed Care – PPO | Admitting: Adult Health

## 2023-08-11 VITALS — BP 128/84 | HR 70 | Ht 65.0 in | Wt 164.5 lb

## 2023-08-11 DIAGNOSIS — Z01419 Encounter for gynecological examination (general) (routine) without abnormal findings: Secondary | ICD-10-CM | POA: Diagnosis not present

## 2023-08-11 DIAGNOSIS — Z1331 Encounter for screening for depression: Secondary | ICD-10-CM | POA: Diagnosis not present

## 2023-08-11 DIAGNOSIS — N390 Urinary tract infection, site not specified: Secondary | ICD-10-CM

## 2023-08-11 DIAGNOSIS — Z1211 Encounter for screening for malignant neoplasm of colon: Secondary | ICD-10-CM | POA: Insufficient documentation

## 2023-08-11 LAB — HEMOCCULT GUIAC POC 1CARD (OFFICE): Fecal Occult Blood, POC: NEGATIVE

## 2023-08-11 LAB — POCT URINALYSIS DIPSTICK
Blood, UA: NEGATIVE
Glucose, UA: NEGATIVE
Leukocytes, UA: NEGATIVE
Nitrite, UA: NEGATIVE
Protein, UA: NEGATIVE

## 2023-08-11 NOTE — Progress Notes (Signed)
Patient ID: Michelle Sellers, female   DOB: March 06, 1971, 52 y.o.   MRN: [id number] History of Present Illness: Michelle Sellers is 52 year old female, married, G2P2 in for a well woman gyn exam. She is having frequent UTIs, has history of kidney stones too.     Component Value Date/Time   DIAGPAP  08/05/2022 0919    - Negative for intraepithelial lesion or malignancy (NILM)   DIAGPAP  10/25/2019 0832    - Negative for intraepithelial lesion or malignancy (NILM)   HPVHIGH Negative 08/05/2022 0919   HPVHIGH Negative 10/25/2019 0832   ADEQPAP  08/05/2022 0919    Satisfactory for evaluation; transformation zone component PRESENT.   ADEQPAP  10/25/2019 0832    Satisfactory for evaluation; transformation zone component PRESENT.    PCP is Michelle Pierini NP   Current Medications, Allergies, Past Medical History, Past Surgical History, Family History and Social History were reviewed in [name].     Review of Systems:  Patient denies any headaches, hearing loss, fatigue, blurred vision, shortness of breath, chest pain, abdominal pain, problems with bowel movements, urination, or intercourse. No joint pain or mood swings.  See HPI for positives   Physical Exam:BP 128/84 (BP Location: Left Arm, Patient Position: Sitting, Cuff Size: Normal)   Pulse 70   Ht 5\' 5"  (1.651 m)   Wt 164 lb 8 oz (74.6 kg)   BMI 27.37 kg/m  urine dipstick was negative General:  Well developed, well nourished, no acute distress Skin:  Warm and dry Neck:  Midline trachea, normal thyroid, good ROM, no lymphadenopathy Lungs; Clear to auscultation bilaterally Breast:  No dominant palpable mass, retraction, or nipple discharge Cardiovascular: Regular rate and rhythm Abdomen:  Soft, non tender, no hepatosplenomegaly Pelvic:  External genitalia is normal in appearance, no lesions.  The vagina is pale. Urethra has no lesions or masses. The cervix is smooth.  Uterus is felt to be normal size,  shape, and contour.  No adnexal masses or tenderness noted.Bladder is non tender, no masses felt. Rectal: Good sphincter tone, no polyps, or hemorrhoids felt.  Hemoccult negative. Extremities/musculoskeletal:  No swelling or varicosities noted, no clubbing or cyanosis Psych:  No mood changes, alert and cooperative,seems happy AA is 2 Fall risk is low    08/11/2023   10:14 AM 03/23/2023    2:45 PM 11/19/2022    2:21 PM  Depression screen PHQ 2/9  Decreased Interest 1 0 0  Down, Depressed, Hopeless 1 0 0  PHQ - 2 Score 2 0 0  Altered sleeping 2 0 0  Tired, decreased energy 1 0 0  Change in appetite 0 0 0  Feeling bad or failure about yourself  0 0 0  Trouble concentrating 0 0 0  Moving slowly or fidgety/restless 0 0 0  Suicidal thoughts 0 0 0  PHQ-9 Score 5 0 0  Difficult doing work/chores  Not difficult at all Not difficult at all       08/11/2023   10:14 AM 03/23/2023    2:46 PM 11/19/2022    2:21 PM 08/05/2022    9:17 AM  GAD 7 : Generalized Anxiety Score  Nervous, Anxious, on Edge 1 0 0 1  Control/stop worrying 1 0 0 1  Worry too much - different things 0 0 0 0  Trouble relaxing 0 0 0 0  Restless 1 0 0 0  Easily annoyed or irritable 0 0 0 1  Afraid - awful might happen 0 0 0  0  Total GAD 7 Score 3 0 0 3  Anxiety Difficulty  Not difficult at all Not difficult at all     Upstream - 08/11/23 1019       Pregnancy Intention Screening   Does the patient want to become pregnant in the next year? N/A    Does the patient's partner want to become pregnant in the next year? N/A    Would the patient like to discuss contraceptive options today? N/A      Contraception Wrap Up   Current Method No Method - Other Reason   ablation   End Method No Method - Other Reason   ablation   Contraception Counseling Provided No              Examination chaperoned by Malachy Mood LPN  Impression and Plan: 1. Encounter for well woman exam with routine gynecological exam Pap in  2026 Physical in 1 year Labs with PCP M[facility] was [facility]  Needs to do cologuard or colonoscopy  2. Encounter for screening fecal occult blood testing Hemoccult was negative - POCT occult blood stool  3. Frequent UTI Has vaginal estrogen cream at home, try using pea sized amount 2-3 x weekly Urine dipstick was negative  - POCT urinalysis dipstick

## 2023-08-24 ENCOUNTER — Encounter: Payer: Self-pay | Admitting: Nurse Practitioner

## 2023-08-28 DIAGNOSIS — L03032 Cellulitis of left toe: Secondary | ICD-10-CM | POA: Diagnosis not present

## 2023-08-28 DIAGNOSIS — B351 Tinea unguium: Secondary | ICD-10-CM | POA: Diagnosis not present

## 2023-09-21 ENCOUNTER — Ambulatory Visit: Payer: BC Managed Care – PPO | Admitting: Nurse Practitioner

## 2023-09-21 ENCOUNTER — Encounter: Payer: Self-pay | Admitting: Nurse Practitioner

## 2023-09-21 VITALS — BP 136/88 | HR 52 | Temp 96.9°F | Ht 65.0 in | Wt 160.0 lb

## 2023-09-21 DIAGNOSIS — Z0001 Encounter for general adult medical examination with abnormal findings: Secondary | ICD-10-CM | POA: Diagnosis not present

## 2023-09-21 DIAGNOSIS — F419 Anxiety disorder, unspecified: Secondary | ICD-10-CM

## 2023-09-21 DIAGNOSIS — Z0000 Encounter for general adult medical examination without abnormal findings: Secondary | ICD-10-CM

## 2023-09-21 DIAGNOSIS — I10 Essential (primary) hypertension: Secondary | ICD-10-CM | POA: Diagnosis not present

## 2023-09-21 DIAGNOSIS — Z6830 Body mass index (BMI) 30.0-30.9, adult: Secondary | ICD-10-CM

## 2023-09-21 DIAGNOSIS — R5383 Other fatigue: Secondary | ICD-10-CM | POA: Diagnosis not present

## 2023-09-21 DIAGNOSIS — E7800 Pure hypercholesterolemia, unspecified: Secondary | ICD-10-CM | POA: Diagnosis not present

## 2023-09-21 LAB — LIPID PANEL

## 2023-09-21 MED ORDER — HYDROCHLOROTHIAZIDE 25 MG PO TABS: 25.0000 mg | ORAL_TABLET | Freq: Every day | ORAL | 1 refills | Status: DC

## 2023-09-21 NOTE — Progress Notes (Signed)
 Subjective:    Patient ID: Michelle Sellers, female    DOB: 11/26/1970, 53 y.o.   MRN: [id number]   Chief Complaint: .annual physical   HPI:  Michelle Sellers is a 53 y.o. who identifies as a female who was assigned female at birth.   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 Annual physical exam No PAP- menopausal  2. Primary hypertension No c/o chest pain, sob or headache. Does not check blood pressure at home. BP Readings from Last 3 Encounters:  08/11/23 128/84  03/23/23 (!) 122/92  11/19/22 115/79     3. Pure hypercholesterolemia Does try to watch diet nut does no dedicated exercise.  4. Anxiety    08/11/2023   10:14 AM 03/23/2023    2:46 PM 11/19/2022    2:21 PM 08/05/2022    9:17 AM  GAD 7 : Generalized Anxiety Score  Nervous, Anxious, on Edge 1 0 0 1  Control/stop worrying 1 0 0 1  Worry too much - different things 0 0 0 0  Trouble relaxing 0 0 0 0  Restless 1 0 0 0  Easily annoyed or irritable 0 0 0 1  Afraid - awful might happen 0 0 0 0  Total GAD 7 Score 3 0 0 3  Anxiety Difficulty  Not difficult at all Not difficult at all        09/21/2023    9:52 AM 08/11/2023   10:14 AM 03/23/2023    2:45 PM  Depression screen PHQ 2/9  Decreased Interest 0 1 0  Down, Depressed, Hopeless 0 1 0  PHQ - 2 Score 0 2 0  Altered sleeping  2 0  Tired, decreased energy  1 0  Change in appetite  0 0  Feeling bad or failure about yourself   0 0  Trouble concentrating  0 0  Moving slowly or fidgety/restless  0 0  Suicidal thoughts  0 0  PHQ-9 Score  5 0  Difficult doing work/chores   Not difficult at all     5. BMI 30.0-30.9,adult Weight is down 4 lbs Wt Readings from Last 3 Encounters:  09/21/23 160 lb (72.6 kg)  08/11/23 164 lb 8 oz (74.6 kg)  03/23/23 159 lb (72.1 kg)   BMI Readings from Last 3 Encounters:   09/21/23 26.63 kg/m  08/11/23 27.37 kg/m  03/23/23 27.29 kg/m      New complaints: Only complaint today is fatigue. She just has no energy  No Known Allergies Outpatient Encounter Medications as of 09/21/2023  Medication Sig   fexofenadine (ALLEGRA) 180 MG tablet Take 180 mg by mouth daily.   hydrochlorothiazide  (HYDRODIURIL ) 25 MG tablet Take 1 tablet (25 mg total) by mouth daily. (Patient taking differently: Take 25 mg by mouth.)   No facility-administered encounter medications on file as of 09/21/2023.    Past Surgical History:  Procedure Laterality Date   DILITATION & CURRETTAGE/HYSTROSCOPY WITH NOVASURE ABLATION N/A 07/21/2016   Procedure: DILATATION & CURETTAGE/HYSTEROSCOPY WITH NOVASURE ENDOMETRIAL ABLATION;  Surgeon: Norleen LULLA Server, MD;  Location: [facility];  Service: Gynecology;  Laterality: N/A;   EXTRACORPOREAL SHOCK WAVE LITHOTRIPSY Right 03/01/2017   Procedure: RIGHT EXTRACORPOREAL SHOCK WAVE LITHOTRIPSY (ESWL);  Surgeon: Nieves Cough, MD;  Location: [facility];  Service: Urology;  Laterality: Right;   EXTRACORPOREAL SHOCK WAVE LITHOTRIPSY Right 04/05/2017   Procedure: RIGHT EXTRACORPOREAL SHOCK WAVE  LITHOTRIPSY (ESWL);  Surgeon: Renda Glance, MD;  Location: [facility];  Service: Urology;  Laterality: Right;   POLYPECTOMY N/A 07/21/2016   Procedure: REMOVAL OF ENDOMETRIAL POLYP;  Surgeon: Norleen LULLA Server, MD;  Location: [facility];  Service: Gynecology;  Laterality: N/A;   WISDOM TOOTH EXTRACTION      Family History  Problem Relation Age of Onset   Hyperlipidemia Mother    Hypertension Mother    Cancer Mother        breast   Breast cancer Mother    Heart disease Father    Hyperlipidemia Father    Hypertension Father    Hyperlipidemia Maternal Grandmother    Breast cancer Maternal Grandmother    Hyperlipidemia Maternal Grandfather    Hyperlipidemia Paternal Grandmother    Hyperlipidemia Paternal Grandfather       Controlled substance contract:  n/a     Review of Systems  Constitutional:  Negative for diaphoresis.  Eyes:  Negative for pain.  Respiratory:  Negative for shortness of breath.   Cardiovascular:  Negative for chest pain, palpitations and leg swelling.  Gastrointestinal:  Negative for abdominal pain.  Endocrine: Negative for polydipsia.  Skin:  Negative for rash.  Neurological:  Negative for dizziness, weakness and headaches.  Hematological:  Does not bruise/bleed easily.  All other systems reviewed and are negative.      Objective:   Physical Exam Vitals and nursing note reviewed.  Constitutional:      General: She is not in acute distress.    Appearance: Normal appearance. She is well-developed.  HENT:     Head: Normocephalic.     Right Ear: Tympanic membrane normal.     Left Ear: Tympanic membrane normal.     Nose: Nose normal.     Mouth/Throat:     Mouth: Mucous membranes are moist.  Eyes:     Pupils: Pupils are equal, round, and reactive to light.  Neck:     Vascular: No carotid bruit or JVD.  Cardiovascular:     Rate and Rhythm: Normal rate and regular rhythm.     Heart sounds: Normal heart sounds.  Pulmonary:     Effort: Pulmonary effort is normal. No respiratory distress.     Breath sounds: Normal breath sounds. No wheezing or rales.  Chest:     Chest wall: No tenderness.  Abdominal:     General: Bowel sounds are normal. There is no distension or abdominal bruit.     Palpations: Abdomen is soft. There is no hepatomegaly, splenomegaly, mass or pulsatile mass.     Tenderness: There is no abdominal tenderness.  Musculoskeletal:        General: Normal range of motion.     Cervical back: Normal range of motion and neck supple.  Lymphadenopathy:     Cervical: No cervical adenopathy.  Skin:    General: Skin is warm and dry.  Neurological:     Mental Status: She is alert and oriented to person, place, and time.     Deep Tendon Reflexes: Reflexes are normal and symmetric.  Psychiatric:         Behavior: Behavior normal.        Thought Content: Thought content normal.        Judgment: Judgment normal.    BP 136/88   Pulse (!) 52   Temp (!) 96.9 F (36.1 C) (Temporal)   Ht 5' 5 (1.651 m)   Wt 160 lb (72.6 kg)   SpO2 97%   BMI 26.63  kg/m         Assessment & Plan:  Michelle Sellers comes in today with chief complaint of Annual Exam (No pap/)   Diagnosis and orders addressed:  1. Annual physical exam (Primary)  2. Primary hypertension Low sodium diet - CBC with Differential/Platelet - CMP14+EGFR - hydrochlorothiazide  (HYDRODIURIL ) 25 MG tablet; Take 1 tablet (25 mg total) by mouth daily.  Dispense: 90 tablet; Refill: 1  3. Pure hypercholesterolemia Low fat diet - Lipid panel  4. Anxiety Stress management  5. BMI 30.0-30.9,adult Discussed diet and exercise for person with BMI >25 Will recheck weight in 3-6 months   6. Other fatigue Labs pending - Thyroid  Panel With TSH - Vitamin B12 - VITAMIN D  25 Hydroxy (Vit-D Deficiency, Fractures)   Labs pending Health Maintenance reviewed Diet and exercise encouraged  Follow up plan: 6 months   Mary-Margaret Gladis, FNP

## 2023-09-21 NOTE — Patient Instructions (Signed)
 Exercising to Stay Healthy To become healthy and stay healthy, it is recommended that you do moderate-intensity and vigorous-intensity exercise. You can tell that you are exercising at a moderate intensity if your heart starts beating faster and you start breathing faster but can still hold a conversation. You can tell that you are exercising at a vigorous intensity if you are breathing much harder and faster and cannot hold a conversation while exercising. How can exercise benefit me? Exercising regularly is important. It has many health benefits, such as: Improving overall fitness, flexibility, and endurance. Increasing bone density. Helping with weight control. Decreasing body fat. Increasing muscle strength and endurance. Reducing stress and tension, anxiety, depression, or anger. Improving overall health. What guidelines should I follow while exercising? Before you start a new exercise program, talk with your health care provider. Do not exercise so much that you hurt yourself, feel dizzy, or get very short of breath. Wear comfortable clothes and wear shoes with good support. Drink plenty of water while you exercise to prevent dehydration or heat stroke. Work out until your breathing and your heartbeat get faster (moderate intensity). How often should I exercise? Choose an activity that you enjoy, and set realistic goals. Your health care provider can help you make an activity plan that is individually designed and works best for you. Exercise regularly as told by your health care provider. This may include: Doing strength training two times a week, such as: Lifting weights. Using resistance bands. Push-ups. Sit-ups. Yoga. Doing a certain intensity of exercise for a given amount of time. Choose from these options: A total of 150 minutes of moderate-intensity exercise every week. A total of 75 minutes of vigorous-intensity exercise every week. A mix of moderate-intensity and  vigorous-intensity exercise every week. Children, pregnant women, people who have not exercised regularly, people who are overweight, and older adults may need to talk with a health care provider about what activities are safe to perform. If you have a medical condition, be sure to talk with your health care provider before you start a new exercise program. What are some exercise ideas? Moderate-intensity exercise ideas include: Walking 1 mile (1.6 km) in about 15 minutes. Biking. Hiking. Golfing. Dancing. Water aerobics. Vigorous-intensity exercise ideas include: Walking 4.5 miles (7.2 km) or more in about 1 hour. Jogging or running 5 miles (8 km) in about 1 hour. Biking 10 miles (16.1 km) or more in about 1 hour. Lap swimming. Roller-skating or in-line skating. Cross-country skiing. Vigorous competitive sports, such as football, basketball, and soccer. Jumping rope. Aerobic dancing. What are some everyday activities that can help me get exercise? Yard work, such as: Child psychotherapist. Raking and bagging leaves. Washing your car. Pushing a stroller. Shoveling snow. Gardening. Washing windows or floors. How can I be more active in my day-to-day activities? Use stairs instead of an elevator. Take a walk during your lunch break. If you drive, park your car farther away from your work or school. If you take public transportation, get off one stop early and walk the rest of the way. Stand up or walk around during all of your indoor phone calls. Get up, stretch, and walk around every 30 minutes throughout the day. Enjoy exercise with a friend. Support to continue exercising will help you keep a regular routine of activity. Where to find more information You can find more information about exercising to stay healthy from: U.[place]. Department of Health and Human Services: ThisPath.fi Centers for Disease Control and Prevention (  CDC): FootballExhibition.com.br Summary Exercising regularly is  important. It will improve your overall fitness, flexibility, and endurance. Regular exercise will also improve your overall health. It can help you control your weight, reduce stress, and improve your bone density. Do not exercise so much that you hurt yourself, feel dizzy, or get very short of breath. Before you start a new exercise program, talk with your health care provider. This information is not intended to replace advice given to you by your health care provider. Make sure you discuss any questions you have with your health care provider. Document Revised: 12/20/2020 Document Reviewed: 12/20/2020 Elsevier Patient Education  2024 [employer].

## 2023-09-22 LAB — CBC WITH DIFFERENTIAL/PLATELET
Basophils Absolute: 0 10*3/uL (ref 0.0–0.2)
Basos: 1 %
EOS (ABSOLUTE): 0.1 10*3/uL (ref 0.0–0.4)
Eos: 1 %
Hematocrit: 39.2 % (ref 34.0–46.6)
Hemoglobin: 12.4 g/dL (ref 11.1–15.9)
Immature Grans (Abs): 0 10*3/uL (ref 0.0–0.1)
Immature Granulocytes: 0 %
Lymphocytes Absolute: 1.6 10*3/uL (ref 0.7–3.1)
Lymphs: 31 %
MCH: 28.8 pg (ref 26.6–33.0)
MCHC: 31.6 g/dL (ref 31.5–35.7)
MCV: 91 fL (ref 79–97)
Monocytes Absolute: 0.4 10*3/uL (ref 0.1–0.9)
Monocytes: 7 %
Neutrophils Absolute: 3.1 10*3/uL (ref 1.4–7.0)
Neutrophils: 60 %
Platelets: 167 10*3/uL (ref 150–450)
RBC: 4.3 x10E6/uL (ref 3.77–5.28)
RDW: 13.2 % (ref 11.7–15.4)
WBC: 5.1 10*3/uL (ref 3.4–10.8)

## 2023-09-22 LAB — LIPID PANEL
Cholesterol, Total: 189 mg/dL (ref 100–199)
HDL: 41 mg/dL (ref >39)
LDL CALC COMMENT:: 4.6 ratio — ABNORMAL HIGH (ref 0.0–4.4)
LDL Chol Calc (NIH): 126 mg/dL — ABNORMAL HIGH (ref 0–99)
Triglycerides: 122 mg/dL (ref 0–149)
VLDL Cholesterol Cal: 22 mg/dL (ref 5–40)

## 2023-09-22 LAB — CMP14+EGFR
ALT: 11 [IU]/L (ref 0–32)
AST: 11 [IU]/L (ref 0–40)
Albumin: 4.6 g/dL (ref 3.8–4.9)
Alkaline Phosphatase: 75 [IU]/L (ref 44–121)
BUN/Creatinine Ratio: 24 — ABNORMAL HIGH (ref 9–23)
BUN: 18 mg/dL (ref 6–24)
Bilirubin Total: 0.7 mg/dL (ref 0.0–1.2)
CO2: 23 mmol/L (ref 20–29)
Calcium: 9.3 mg/dL (ref 8.7–10.2)
Chloride: 104 mmol/L (ref 96–106)
Creatinine, Ser: 0.74 mg/dL (ref 0.57–1.00)
Globulin, Total: 2.1 g/dL (ref 1.5–4.5)
Glucose: 88 mg/dL (ref 70–99)
Potassium: 3.9 mmol/L (ref 3.5–5.2)
Sodium: 141 mmol/L (ref 134–144)
Total Protein: 6.7 g/dL (ref 6.0–8.5)
eGFR: 96 mL/min/{1.73_m2} (ref >59)

## 2023-09-22 LAB — THYROID PANEL WITH TSH
Free Thyroxine Index: 2.7 (ref 1.2–4.9)
T3 Uptake Ratio: 33 % (ref 24–39)
T4, Total: 8.2 ug/dL (ref 4.5–12.0)
TSH: 1.32 u[IU]/mL (ref 0.450–4.500)

## 2023-09-22 LAB — VITAMIN D 25 HYDROXY (VIT D DEFICIENCY, FRACTURES): Vit D, 25-Hydroxy: 18 ng/mL — ABNORMAL LOW (ref 30.0–100.0)

## 2023-09-22 LAB — VITAMIN B12: Vitamin B-12: 523 pg/mL (ref 232–1245)

## 2023-10-21 ENCOUNTER — Ambulatory Visit: Payer: BC Managed Care – PPO | Admitting: Nurse Practitioner

## 2023-10-23 DIAGNOSIS — J209 Acute bronchitis, unspecified: Secondary | ICD-10-CM | POA: Diagnosis not present

## 2024-02-01 ENCOUNTER — Encounter: Payer: Self-pay | Admitting: Nurse Practitioner

## 2024-03-06 ENCOUNTER — Ambulatory Visit: Admitting: Nurse Practitioner

## 2024-03-06 ENCOUNTER — Encounter: Payer: Self-pay | Admitting: Nurse Practitioner

## 2024-03-06 VITALS — BP 140/94 | HR 58 | Temp 97.0°F | Ht 65.0 in | Wt 167.0 lb

## 2024-03-06 DIAGNOSIS — E7800 Pure hypercholesterolemia, unspecified: Secondary | ICD-10-CM | POA: Diagnosis not present

## 2024-03-06 DIAGNOSIS — I10 Essential (primary) hypertension: Secondary | ICD-10-CM

## 2024-03-06 DIAGNOSIS — M79671 Pain in right foot: Secondary | ICD-10-CM

## 2024-03-06 DIAGNOSIS — J301 Allergic rhinitis due to pollen: Secondary | ICD-10-CM | POA: Diagnosis not present

## 2024-03-06 DIAGNOSIS — F419 Anxiety disorder, unspecified: Secondary | ICD-10-CM

## 2024-03-06 DIAGNOSIS — Z6830 Body mass index (BMI) 30.0-30.9, adult: Secondary | ICD-10-CM

## 2024-03-06 DIAGNOSIS — B3731 Acute candidiasis of vulva and vagina: Secondary | ICD-10-CM

## 2024-03-06 DIAGNOSIS — M79672 Pain in left foot: Secondary | ICD-10-CM

## 2024-03-06 LAB — LIPID PANEL

## 2024-03-06 MED ORDER — FLUCONAZOLE 150 MG PO TABS: ORAL_TABLET | ORAL | 0 refills | Status: AC

## 2024-03-06 MED ORDER — PREDNISONE 20 MG PO TABS: 40.0000 mg | ORAL_TABLET | Freq: Every day | ORAL | 0 refills | Status: AC

## 2024-03-06 MED ORDER — HYDROCHLOROTHIAZIDE 25 MG PO TABS
25.0000 mg | ORAL_TABLET | Freq: Every day | ORAL | 1 refills | Status: AC
Start: 2024-03-06 — End: ?

## 2024-03-06 NOTE — Progress Notes (Signed)
 Subjective:    Patient ID: Michelle Sellers, female    DOB: [birth year], 53 y.o.   MRN: [id number]   Chief Complaint: medical management of chronic issues     HPI:  Michelle Sellers is a 53 y.o. who identifies as a female who was assigned female at birth.   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 Primary hypertension No c/o chest pain, sob or headache. Does not check blood pressure at home. BP Readings from Last 3 Encounters:  09/21/23 136/88  08/11/23 128/84  03/23/23 (!) 122/92     2. Pure hypercholesterolemia Does try  to watch diet and stays very active. Does no dedicated exercise. Lab Results  Component Value Date   CHOL 189 09/21/2023   HDL 41 09/21/2023   LDLCALC 126 (H) 09/21/2023   TRIG 122 09/21/2023   CHOLHDL 4.6 (H) 09/21/2023   The 10-year ASCVD risk score (Michelle DK, et al., 2019) is: 3.5%   3. Seasonal allergic rhinitis due to pollen Is on flonase  daily and is doing well  4. Anxiety Is doing well.    08/11/2023   10:14 AM 03/23/2023    2:46 PM 11/19/2022    2:21 PM 08/05/2022    9:17 AM  GAD 7 : Generalized Anxiety Score  Nervous, Anxious, on Edge 1 0 0 1  Control/stop worrying 1 0 0 1  Worry too much - different things 0 0 0 0  Trouble relaxing 0 0 0 0  Restless 1 0 0 0  Easily annoyed or irritable 0 0 0 1  Afraid - awful might happen 0 0 0 0  Total GAD 7 Score 3 0 0 3  Anxiety Difficulty  Not difficult at all Not difficult at all       5. Menopause Is having occasional hot flashes.   6. BMI 30.0-30.9,adult Weight is up 7lbs  Wt Readings from Last 3 Encounters:  03/06/24 167 lb (75.8 kg)  09/21/23 160 lb (72.6 kg)  08/11/23 164 lb 8 oz (74.6 kg)   BMI Readings from Last 3 Encounters:  03/06/24 27.79 kg/m  09/21/23 26.63 kg/m  08/11/23 27.37 kg/m       New complaints: -Bil foot  pain. Has been going on for months. Worse when standing and walking. Mainly in left heel. Rates pain 7-8/10.  - frequent yeast infections in the summer.   No Known Allergies Outpatient Encounter Medications as of 03/06/2024  Medication Sig   fexofenadine (ALLEGRA) 180 MG tablet Take 180 mg by mouth daily.   hydrochlorothiazide  (HYDRODIURIL ) 25 MG tablet Take 1 tablet (25 mg total) by mouth daily.   No facility-administered encounter medications on file as of 03/06/2024.    Past Surgical History:  Procedure Laterality Date   DILITATION & CURRETTAGE/HYSTROSCOPY WITH NOVASURE ABLATION N/A 07/21/2016   Procedure: DILATATION & CURETTAGE/HYSTEROSCOPY WITH NOVASURE ENDOMETRIAL ABLATION;  Surgeon: Norleen LULLA Server, MD;  Location: [facility];  Service: Gynecology;  Laterality: N/A;   EXTRACORPOREAL SHOCK WAVE LITHOTRIPSY Right 03/01/2017   Procedure: RIGHT EXTRACORPOREAL SHOCK WAVE LITHOTRIPSY (ESWL);  Surgeon: Nieves Cough, MD;  Location: [facility];  Service: Urology;  Laterality: Right;   EXTRACORPOREAL SHOCK WAVE LITHOTRIPSY Right 04/05/2017   Procedure: RIGHT EXTRACORPOREAL SHOCK WAVE LITHOTRIPSY (ESWL);  Surgeon: Renda Glance, MD;  Location: [facility];  Service: Urology;  Laterality: Right;   POLYPECTOMY N/A 07/21/2016   Procedure: REMOVAL  OF ENDOMETRIAL POLYP;  Surgeon: Norleen LULLA Server, MD;  Location: [facility];  Service: Gynecology;  Laterality: N/A;   WISDOM TOOTH EXTRACTION      Family History  Problem Relation Age of Onset   Hyperlipidemia Mother    Hypertension Mother    Cancer Mother        breast   Breast cancer Mother    Heart disease Father    Hyperlipidemia Father    Hypertension Father    Hyperlipidemia Maternal Grandmother    Breast cancer Maternal Grandmother    Hyperlipidemia Maternal Grandfather    Hyperlipidemia Paternal Grandmother    Hyperlipidemia Paternal Grandfather       Controlled substance contract: n/a     Review of Systems  Constitutional:  Negative for  diaphoresis.  Eyes:  Negative for pain.  Respiratory:  Negative for shortness of breath.   Cardiovascular:  Negative for chest pain, palpitations and leg swelling.  Gastrointestinal:  Negative for abdominal pain.  Endocrine: Negative for polydipsia.  Skin:  Negative for rash.  Neurological:  Negative for dizziness, weakness and headaches.  Hematological:  Does not bruise/bleed easily.  All other systems reviewed and are negative.      Objective:   Physical Exam Vitals and nursing note reviewed.  Constitutional:      General: She is not in acute distress.    Appearance: Normal appearance. She is well-developed.  HENT:     Head: Normocephalic.     Right Ear: Tympanic membrane normal.     Left Ear: Tympanic membrane normal.     Nose: Nose normal.     Mouth/Throat:     Mouth: Mucous membranes are moist.   Eyes:     Pupils: Pupils are equal, round, and reactive to light.   Neck:     Vascular: No carotid bruit or JVD.   Cardiovascular:     Rate and Rhythm: Normal rate and regular rhythm.     Heart sounds: Normal heart sounds.  Pulmonary:     Effort: Pulmonary effort is normal. No respiratory distress.     Breath sounds: Normal breath sounds. No wheezing or rales.  Chest:     Chest wall: No tenderness.  Abdominal:     General: Bowel sounds are normal. There is no distension or abdominal bruit.     Palpations: Abdomen is soft. There is no hepatomegaly, splenomegaly, mass or pulsatile mass.     Tenderness: There is no abdominal tenderness.   Musculoskeletal:        General: Normal range of motion.     Cervical back: Normal range of motion and neck supple.  Lymphadenopathy:     Cervical: No cervical adenopathy.   Skin:    General: Skin is warm and dry.   Neurological:     Mental Status: She is alert and oriented to person, place, and time.     Deep Tendon Reflexes: Reflexes are normal and symmetric.   Psychiatric:        Behavior: Behavior normal.        Thought  Content: Thought content normal.        Judgment: Judgment normal.   BP (!) 140/94   Pulse (!) 58   Temp (!) 97 F (36.1 C) (Temporal)   Ht 5' 5 (1.651 m)   Wt 167 lb (75.8 kg)   SpO2 98%   BMI 27.79 kg/m       Assessment & Plan:   Michelle Sellers comes in today with chief  complaint of medical management of chronic issues    Diagnosis and orders addressed:  1. Primary hypertension Low sodium diet - hydrochlorothiazide  (HYDRODIURIL ) 25 MG tablet; Take 1 tablet (25 mg total) by mouth daily.  Dispense: 90 tablet; Refill: 1 - CBC with Differential/Platelet - CMP14+EGFR  2. Pure hypercholesterolemia Low fat diet - Lipid panel  3. Seasonal allergic rhinitis due to pollen Continue flonase   fluticasone  (FLONASE ) 50 MCG/ACT nasal spray; SPRAY 2 SPRAYS INTO EACH NOSTRIL EVERY DAY  Dispense: 16 mL; Refill: 6  4. Anxiety Stress management  5. Menopause Black kohash OTC as needed  6. BMI 30.0-30.9,adult Discussed diet and exercise for person with BMI >25 Will recheck weight in 3-6 months  7. Foot pain Go to good feet in [place] if helps  8. Vaginal candidiasis Diflucan  weekly for 4 weeks  Labs pending Health Maintenance reviewed Diet and exercise encouraged  Follow up plan: 6 months   Mary-Margaret Gladis, FNP

## 2024-03-06 NOTE — Patient Instructions (Signed)

## 2024-03-07 ENCOUNTER — Ambulatory Visit: Payer: Self-pay | Admitting: Nurse Practitioner

## 2024-03-07 LAB — CMP14+EGFR
ALT: 11 IU/L (ref 0–32)
AST: 13 IU/L (ref 0–40)
Albumin: 4.4 g/dL (ref 3.8–4.9)
Alkaline Phosphatase: 76 IU/L (ref 44–121)
BUN/Creatinine Ratio: 33 — AB (ref 9–23)
BUN: 26 mg/dL — AB (ref 6–24)
Bilirubin Total: 0.7 mg/dL (ref 0.0–1.2)
CO2: 22 mmol/L (ref 20–29)
Calcium: 9.6 mg/dL (ref 8.7–10.2)
Chloride: 107 mmol/L — AB (ref 96–106)
Creatinine, Ser: 0.78 mg/dL (ref 0.57–1.00)
Globulin, Total: 2.1 g/dL (ref 1.5–4.5)
Glucose: 94 mg/dL (ref 70–99)
Potassium: 4 mmol/L (ref 3.5–5.2)
Sodium: 143 mmol/L (ref 134–144)
Total Protein: 6.5 g/dL (ref 6.0–8.5)
eGFR: 90 mL/min/{1.73_m2} (ref >59)

## 2024-03-07 LAB — CBC WITH DIFFERENTIAL/PLATELET
Basophils Absolute: 0.1 10*3/uL (ref 0.0–0.2)
Basos: 1 %
EOS (ABSOLUTE): 0.1 10*3/uL (ref 0.0–0.4)
Eos: 1 %
Hematocrit: 40 % (ref 34.0–46.6)
Hemoglobin: 13.3 g/dL (ref 11.1–15.9)
Immature Grans (Abs): 0 10*3/uL (ref 0.0–0.1)
Immature Granulocytes: 0 %
Lymphocytes Absolute: 1.9 10*3/uL (ref 0.7–3.1)
Lymphs: 34 %
MCH: 30.4 pg (ref 26.6–33.0)
MCHC: 33.3 g/dL (ref 31.5–35.7)
MCV: 91 fL (ref 79–97)
Monocytes Absolute: 0.5 10*3/uL (ref 0.1–0.9)
Monocytes: 8 %
Neutrophils Absolute: 3.1 10*3/uL (ref 1.4–7.0)
Neutrophils: 56 %
Platelets: 165 10*3/uL (ref 150–450)
RBC: 4.38 x10E6/uL (ref 3.77–5.28)
RDW: 13 % (ref 11.7–15.4)
WBC: 5.6 10*3/uL (ref 3.4–10.8)

## 2024-03-07 LAB — LIPID PANEL
Cholesterol, Total: 209 mg/dL — AB (ref 100–199)
HDL: 41 mg/dL (ref >39)
LDL CALC COMMENT:: 5.1 ratio — AB (ref 0.0–4.4)
LDL Chol Calc (NIH): 145 mg/dL — AB (ref 0–99)
Triglycerides: 125 mg/dL (ref 0–149)
VLDL Cholesterol Cal: 23 mg/dL (ref 5–40)

## 2024-03-20 ENCOUNTER — Ambulatory Visit: Payer: BC Managed Care – PPO | Admitting: Nurse Practitioner

## 2024-09-11 ENCOUNTER — Ambulatory Visit: Payer: Self-pay | Admitting: Nurse Practitioner

## 2024-10-02 ENCOUNTER — Ambulatory Visit: Admitting: Nurse Practitioner

## 2024-10-02 ENCOUNTER — Ambulatory Visit: Payer: Self-pay

## 2024-10-02 NOTE — Telephone Encounter (Signed)
 FYI Only or Action Required?: FYI only for provider: appointment scheduled on 1/28.  Patient was last seen in primary care on 03/06/2024 by Gladis Mustard, FNP.  Called Nurse Triage reporting Hematuria and Abdominal Pain.  Symptoms began several days ago.  Interventions attempted: Nothing.  Symptoms are: gradually worsening.  Triage Disposition: See Physician Within 24 Hours  Patient/caregiver understands and will follow disposition?: Yes       Reason for Disposition  Blood in urine  (Exception: Could be normal menstrual bleeding.)  Answer Assessment - Initial Assessment Questions This RN recommended pt be examined in next 24 hours, scheduled for earliest pt would accept given weather conditions for 1/28 with PCP office. Advised call back or seek immediate care if new or worsening symptoms.     1. COLOR of URINE: Describe the color of the urine.  (e.g., tea-colored, pink, red, bloody) Do you have blood clots in your urine? (e.g., none, pea, grape, small coin)     Red but not dark red, confirms like diffuse with blood  2. ONSET: When did the bleeding start?      Few days ago  4. PAIN with URINATION: Is there any pain with passing your urine? If Yes, ask: How bad is the pain?  (Scale 1-10; or mild, moderate, severe)     Denies  5. FEVER: Do you have a fever? If Yes, ask: What is your temperature, how was it measured, and when did it start?     Denies  7. OTHER SYMPTOMS: Do you have any other symptoms? (e.g., back/flank pain, abdomen pain, vomiting)      Nausea Bloating Abdominal pain comes and goes, just uncomfortable not significant pain  Denies: Cold/clammy skin Too weak to stand Heart racing Injury Only drops of urine Diffuse muscle pain Passing pure blood or blood clots Back pain Fever Pain with urination  Pt reporting hx of kidney stones, thinks has one and thought would pass it by now but not, requesting appt for Wednesday later  morning per weather conditions  Protocols used: Urine - Blood In-A-AH

## 2024-10-03 NOTE — Telephone Encounter (Signed)
 Apt scheduled.

## 2024-10-04 ENCOUNTER — Ambulatory Visit: Admitting: Nurse Practitioner

## 2024-10-05 ENCOUNTER — Ambulatory Visit: Admitting: Nurse Practitioner

## 2024-10-06 ENCOUNTER — Ambulatory Visit (INDEPENDENT_AMBULATORY_CARE_PROVIDER_SITE_OTHER): Admitting: Nurse Practitioner

## 2024-10-06 ENCOUNTER — Encounter: Payer: Self-pay | Admitting: Nurse Practitioner

## 2024-10-06 ENCOUNTER — Ambulatory Visit: Payer: Self-pay | Admitting: Nurse Practitioner

## 2024-10-06 VITALS — BP 139/84 | HR 67 | Temp 97.5°F | Ht 65.0 in | Wt 166.0 lb

## 2024-10-06 DIAGNOSIS — R319 Hematuria, unspecified: Secondary | ICD-10-CM | POA: Diagnosis not present

## 2024-10-06 LAB — MICROSCOPIC EXAMINATION
RBC, Urine: NONE SEEN /HPF (ref 0–2)
Renal Epithel, UA: NONE SEEN /HPF
Yeast, UA: NONE SEEN

## 2024-10-06 LAB — URINALYSIS, ROUTINE W REFLEX MICROSCOPIC
Bilirubin, UA: NEGATIVE
Glucose, UA: NEGATIVE
Ketones, UA: NEGATIVE
Nitrite, UA: NEGATIVE
Protein,UA: NEGATIVE
RBC, UA: NEGATIVE
Specific Gravity, UA: 1.02 (ref 1.005–1.030)
Urobilinogen, Ur: 0.2 mg/dL (ref 0.2–1.0)
pH, UA: 7 (ref 5.0–7.5)

## 2024-10-06 MED ORDER — SULFAMETHOXAZOLE-TRIMETHOPRIM 800-160 MG PO TABS: 1.0000 | ORAL_TABLET | Freq: Two times a day (BID) | ORAL | 0 refills | Status: DC

## 2024-10-06 MED ORDER — SULFAMETHOXAZOLE-TRIMETHOPRIM 800-160 MG PO TABS: 1.0000 | ORAL_TABLET | Freq: Two times a day (BID) | ORAL | 0 refills | Status: AC

## 2024-10-06 NOTE — Addendum Note (Signed)
 Addended by: Toddy Boyd, MARY-MARGARET on: 10/06/2024 09:54 AM   Modules accepted: Orders

## 2024-10-06 NOTE — Progress Notes (Signed)
" ° °  Subjective:    Patient ID: Michelle Sellers, female    DOB: Sep 16, 1970, 54 y.o.   MRN: [id number]   Chief Complaint: blood in urine  Dysuria  This is a new problem. The current episode started in the past 7 days. The problem occurs intermittently. The problem has been waxing and waning. The pain is at a severity of 3/10. There has been no fever. She is Sexually active. Associated symptoms include frequency, hesitancy and urgency. She has tried acetaminophen  for the symptoms. The treatment provided mild relief.    Patient Active Problem List   Diagnosis Date Noted   Frequent UTI 08/11/2023   Anxiety 02/06/2021   Hot flashes 10/25/2019   Menopause 10/25/2019   Vaginal dryness 12/15/2017   BMI 30.0-30.9,adult 08/06/2015   Primary hypertension 06/14/2014   Hyperlipidemia 08/02/2013   Allergic rhinitis 12/03/2012       Review of Systems  Genitourinary:  Positive for dysuria, frequency, hesitancy and urgency.       Objective:   Physical Exam Constitutional:      Appearance: Normal appearance.  Cardiovascular:     Rate and Rhythm: Normal rate and regular rhythm.     Heart sounds: Normal heart sounds.  Pulmonary:     Effort: Pulmonary effort is normal.     Breath sounds: Normal breath sounds.  Skin:    General: Skin is warm.  Neurological:     General: No focal deficit present.     Mental Status: She is alert and oriented to person, place, and time.  Psychiatric:        Mood and Affect: Mood normal.        Behavior: Behavior normal.     BP 139/84   Pulse 67   Temp (!) 97.5 F (36.4 C) (Temporal)   Ht 5' 5 (1.651 m)   Wt 166 lb (75.3 kg)   SpO2 99%   BMI 27.62 kg/m        Assessment & Plan:   Eliani Pepperman in today with chief complaint of No chief complaint on file.   1. Hematuria, unspecified type (Primary) Take medication as prescribe Cotton underwear Take shower not bath Cranberry juice, yogurt Force fluids AZO over the counter X2  days Culture pending RTO prn  - Urinalysis, Routine w reflex microscopic - Urine Culture - sulfamethoxazole -trimethoprim  (BACTRIM  DS) 800-160 MG tablet; Take 1 tablet by mouth 2 (two) times daily.  Dispense: 20 tablet; Refill: 0    The above assessment and management plan was discussed with the patient. The patient verbalized understanding of and has agreed to the management plan. Patient is aware to call the clinic if symptoms persist or worsen. Patient is aware when to return to the clinic for a follow-up visit. Patient educated on when it is appropriate to go to the emergency department.   Mary-Margaret Gladis, FNP   "

## 2024-10-06 NOTE — Patient Instructions (Signed)
 UTI (Urinary Tract Infection) in Females: What to Know A urinary tract infection (UTI) is an infection in your urinary tract. The urinary tract is made up of organs that make, store, and get rid of pee (urine) in your body. These organs include: The kidneys. The ureters. The bladder. The urethra. What are the causes? Most UTIs are caused by germs called bacteria. They may be in or near your genitals. These germs grow and cause swelling in your urinary tract. What increases the risk? You're more likely to get a UTI if: You're a female. The urethra is shorter in females than in males. You have a soft tube called a catheter that drains your pee. You can't control when you pee or poop. You have trouble peeing because of: A kidney stone. A urinary blockage. A nerve condition that affects your bladder. Not getting enough to drink. You're sexually active. You use a birth control inside your vagina, like spermicide. You're pregnant. You have low levels of the hormone estrogen in your body. You're an older adult. You're also more likely to get a UTI if you have other health problems. These may include: Diabetes. A weak immune system. Your immune system is your body's defense system. Sickle cell disease. Injury of the spine. What are the signs or symptoms? Symptoms may include: Needing to pee right away. Peeing small amounts often. Pain or burning when you pee. Blood in your pee. Pee that smells bad or odd. Pain in your belly or lower back. You may also: Feel confused. This may be the first symptom in older adults. Vomit. Not feel hungry. Feel tired or easily annoyed. Have a fever or chills. How is this diagnosed? A UTI is diagnosed based on your medical history and an exam. You may also have other tests. These may include: Pee tests. Blood tests. Tests for sexually transmitted infections (STIs). If you've had more than one UTI, you may need to have imaging studies done to find  out why you keep getting them. How is this treated? A UTI can be treated by: Taking antibiotics or other medicines. Drinking enough fluid to keep your pee pale yellow. In rare cases, a UTI can cause a very bad condition called sepsis. Sepsis may be treated in the hospital. Follow these instructions at home: Medicines Take your medicines only as told by your health care provider. If you were given antibiotics, take them as told by your provider. Do not stop taking them even if you start to feel better. General instructions Make sure you: Pee often and fully. Do not hold your pee for a long time. Wipe from front to back after you pee or poop. Use each tissue only once when you wipe. Pee after you have sex. Do not douche or use sprays or powders in your genital area. Contact a health care provider if: Your symptoms don't get better after 1-2 days of taking antibiotics. Your symptoms go away and then come back. You have a fever or chills. You vomit or feel like you may vomit. Get help right away if: You have very bad pain in your back or lower belly. You faint. This information is not intended to replace advice given to you by your health care provider. Make sure you discuss any questions you have with your health care provider. Document Revised: 07/06/2024 Document Reviewed: 11/27/2022 [employer] Patient Education  2025 [employer].

## 2024-10-09 LAB — URINE CULTURE

## 2024-10-17 ENCOUNTER — Ambulatory Visit: Admitting: Nurse Practitioner

## 2024-11-22 ENCOUNTER — Ambulatory Visit: Admitting: Adult Health
